# Patient Record
Sex: Male | Born: 1970 | Race: Black or African American | Hispanic: No | Marital: Married | Smoking: Former smoker
Health system: Southern US, Community
[De-identification: ages and names within clinical notes are randomized; demographics above are authoritative.]

## PROBLEM LIST (undated history)

## (undated) DIAGNOSIS — I10 Essential (primary) hypertension: Secondary | ICD-10-CM

## (undated) DIAGNOSIS — G35 Multiple sclerosis: Secondary | ICD-10-CM

## (undated) DIAGNOSIS — M542 Cervicalgia: Secondary | ICD-10-CM

## (undated) DIAGNOSIS — F32A Depression, unspecified: Secondary | ICD-10-CM

## (undated) DIAGNOSIS — S065XAA Traumatic subdural hemorrhage with loss of consciousness status unknown, initial encounter: Secondary | ICD-10-CM

## (undated) DIAGNOSIS — F329 Major depressive disorder, single episode, unspecified: Secondary | ICD-10-CM

## (undated) DIAGNOSIS — Q761 Klippel-Feil syndrome: Secondary | ICD-10-CM

## (undated) DIAGNOSIS — D509 Iron deficiency anemia, unspecified: Secondary | ICD-10-CM

## (undated) DIAGNOSIS — G709 Myoneural disorder, unspecified: Secondary | ICD-10-CM

## (undated) DIAGNOSIS — G5620 Lesion of ulnar nerve, unspecified upper limb: Secondary | ICD-10-CM

## (undated) DIAGNOSIS — T7840XA Allergy, unspecified, initial encounter: Secondary | ICD-10-CM

## (undated) DIAGNOSIS — M5412 Radiculopathy, cervical region: Secondary | ICD-10-CM

## (undated) DIAGNOSIS — F419 Anxiety disorder, unspecified: Secondary | ICD-10-CM

## (undated) DIAGNOSIS — F1410 Cocaine abuse, uncomplicated: Secondary | ICD-10-CM

## (undated) DIAGNOSIS — D649 Anemia, unspecified: Secondary | ICD-10-CM

## (undated) DIAGNOSIS — F1290 Cannabis use, unspecified, uncomplicated: Secondary | ICD-10-CM

## (undated) HISTORY — DX: Anxiety disorder, unspecified: F41.9

## (undated) HISTORY — DX: Myoneural disorder, unspecified: G70.9

## (undated) HISTORY — PX: SPINAL FUSION: SHX223

## (undated) HISTORY — DX: Depression, unspecified: F32.A

## (undated) HISTORY — DX: Allergy, unspecified, initial encounter: T78.40XA

---
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
## 2003-02-02 HISTORY — PX: CRANIOPLASTY: SUR330

## 2005-07-04 ENCOUNTER — Emergency Department: Payer: Self-pay | Admitting: Emergency Medicine

## 2008-03-27 ENCOUNTER — Emergency Department: Payer: Self-pay | Admitting: Internal Medicine

## 2008-04-06 ENCOUNTER — Emergency Department: Payer: Self-pay | Admitting: Emergency Medicine

## 2009-05-24 ENCOUNTER — Emergency Department: Payer: Self-pay | Admitting: Emergency Medicine

## 2009-11-11 ENCOUNTER — Emergency Department: Payer: Self-pay | Admitting: Emergency Medicine

## 2010-07-02 ENCOUNTER — Ambulatory Visit: Payer: Self-pay

## 2010-07-07 ENCOUNTER — Ambulatory Visit: Payer: Self-pay

## 2012-01-04 ENCOUNTER — Ambulatory Visit: Payer: Self-pay | Admitting: Neurology

## 2012-02-02 HISTORY — PX: NECK SURGERY: SHX720

## 2012-02-02 HISTORY — PX: CERVICAL FUSION: SHX112

## 2013-05-09 ENCOUNTER — Ambulatory Visit: Payer: Self-pay | Admitting: Internal Medicine

## 2013-05-17 ENCOUNTER — Ambulatory Visit: Payer: Self-pay | Admitting: Internal Medicine

## 2013-05-28 ENCOUNTER — Ambulatory Visit: Payer: Self-pay | Admitting: Neurology

## 2013-05-28 LAB — CSF CELL COUNT WITH DIFFERENTIAL
CSF Tube #: 3
Eosinophil: 0 %
Lymphocytes: 0 %
Monocytes/Macrophages: 0 %
Neutrophils: 0 %
Other Cells: 0 %
RBC (CSF): 0 /mm3
WBC (CSF): 0 /mm3

## 2013-05-28 LAB — CBC WITH DIFFERENTIAL/PLATELET
Basophil #: 0.1 10*3/uL (ref 0.0–0.1)
Basophil %: 0.8 %
Eosinophil #: 0.3 10*3/uL (ref 0.0–0.7)
Eosinophil %: 4.4 %
HCT: 44.6 % (ref 40.0–52.0)
HGB: 15.1 g/dL (ref 13.0–18.0)
Lymphocyte #: 2.2 10*3/uL (ref 1.0–3.6)
Lymphocyte %: 33.7 %
MCH: 30.5 pg (ref 26.0–34.0)
MCHC: 33.8 g/dL (ref 32.0–36.0)
MCV: 90 fL (ref 80–100)
Monocyte #: 0.4 x10 3/mm (ref 0.2–1.0)
Monocyte %: 6.5 %
Neutrophil #: 3.6 10*3/uL (ref 1.4–6.5)
Neutrophil %: 54.6 %
Platelet: 210 10*3/uL (ref 150–440)
RBC: 4.94 10*6/uL (ref 4.40–5.90)
RDW: 13.1 % (ref 11.5–14.5)
WBC: 6.6 10*3/uL (ref 3.8–10.6)

## 2013-05-28 LAB — COMPREHENSIVE METABOLIC PANEL
Albumin: 3.7 g/dL (ref 3.4–5.0)
Alkaline Phosphatase: 79 U/L
Anion Gap: 3 — ABNORMAL LOW (ref 7–16)
BUN: 10 mg/dL (ref 7–18)
Bilirubin,Total: 0.3 mg/dL (ref 0.2–1.0)
Calcium, Total: 9 mg/dL (ref 8.5–10.1)
Chloride: 103 mmol/L (ref 98–107)
Co2: 33 mmol/L — ABNORMAL HIGH (ref 21–32)
Creatinine: 0.96 mg/dL (ref 0.60–1.30)
EGFR (African American): >60
EGFR (Non-African Amer.): >60
Glucose: 55 mg/dL — ABNORMAL LOW (ref 65–99)
Osmolality: 274 (ref 275–301)
Potassium: 3.6 mmol/L (ref 3.5–5.1)
SGOT(AST): 36 U/L (ref 15–37)
SGPT (ALT): 55 U/L (ref 12–78)
Sodium: 139 mmol/L (ref 136–145)
Total Protein: 8 g/dL (ref 6.4–8.2)

## 2013-05-28 LAB — GLUCOSE, CSF: Glucose, CSF: 56 mg/dL (ref 40–75)

## 2013-05-28 LAB — PROTIME-INR
INR: 0.9
Prothrombin Time: 12.3 secs (ref 11.5–14.7)

## 2013-05-28 LAB — PROTEIN, CSF: Protein, CSF: 42 mg/dL (ref 15–45)

## 2013-05-28 LAB — APTT: Activated PTT: 29.9 secs (ref 23.6–35.9)

## 2013-06-07 DIAGNOSIS — G35 Multiple sclerosis: Secondary | ICD-10-CM | POA: Insufficient documentation

## 2013-06-22 ENCOUNTER — Other Ambulatory Visit: Payer: Self-pay | Admitting: Neurosurgery

## 2013-06-22 DIAGNOSIS — M542 Cervicalgia: Secondary | ICD-10-CM

## 2013-06-28 ENCOUNTER — Ambulatory Visit
Admission: RE | Admit: 2013-06-28 | Discharge: 2013-06-28 | Disposition: A | Discharge status: Home / Self Care | Payer: BC Managed Care – PPO | Source: Ambulatory Visit | Attending: Neurosurgery | Admitting: Neurosurgery

## 2013-06-28 VITALS — BP 102/60 | HR 51

## 2013-06-28 DIAGNOSIS — M542 Cervicalgia: Secondary | ICD-10-CM

## 2013-06-28 MED ORDER — DIAZEPAM 5 MG PO TABS
10.0000 mg | ORAL_TABLET | Freq: Once | ORAL | Status: AC
  Administered 2013-06-28: 10 mg via ORAL

## 2013-06-28 MED ORDER — HYDROMORPHONE HCL PF 1 MG/ML IJ SOLN
1.0000 mg | Freq: Once | INTRAMUSCULAR | Status: AC
  Administered 2013-06-28: 1 mg via INTRAMUSCULAR

## 2013-06-28 MED ORDER — ONDANSETRON HCL 4 MG/2ML IJ SOLN
4.0000 mg | Freq: Once | INTRAMUSCULAR | Status: AC
  Administered 2013-06-28: 4 mg via INTRAMUSCULAR

## 2013-06-28 MED ORDER — IOHEXOL 300 MG/ML  SOLN
10.0000 mL | Freq: Once | INTRAMUSCULAR | Status: AC | PRN
  Administered 2013-06-28: 10 mL via INTRATHECAL

## 2013-06-28 NOTE — Discharge Instructions (Signed)

## 2013-09-07 ENCOUNTER — Emergency Department: Payer: Self-pay | Admitting: Emergency Medicine

## 2013-09-11 ENCOUNTER — Ambulatory Visit: Payer: Self-pay | Admitting: Neurosurgery

## 2013-10-16 ENCOUNTER — Ambulatory Visit: Payer: Self-pay | Admitting: Pain Medicine

## 2014-01-05 ENCOUNTER — Emergency Department: Payer: Self-pay | Admitting: Student

## 2014-01-05 LAB — BASIC METABOLIC PANEL
Anion Gap: 6 — ABNORMAL LOW (ref 7–16)
BUN: 10 mg/dL (ref 7–18)
Calcium, Total: 8.1 mg/dL — ABNORMAL LOW (ref 8.5–10.1)
Chloride: 106 mmol/L (ref 98–107)
Co2: 27 mmol/L (ref 21–32)
Creatinine: 0.99 mg/dL (ref 0.60–1.30)
EGFR (African American): >60
EGFR (Non-African Amer.): >60
Glucose: 104 mg/dL — ABNORMAL HIGH (ref 65–99)
Osmolality: 277 (ref 275–301)
Potassium: 3.6 mmol/L (ref 3.5–5.1)
Sodium: 139 mmol/L (ref 136–145)

## 2014-01-05 LAB — URINALYSIS, COMPLETE
Bacteria: NONE SEEN
Bilirubin,UR: NEGATIVE
Blood: NEGATIVE
Glucose,UR: NEGATIVE mg/dL (ref 0–75)
Ketone: NEGATIVE
Leukocyte Esterase: NEGATIVE
Nitrite: NEGATIVE
Ph: 6 (ref 4.5–8.0)
Protein: NEGATIVE
RBC,UR: NONE SEEN /HPF (ref 0–5)
Specific Gravity: 1.015 (ref 1.003–1.030)
Squamous Epithelial: NONE SEEN
WBC UR: <1 /HPF (ref 0–5)

## 2014-01-05 LAB — DRUG SCREEN, URINE
Amphetamines, Ur Screen: NEGATIVE (ref ?–1000)
Barbiturates, Ur Screen: NEGATIVE (ref ?–200)
Benzodiazepine, Ur Scrn: NEGATIVE (ref ?–200)
Cannabinoid 50 Ng, Ur ~~LOC~~: POSITIVE (ref ?–50)
Cocaine Metabolite,Ur ~~LOC~~: NEGATIVE (ref ?–300)
MDMA (Ecstasy)Ur Screen: NEGATIVE (ref ?–500)
Methadone, Ur Screen: NEGATIVE (ref ?–300)
Opiate, Ur Screen: NEGATIVE (ref ?–300)
Phencyclidine (PCP) Ur S: NEGATIVE (ref ?–25)
Tricyclic, Ur Screen: NEGATIVE (ref ?–1000)

## 2014-01-05 LAB — TROPONIN I: Troponin-I: <0.02 ng/mL

## 2014-01-05 LAB — CBC
HCT: 39.7 % — ABNORMAL LOW (ref 40.0–52.0)
HGB: 13.3 g/dL (ref 13.0–18.0)
MCH: 30.9 pg (ref 26.0–34.0)
MCHC: 33.6 g/dL (ref 32.0–36.0)
MCV: 92 fL (ref 80–100)
Platelet: 232 10*3/uL (ref 150–440)
RBC: 4.32 10*6/uL — ABNORMAL LOW (ref 4.40–5.90)
RDW: 13.4 % (ref 11.5–14.5)
WBC: 8.7 10*3/uL (ref 3.8–10.6)

## 2014-01-05 LAB — PRO B NATRIURETIC PEPTIDE: B-Type Natriuretic Peptide: 40 pg/mL (ref 0–125)

## 2015-02-12 ENCOUNTER — Other Ambulatory Visit: Payer: Self-pay

## 2015-02-12 ENCOUNTER — Emergency Department: Payer: Self-pay

## 2015-02-12 ENCOUNTER — Encounter: Payer: Self-pay | Admitting: Urgent Care

## 2015-02-12 ENCOUNTER — Emergency Department
Admission: EM | Admit: 2015-02-12 | Discharge: 2015-02-12 | Disposition: A | Discharge status: Home / Self Care | Payer: Self-pay | Attending: Emergency Medicine | Admitting: Emergency Medicine

## 2015-02-12 DIAGNOSIS — Z880 Allergy status to penicillin: Secondary | ICD-10-CM | POA: Insufficient documentation

## 2015-02-12 DIAGNOSIS — F17200 Nicotine dependence, unspecified, uncomplicated: Secondary | ICD-10-CM | POA: Insufficient documentation

## 2015-02-12 DIAGNOSIS — R079 Chest pain, unspecified: Secondary | ICD-10-CM | POA: Insufficient documentation

## 2015-02-12 HISTORY — DX: Multiple sclerosis: G35

## 2015-02-12 LAB — BASIC METABOLIC PANEL
Anion gap: 6 (ref 5–15)
BUN: 16 mg/dL (ref 6–20)
CO2: 25 mmol/L (ref 22–32)
Calcium: 8.4 mg/dL — ABNORMAL LOW (ref 8.9–10.3)
Chloride: 105 mmol/L (ref 101–111)
Creatinine, Ser: 0.86 mg/dL (ref 0.61–1.24)
GFR calc Af Amer: >60 mL/min (ref >60)
GFR calc non Af Amer: >60 mL/min (ref >60)
Glucose, Bld: 90 mg/dL (ref 65–99)
Potassium: 3.7 mmol/L (ref 3.5–5.1)
Sodium: 136 mmol/L (ref 135–145)

## 2015-02-12 LAB — CBC
HCT: 37.8 % — ABNORMAL LOW (ref 40.0–52.0)
Hemoglobin: 13.1 g/dL (ref 13.0–18.0)
MCH: 30.8 pg (ref 26.0–34.0)
MCHC: 34.6 g/dL (ref 32.0–36.0)
MCV: 89.2 fL (ref 80.0–100.0)
Platelets: 186 10*3/uL (ref 150–440)
RBC: 4.23 MIL/uL — ABNORMAL LOW (ref 4.40–5.90)
RDW: 12.8 % (ref 11.5–14.5)
WBC: 6.2 10*3/uL (ref 3.8–10.6)

## 2015-02-12 LAB — TROPONIN I: Troponin I: <0.03 ng/mL (ref <0.031)

## 2015-02-12 MED ORDER — OXYCODONE-ACETAMINOPHEN 5-325 MG PO TABS: 2.0000 | ORAL_TABLET | Freq: Four times a day (QID) | ORAL | Status: DC | PRN

## 2015-02-12 NOTE — ED Provider Notes (Signed)
[facility] Emergency Department Provider Note     Time seen: ----------------------------------------- 9:55 PM on 02/12/2015 -----------------------------------------    I have reviewed the triage vital signs and the nursing notes.   HISTORY  Chief Complaint Chest Pain    HPI Joshua [name] is a 45 y.o. male who presents to ER with left-sided chest pain that began earlier today while at rest. Lambert Mody, nothing makes it better or worse. He denies shortness of breath, nausea vomiting or sweats. Pain comes in waves and is severe, this is happened many times before today. He has not had a specific diagnosis with it before   Past Medical History  Diagnosis Date  . Multiple sclerosis (HCC)     There are no active problems to display for this patient.   Past Surgical History  Procedure Laterality Date  . Spinal fusion      Allergies Penicillins  Social History Social History  Substance Use Topics  . Smoking status: Current Every Day Smoker  . Smokeless tobacco: None  . Alcohol Use: No    Review of Systems Constitutional: Negative for fever. Eyes: Negative for visual changes. ENT: Negative for sore throat. Cardiovascular: Positive for chest pain Respiratory: Negative for shortness of breath. Gastrointestinal: Negative for abdominal pain, vomiting and diarrhea. Genitourinary: Negative for dysuria. Musculoskeletal: Negative for back pain. Skin: Negative for rash. Neurological: Negative for headaches, focal weakness or numbness.  10-point ROS otherwise negative.  ____________________________________________   PHYSICAL EXAM:  VITAL SIGNS: ED Triage Vitals  Enc Vitals Group     BP 02/12/15 2053 98/60 mmHg     Pulse Rate 02/12/15 2053 57     Resp --      Temp 02/12/15 2053 98 F (36.7 C)     Temp Source 02/12/15 2053 Oral     SpO2 02/12/15 2053 97 %     Weight 02/12/15 2053 140 lb (63.504 kg)     Height 02/12/15 2053   (1.702 m)     Head Cir --      Peak Flow --      Pain Score 02/12/15 2045 10     Pain Loc --      Pain Edu? --      Excl. in GC? --     Constitutional: Alert and oriented. Well appearing and in no distress. Eyes: Conjunctivae are normal. PERRL. Normal extraocular movements. ENT   Head: Normocephalic and atraumatic.   Nose: No congestion/rhinnorhea.   Mouth/Throat: Mucous membranes are moist.   Neck: No stridor. Cardiovascular: Normal rate, regular rhythm. Normal and symmetric distal pulses are present in all extremities. No murmurs, rubs, or gallops. Respiratory: Normal respiratory effort without tachypnea nor retractions. Breath sounds are clear and equal bilaterally. No wheezes/rales/rhonchi. Gastrointestinal: Soft and nontender. No distention. No abdominal bruits.  Musculoskeletal: Nontender with normal range of motion in all extremities. No joint effusions.  No lower extremity tenderness nor edema. Neurologic:  Normal speech and language. No gross focal neurologic deficits are appreciated. Speech is normal. No gait instability. Skin:  Skin is warm, dry and intact. No rash noted. Psychiatric: Mood and affect are normal. Speech and behavior are normal. Patient exhibits appropriate insight and judgment. ____________________________________________  EKG: Interpreted by me. Normal sinus rhythm with a rate of 59 bpm, first-degree AV block, normal QRS, normal QT interval. No evidence of hypertrophy or acute infarction.  ____________________________________________  ED COURSE:  Pertinent labs & imaging results that were available during my care of the patient  were reviewed by me and considered in my medical decision making (see chart for details). Patient is in no acute distress, will check cardiac labs and reevaluate. ____________________________________________    LABS (pertinent positives/negatives)  Labs Reviewed  BASIC METABOLIC PANEL - Abnormal; Notable for the  following:    Calcium 8.4 (*)    All other components within normal limits  CBC - Abnormal; Notable for the following:    RBC 4.23 (*)    HCT 37.8 (*)    All other components within normal limits  TROPONIN I    RADIOLOGY  Chest x-ray IMPRESSION: No active cardiopulmonary disease. ____________________________________________  FINAL ASSESSMENT AND PLAN  Nonspecific Chest pain  Plan: Patient with labs and imaging as dictated above. Patient is low risk for ACS and Heart score is reassuring. This been going on for some time with an unclear etiology. Pain is likely musculoskeletal or urgent. I do not see an association between this and MS. He'll be given pain medicine and encouraged to have close follow-up with his doctor for evaluation   Emily Filbert, MD   Emily Filbert, MD 02/15/15 [phone]

## 2015-02-12 NOTE — Discharge Instructions (Signed)

## 2015-02-12 NOTE — ED Notes (Addendum)
Patient presents with c/o LEFT side CP that began earlier today while at rest. Patient denies N/V, SOB, and diaphoresis. Pain comes in waves and is reported to be a "12" when it happens.

## 2017-05-16 ENCOUNTER — Other Ambulatory Visit: Payer: Self-pay

## 2017-05-16 ENCOUNTER — Encounter: Payer: Self-pay | Admitting: Emergency Medicine

## 2017-05-16 ENCOUNTER — Emergency Department
Admission: EM | Admit: 2017-05-16 | Discharge: 2017-05-16 | Disposition: A | Discharge status: Home / Self Care | Payer: BLUE CROSS/BLUE SHIELD | Attending: Emergency Medicine | Admitting: Emergency Medicine

## 2017-05-16 DIAGNOSIS — Z79899 Other long term (current) drug therapy: Secondary | ICD-10-CM | POA: Diagnosis not present

## 2017-05-16 DIAGNOSIS — F17210 Nicotine dependence, cigarettes, uncomplicated: Secondary | ICD-10-CM | POA: Diagnosis not present

## 2017-05-16 DIAGNOSIS — G35 Multiple sclerosis: Secondary | ICD-10-CM | POA: Diagnosis not present

## 2017-05-16 DIAGNOSIS — M542 Cervicalgia: Secondary | ICD-10-CM | POA: Diagnosis present

## 2017-05-16 MED ORDER — SODIUM CHLORIDE 0.9 % IV SOLN
1000.0000 mg | Freq: Once | INTRAVENOUS | Status: AC
  Administered 2017-05-16: 1000 mg via INTRAVENOUS
  Filled 2017-05-16: qty 8

## 2017-05-16 MED ORDER — OXYCODONE-ACETAMINOPHEN 5-325 MG PO TABS
1.0000 | ORAL_TABLET | Freq: Once | ORAL | Status: AC
  Administered 2017-05-16: 1 via ORAL
  Filled 2017-05-16: qty 1

## 2017-05-16 NOTE — ED Triage Notes (Signed)
Pt ambulatory to triage with slow steady gait, no distress noted. Pt reports 3 mechanical falls today due to legs giving out. Since pt has had pain from left side of neck down back and into left leg. Pt has hx/o MS and this has happened in past but not with pain post injury. Pt denies LOC or hitting head.

## 2017-05-16 NOTE — ED Provider Notes (Signed)
[facility] Emergency Department Provider Note  ____________________________________________  Time seen: Approximately 10:18 PM  I have reviewed the triage vital signs and the nursing notes.   HISTORY  Chief Complaint Fall    HPI Joshua [name] is a 47 y.o. male presents to the emergency department with 10 out of 10 chronic pain due to MS.  Patient reports that he has been taking gabapentin and dimethyl fumarate and medications have not been helping.  Patient reports that he wakes up every day in pain.  Patient reports that he refuses to take a disability check and is happy to support his family.  Patient reports that he is not suicidal but reports that he is "tired of living".  Patient reports that he is "so so tired".  Patient adamantly denies suicidal ideation during 3 instances of this emergency department encounter interview.  Patient reports that he has had 3 mechanical falls today that he attributes to worsening pain secondary to MS.  Past Medical History:  Diagnosis Date  . Multiple sclerosis (HCC)     There are no active problems to display for this patient.   Past Surgical History:  Procedure Laterality Date  . SPINAL FUSION      Prior to Admission medications   Medication Sig Start Date End Date Taking? Authorizing Provider  oxyCODONE-acetaminophen (PERCOCET) 5-325 MG tablet Take 2 tablets by mouth every 6 (six) hours as needed for moderate pain or severe pain. 02/12/15   Emily Filbert, MD    Allergies Penicillins  History reviewed. No pertinent family history.  Social History Social History   Tobacco Use  . Smoking status: Current Every Day Smoker  . Smokeless tobacco: Never Used  Substance Use Topics  . Alcohol use: No  . Drug use: Not on file     Review of Systems  Constitutional: No fever/chills Eyes: No visual changes. No discharge ENT: No upper respiratory complaints. Cardiovascular: no chest pain. Respiratory:  no cough. No SOB. Gastrointestinal: No abdominal pain.  No nausea, no vomiting.  No diarrhea.  No constipation. Musculoskeletal: Patient has chronic musculoskeletal pain. Skin: Negative for rash, abrasions, lacerations, ecchymosis. Neurological: Negative for headaches, focal weakness or numbness.   ____________________________________________   PHYSICAL EXAM:  VITAL SIGNS: ED Triage Vitals [05/16/17 1903]  Enc Vitals Group     BP (!) 158/81     Pulse Rate 85     Resp 17     Temp 98.2 F (36.8 C)     Temp Source Oral     SpO2 99 %     Weight 140 lb (63.5 kg)     Height      Head Circumference      Peak Flow      Pain Score 6     Pain Loc      Pain Edu?      Excl. in GC?      Constitutional: Alert and oriented. Well appearing and in no acute distress. Eyes: Conjunctivae are normal. PERRL. EOMI. Head: Atraumatic. ENT:      Ears:       Nose: No congestion/rhinnorhea.      Mouth/Throat: Mucous membranes are moist.  Neck: No stridor.  No cervical spine tenderness to palpation. Cardiovascular: Normal rate, regular rhythm. Normal S1 and S2.  Good peripheral circulation. Respiratory: Normal respiratory effort without tachypnea or retractions. Lungs CTAB. Good air entry to the bases with no decreased or absent breath sounds. Gastrointestinal: Bowel sounds 4 quadrants. Soft and  nontender to palpation. No guarding or rigidity. No palpable masses. No distention. No CVA tenderness. Musculoskeletal: Full range of motion to all extremities. No gross deformities appreciated. Neurologic:  Normal speech and language.  Skin:  Skin is warm, dry and intact. No rash noted. Psychiatric: Mood and affect are normal. Speech and behavior are normal. Patient exhibits appropriate insight and judgement.   ____________________________________________   LABS (all labs ordered are listed, but only abnormal results are displayed)  Labs Reviewed - No data to  display ____________________________________________  EKG   ____________________________________________  RADIOLOGY   No results found.  ____________________________________________    PROCEDURES  Procedure(s) performed:    Procedures    Medications  methylPREDNISolone sodium succinate (SOLU-MEDROL) 1,000 mg in sodium chloride 0.9 % 50 mL IVPB (1,000 mg Intravenous New Bag/Given 05/16/17 2145)     ____________________________________________   INITIAL IMPRESSION / ASSESSMENT AND PLAN / ED COURSE  Pertinent labs & imaging results that were available during my care of the patient were reviewed by me and considered in my medical decision making (see chart for details).  Review of the [place] CSRS was performed in accordance of the [facility] prior to dispensing any controlled drugs.     Assessment and plan MS Patient presents to the emergency department with multiple sclerosis.  patient's case was discussed with Dr. Marisa Severin.  Patient underwent a verbal contract for safety and adamantly denied suicidal ideation or plan.  Patient received at thousand milligrams of Solu-Medrol in the emergency department as part of once monthly treatment given evidence-based management for multiple sclerosis.  After patient's history was reviewed and the [facility] drug database, he was also given a Percocet.  Vital signs are reassuring prior to discharge.  All patient questions were answered.    ____________________________________________  FINAL CLINICAL IMPRESSION(S) / ED DIAGNOSES  Final diagnoses:  None      NEW MEDICATIONS STARTED DURING THIS VISIT:  ED Discharge Orders    None          This chart was dictated using voice recognition software/Dragon. Despite best efforts to proofread, errors can occur which can change the meaning. Any change was purely unintentional.    Orvil Feil, PA-C 05/16/17 2257    Myrna Blazer, MD 05/17/17 1116

## 2017-05-16 NOTE — ED Notes (Signed)
Pt did not stay to sign discharge. Pt cautioned prior to discharge not to drive self home. Pt's mother called to provide ride.

## 2017-06-10 ENCOUNTER — Other Ambulatory Visit: Payer: Self-pay | Admitting: Neurology

## 2017-06-10 DIAGNOSIS — G35 Multiple sclerosis: Secondary | ICD-10-CM

## 2017-06-22 ENCOUNTER — Ambulatory Visit: Admission: RE | Admit: 2017-06-22 | Payer: BLUE CROSS/BLUE SHIELD | Source: Ambulatory Visit

## 2017-06-22 ENCOUNTER — Ambulatory Visit: Payer: BLUE CROSS/BLUE SHIELD

## 2017-07-20 ENCOUNTER — Ambulatory Visit: Payer: Self-pay | Admitting: Urology

## 2018-07-17 ENCOUNTER — Other Ambulatory Visit: Payer: Self-pay

## 2018-07-17 ENCOUNTER — Encounter: Payer: Self-pay | Admitting: Emergency Medicine

## 2018-07-17 ENCOUNTER — Emergency Department
Admission: EM | Admit: 2018-07-17 | Discharge: 2018-07-17 | Disposition: A | Discharge status: Home / Self Care | Payer: 59 | Attending: Student in an Organized Health Care Education/Training Program | Admitting: Student in an Organized Health Care Education/Training Program

## 2018-07-17 DIAGNOSIS — G35 Multiple sclerosis: Secondary | ICD-10-CM | POA: Diagnosis not present

## 2018-07-17 DIAGNOSIS — F17200 Nicotine dependence, unspecified, uncomplicated: Secondary | ICD-10-CM | POA: Insufficient documentation

## 2018-07-17 DIAGNOSIS — G1221 Amyotrophic lateral sclerosis: Secondary | ICD-10-CM | POA: Diagnosis not present

## 2018-07-17 DIAGNOSIS — M542 Cervicalgia: Secondary | ICD-10-CM | POA: Diagnosis present

## 2018-07-17 HISTORY — DX: Cervicalgia: M54.2

## 2018-07-17 MED ORDER — HYDROMORPHONE HCL 1 MG/ML IJ SOLN
1.0000 mg | Freq: Once | INTRAMUSCULAR | Status: AC
  Administered 2018-07-17: 11:00:00 1 mg via INTRAMUSCULAR
  Filled 2018-07-17: qty 1

## 2018-07-17 MED ORDER — SODIUM CHLORIDE 0.9 % IV BOLUS: 1000.0000 mL | Freq: Once | INTRAVENOUS | Status: DC

## 2018-07-17 MED ORDER — SODIUM CHLORIDE 0.9 % IV SOLN: 1000.0000 mg | Freq: Once | INTRAVENOUS | Status: DC

## 2018-07-17 MED ORDER — OXYCODONE-ACETAMINOPHEN 7.5-325 MG PO TABS: 1.0000 | ORAL_TABLET | Freq: Four times a day (QID) | ORAL | 0 refills | Status: DC | PRN

## 2018-07-17 MED ORDER — SODIUM CHLORIDE 0.9 % IV SOLN
1000.0000 mg | Freq: Once | INTRAVENOUS | Status: AC
  Administered 2018-07-17: 12:00:00 1000 mg via INTRAVENOUS
  Filled 2018-07-17: qty 8

## 2018-07-17 NOTE — ED Provider Notes (Signed)
[name] Provider Note   ____________________________________________   First MD Initiated Contact with Patient 07/17/18 1026     (approximate)  I have reviewed the triage vital signs and the nursing notes.   HISTORY  Chief Complaint Neck Pain    HPI Joshua [name] is a 48 y.o. male patient complains of pain secondary to MS and also chronic neck pain and had a spinal fusion.  Patient was seen at this facility last year for same complaint but but discontinue follow-up with neurosurgeon.  Patient has a referral to a new neurosurgeon for his neck pain and MS.  Patient rates his pain as a 9/10.  Patient described the pain as "sharp".  Patient state currently taking gabapentin for complaint.  Patient state medications not helping.  Patient denies recent injury.      Past Medical History:  Diagnosis Date  . Multiple sclerosis (HCC)   . Neck pain     There are no active problems to display for this patient.   Past Surgical History:  Procedure Laterality Date  . SPINAL FUSION      Prior to Admission medications   Medication Sig Start Date End Date Taking? Authorizing Provider  oxyCODONE-acetaminophen (PERCOCET) 7.5-325 MG tablet Take 1 tablet by mouth every 6 (six) hours as needed. 07/17/18   Joni Reining, PA-C    Allergies Penicillins  No family history on file.  Social History Social History   Tobacco Use  . Smoking status: Current Every Day Smoker  . Smokeless tobacco: Never Used  Substance Use Topics  . Alcohol use: No  . Drug use: Not on file    Review of Systems Constitutional: No fever/chills Eyes: No visual changes. ENT: No sore throat. Cardiovascular: Denies chest pain. Respiratory: Denies shortness of breath. Gastrointestinal: No abdominal pain.  No nausea, no vomiting.  No diarrhea.  No constipation. Genitourinary: Negative for dysuria. Musculoskeletal: Negative for back pain. Skin: Negative  for rash. Neurological: Negative for headaches, focal weakness or numbness.   ____________________________________________   PHYSICAL EXAM:  VITAL SIGNS: ED Triage Vitals  Enc Vitals Group     BP 07/17/18 0945 (!) 149/130     Pulse Rate 07/17/18 0945 69     Resp 07/17/18 0945 20     Temp 07/17/18 0945 97.8 F (36.6 C)     Temp Source 07/17/18 0945 Oral     SpO2 07/17/18 0945 99 %     Weight 07/17/18 0942 150 lb (68 kg)     Height 07/17/18 0942 5\' 7"  (1.702 m)     Head Circumference --      Peak Flow --      Pain Score 07/17/18 0942 9     Pain Loc --      Pain Edu? --      Excl. in GC? --    Constitutional: Alert and oriented. Well appearing and in no acute distress. Eyes: Conjunctivae are normal. PERRL. EOMI. Head: Atraumatic. Nose: No congestion/rhinnorhea. Mouth/Throat: Mucous membranes are moist.  Oropharynx non-erythematous. Neck: No cervical spine tenderness to palpation.  Decreased range of motion with flexion and lateral movements. Hematological/Lymphatic/Immunilogical: No cervical lymphadenopathy. Cardiovascular: Normal rate, regular rhythm. Grossly normal heart sounds.  Good peripheral circulation. Respiratory: Normal respiratory effort.  No retractions. Lungs CTAB. Gastrointestinal: Soft and nontender. No distention. No abdominal bruits. No CVA tenderness. Musculoskeletal: No lower extremity tenderness nor edema.  No joint effusions. Neurologic:  Normal speech and language. No gross focal neurologic  deficits are appreciated. No gait instability. Skin:  Skin is warm, dry and intact. No rash noted. Psychiatric: Mood and affect are normal. Speech and behavior are normal.  ____________________________________________   LABS (all labs ordered are listed, but only abnormal results are displayed)  Labs Reviewed - No data to display ____________________________________________  EKG   ____________________________________________  RADIOLOGY  ED MD  interpretation:    Official radiology report(s): No results found.  ____________________________________________   PROCEDURES  Procedure(s) performed (including Critical Care):  Procedures   ____________________________________________   INITIAL IMPRESSION / ASSESSMENT AND PLAN / ED COURSE  As part of my medical decision making, I reviewed the following data within the [place]         Patient presents to emergency department with mild sclerosis.  Patient also has chronic neck pain status post fusion.  Patient has not follow-up with neurosurgery as directed.  Patient received Solu-Medrol IV and given a prescription for Percocet on discharge.  Patient referred to neurosurgery for continued care.      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    Sable Feil, PA-C 07/17/18 1214    Merlyn Lot, MD 07/17/18 1224

## 2018-07-17 NOTE — ED Triage Notes (Signed)
Pt c/o neck pain. Pt denies injuries and states that he has chronic neck pain. Pt reports was seen here last year for the same, is not followed by a MD and needs something for the pain.

## 2018-07-17 NOTE — Discharge Instructions (Signed)
Advised to call neurosurgeon on discharge instruction sheet to schedule appointment for definitive care

## 2018-07-17 NOTE — ED Notes (Signed)
See triage note  Presents with neck pain  States he has a hx of MS and DDD   States in his neck has gotten worse  Was seen last year for same  Was given solumedrol and then placed on dose pack  States he got a lot of relief at that time

## 2018-11-28 ENCOUNTER — Encounter: Payer: Self-pay | Admitting: Emergency Medicine

## 2018-11-28 ENCOUNTER — Emergency Department
Admission: EM | Admit: 2018-11-28 | Discharge: 2018-11-28 | Disposition: A | Discharge status: Home / Self Care | Payer: 59 | Attending: Emergency Medicine | Admitting: Emergency Medicine

## 2018-11-28 ENCOUNTER — Emergency Department: Payer: 59

## 2018-11-28 ENCOUNTER — Other Ambulatory Visit: Payer: Self-pay

## 2018-11-28 DIAGNOSIS — F17200 Nicotine dependence, unspecified, uncomplicated: Secondary | ICD-10-CM | POA: Insufficient documentation

## 2018-11-28 DIAGNOSIS — G35 Multiple sclerosis: Secondary | ICD-10-CM | POA: Insufficient documentation

## 2018-11-28 DIAGNOSIS — M62830 Muscle spasm of back: Secondary | ICD-10-CM

## 2018-11-28 DIAGNOSIS — M546 Pain in thoracic spine: Secondary | ICD-10-CM | POA: Diagnosis present

## 2018-11-28 MED ORDER — ORPHENADRINE CITRATE 30 MG/ML IJ SOLN
60.0000 mg | Freq: Two times a day (BID) | INTRAMUSCULAR | Status: DC
  Administered 2018-11-28: 60 mg via INTRAMUSCULAR
  Filled 2018-11-28: qty 2

## 2018-11-28 MED ORDER — CYCLOBENZAPRINE HCL 5 MG PO TABS: ORAL_TABLET | ORAL | 0 refills | Status: DC

## 2018-11-28 MED ORDER — MELOXICAM 7.5 MG PO TABS: 7.5000 mg | ORAL_TABLET | Freq: Every day | ORAL | 0 refills | Status: DC

## 2018-11-28 MED ORDER — LIDOCAINE 5 % EX PTCH
1.0000 | MEDICATED_PATCH | CUTANEOUS | Status: DC
  Administered 2018-11-28: 12:00:00 1 via TRANSDERMAL
  Filled 2018-11-28: qty 1

## 2018-11-28 MED ORDER — PREDNISONE 10 MG PO TABS: ORAL_TABLET | ORAL | 0 refills | Status: DC

## 2018-11-28 MED ORDER — LIDOCAINE 5 % EX PTCH: 1.0000 | MEDICATED_PATCH | CUTANEOUS | 0 refills | Status: DC

## 2018-11-28 NOTE — ED Provider Notes (Signed)
[facility] Emergency Department Provider Note  ____________________________________________  Time seen: Approximately 11:20 AM  I have reviewed the triage vital signs and the nursing notes.   HISTORY  Chief Complaint Back Pain    HPI Joshua [name] L Goggins is a 48 y.o. male that presents to the emergency department for evaluation of chronic pain secondary to MS and increasing mid back pain.  Patient states that back pain worsened when he bent over to pick something up under the sink.  He states that pain feels like a muscle spasm.  He has seen Dr. Sherryll BurgerShah in the past but would like a referral to a new neurosurgeon.  He was evaluated for similar in the emergency department 4 months ago.  Patient has regular follow-up with primary care.  He has not followed up with neurology.  No recent illness.  No fevers, headaches, shortness of breath, chest pain, cough.   Past Medical History:  Diagnosis Date  . Multiple sclerosis (HCC)   . Neck pain     There are no active problems to display for this patient.   Past Surgical History:  Procedure Laterality Date  . SPINAL FUSION      Prior to Admission medications   Medication Sig Start Date End Date Taking? Authorizing Provider  cyclobenzaprine (FLEXERIL) 5 MG tablet Take 1-2 tablets 3 times daily as needed 11/28/18   Enid DerryWagner, Tashawna Thom, PA-C  lidocaine (LIDODERM) 5 % Place 1 patch onto the skin daily. Remove & Discard patch within 12 hours or as directed by MD 11/28/18   Enid DerryWagner, Elick Aguilera, PA-C  meloxicam (MOBIC) 7.5 MG tablet Take 1 tablet (7.5 mg total) by mouth daily. 11/28/18 11/28/19  Enid DerryWagner, Kaidence Callaway, PA-C  oxyCODONE-acetaminophen (PERCOCET) 7.5-325 MG tablet Take 1 tablet by mouth every 6 (six) hours as needed. 07/17/18   Joni ReiningSmith, Ronald K, PA-C  predniSONE (DELTASONE) 10 MG tablet Take 6 tablets day 1, take 5 tablets day 2, take 4 tablets day 3, take 3 tablets day 4, take 2 tablets day 5, take 1 tablet day 6 11/28/18   Enid DerryWagner,  Nejla Reasor, PA-C    Allergies Penicillins  No family history on file.  Social History Social History   Tobacco Use  . Smoking status: Current Every Day Smoker  . Smokeless tobacco: Never Used  Substance Use Topics  . Alcohol use: No  . Drug use: Not on file     Review of Systems  Constitutional: No fever/chills ENT: No upper respiratory complaints. Cardiovascular: No chest pain. Respiratory: No cough. No SOB. Gastrointestinal: No nausea, no vomiting.  Musculoskeletal: Positive for back pain. Skin: Negative for rash, abrasions, lacerations, ecchymosis. Neurological: Negative for headaches   ____________________________________________   PHYSICAL EXAM:  VITAL SIGNS: ED Triage Vitals [11/28/18 1054]  Enc Vitals Group     BP 133/72     Pulse Rate 91     Resp 20     Temp 99 F (37.2 C)     Temp Source Oral     SpO2 99 %     Weight 150 lb (68 kg)     Height 5\' 7"  (1.702 m)     Head Circumference      Peak Flow      Pain Score 8     Pain Loc      Pain Edu?      Excl. in GC?      Constitutional: Alert and oriented. Well appearing and in no acute distress. Eyes: Conjunctivae are normal. PERRL. EOMI. Head:  Atraumatic. ENT:      Ears:      Nose: No congestion/rhinnorhea.      Mouth/Throat: Mucous membranes are moist.  Neck: No stridor. No cervical spine tenderness to palpation. Cardiovascular: Normal rate, regular rhythm.  Good peripheral circulation. Respiratory: Normal respiratory effort without tachypnea or retractions. Lungs CTAB. Good air entry to the bases with no decreased or absent breath sounds. Gastrointestinal: Bowel sounds 4 quadrants. Soft and nontender to palpation. No guarding or rigidity. No palpable masses. No distention.  Musculoskeletal: Full range of motion to all extremities. No gross deformities appreciated.  No tenderness to palpation over thoracic or lumbar spine.  Mild tenderness to palpation to right upper back.  Strength equal in upper  extremities bilaterally.  Normal gait. Neurologic:  Normal speech and language. No gross focal neurologic deficits are appreciated.  Skin:  Skin is warm, dry and intact. No rash noted. Psychiatric: Mood and affect are normal. Speech and behavior are normal. Patient exhibits appropriate insight and judgement.   ____________________________________________   LABS (all labs ordered are listed, but only abnormal results are displayed)  Labs Reviewed - No data to display ____________________________________________  EKG   ____________________________________________  RADIOLOGY Lexine Baton, personally viewed and evaluated these images (plain radiographs) as part of my medical decision making, as well as reviewing the written report by the radiologist.  Dg Chest 2 View  Result Date: 11/28/2018 CLINICAL DATA:  Back pain EXAM: CHEST - 2 VIEW COMPARISON:  02/12/2015 FINDINGS: The heart size and mediastinal contours are within normal limits. Both lungs are clear. Spinal fixation hardware within the lower cervical spine. Osseous structures of the bony thorax appear intact. IMPRESSION: No active cardiopulmonary disease. Electronically Signed   By: Duanne Guess M.D.   On: 11/28/2018 11:50    ____________________________________________    PROCEDURES  Procedure(s) performed:    Procedures    Medications  orphenadrine (NORFLEX) injection 60 mg (60 mg Intramuscular Given 11/28/18 1204)  lidocaine (LIDODERM) 5 % 1 patch (1 patch Transdermal Patch Applied 11/28/18 1204)     ____________________________________________   INITIAL IMPRESSION / ASSESSMENT AND PLAN / ED COURSE  Pertinent labs & imaging results that were available during my care of the patient were reviewed by me and considered in my medical decision making (see chart for details).  Review of the [place] CSRS was performed in accordance of the [facility] prior to dispensing any controlled drugs.   Patient presented to  emergency department for evaluation of mid back spasms yesterday.  Vital signs and exam are reassuring.  Exam is consistent with muscle spasm.  X-ray negative for acute cardiopulmonary processes.  He was given Norflex and Lidoderm patch.  Patient would like to try a short course of steroids for his MS pain.  He was encouraged to follow-up with neurology.  Patient will be discharged home with prescriptions for prednisone, Flexeril, Mobic.  Patient is to follow up with neurology as directed. Patient is given ED precautions to return to the ED for any worsening or new symptoms.  RONAK DUQUETTE was evaluated in Emergency Department on 11/28/2018 for the symptoms described in the history of present illness. He was evaluated in the context of the global COVID-19 pandemic, which necessitated consideration that the patient might be at risk for infection with the SARS-CoV-2 virus that causes COVID-19. Institutional protocols and algorithms that pertain to the evaluation of patients at risk for COVID-19 are in a state of rapid change based on information released by regulatory bodies  including the CDC and federal and state organizations. These policies and algorithms were followed during the patient's care in the ED.   ____________________________________________  FINAL CLINICAL IMPRESSION(S) / ED DIAGNOSES  Final diagnoses:  Muscle spasm of back  Multiple sclerosis (HCC)      NEW MEDICATIONS STARTED DURING THIS VISIT:  ED Discharge Orders         Ordered    predniSONE (DELTASONE) 10 MG tablet  Status:  Discontinued     11/28/18 1301    cyclobenzaprine (FLEXERIL) 5 MG tablet     11/28/18 1301    meloxicam (MOBIC) 7.5 MG tablet  Daily     11/28/18 1301    predniSONE (DELTASONE) 10 MG tablet     11/28/18 1302    lidocaine (LIDODERM) 5 %  Every 24 hours     11/28/18 1303              This chart was dictated using voice recognition software/Dragon. Despite best efforts to proofread,  errors can occur which can change the meaning. Any change was purely unintentional.    Laban Emperor, PA-C 11/28/18 1825    Arta Silence, MD 11/29/18 [phone]

## 2018-11-28 NOTE — ED Triage Notes (Signed)
Pt reports pain in his upper back. Pt states it happens frequently and he isn't sure if it is his MS or he pulled a muscle. Pt does not recall doing anything that may have strained it. Pt denies all other sx's.

## 2018-11-28 NOTE — ED Notes (Signed)
See triage note  Presents with pain to mid/upper back  Denies any injury  States he has a hx of MS and not sure if it is a muscle spasm

## 2018-11-29 ENCOUNTER — Encounter: Payer: Self-pay | Admitting: Neurology

## 2019-01-02 NOTE — Progress Notes (Signed)
NEUROLOGY CONSULTATION NOTE  Joshua [name] MRN: [id number] DOB: November 11, 1970  Referring provider: Dionne Bucy, MD Primary care provider: Franco Nones, FNP  Reason for consult:  Multiple sclerosis  HISTORY OF PRESENT ILLNESS: Joshua [name] is a 48 year old left-handed African American male who presents for multiple sclerosis.  History supplemented by prior neurology and referring provider notes.    Initially presented with hand numbness and diffuse pain.  MRI of brain and cervical cord at the time revealed numerous periventricular and cervical spinal cord lesions.  He did not follow up with neurology and continued to experience diffuse pain, arm and hand numbness, unsteady gait with falls and "MS hug".  Vision:  No issues Motor:  No weakness Sensory:  Paresthesias involving all extremities.  NCV-EMG from 05/27/2014 showed right ulnar neuropathy Pain:  Chronic diffuse pain.  Chronic neck pain.  Chronic back pain.  Chronic chest pain Gait:  Unsteady gait.   Bowel/Bladder:  No issues Fatigue:  Daytime fatigue.  Poor sleep Cognition:  Reports short term memory deficits.  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 Mood:  Depressed Erectile dysfunction  Other pertinent history: 2005 Subdural hematoma: "spontaneous bleed" in setting of cocaine use and high blood pressure s/p surgery on the left skull region.  07/12/2013 ACDF C4-5 and C6-7.  Headaches resolved.  Still with neck pain. Right ulnar neuropathy on NCV-EMG 05/27/2014. History of alcoholism and drug addiction.  Family History:  Sister (diagnosed with MS in her 30s)  Current disease modifying therapy:  none Past disease modifying therapy:  Tecfidera (reports that it didn't work)  Current medications:  none Past medications:  Cymbalta 30mg  daily; gabapentin  Imaging: 11/13/2009 MRI BRAIN W WO:  Multiple periventricular, deep and subcortical white matter lesions, including  characteristic perpendicular lesion adjacent to the right ventricle. 11/13/2009 MRI CERVICAL & THORACIC SPINE W WO:  T2/STIR hyperintense lesions in the cervical medullary junction, C5, T1-2, T7-8, and T9  vertebral levels without evidence of enhancement.  Multilevel degenerative changes of the cervical spine. 07/07/2010 MRI CERVICAL SPINE W WO (personally reviewed):  Nonenhancing lesions at C1 and C5 levels. 01/04/2012 MRI BRAIN W WO (personally reviewed):  Two tiny nonspecific nonenhancing hyperintense foci in subcortical white matter. 05/17/2013 MRI CERVICAL SPINE WO:  Multiple nonenhancing lesions within spinal cord, reportedly stable compared to imaging from 07/07/2010.  Spondylosis with right paracentral disc herniation without neural compression at C3-4, broad based disc protrusion with bilateral neural foraminal stenosis compressing C7 nerve roots at C6-7, and left-sided facet arthropathy with edema at C7-T1 09/11/2013 MRI CERVICAL SPINE WO:  S/p ACDF at C4-5 and C6-7, increased disc herniation and spinal stenosis at C3-4, chronjic left facet arthropathy at C7-T1. 06/07/2014 MRI BRAIN W WO: Multiple T2 hyperintense lesions within periventricular and subcortical white matter without abnormal enhancement. 06/07/2014 MRI BRAIN W WO: Multiple T2 hyperintense peripheral cervical spinal cord lesions from the level of the C1 arch to the T2 level without abnormal enhancement.  Status post C4-C7 ACDF.  Small disc bulge at C3-C4 resulting in mild spinal canal stenosis.  Labs: 04/22/2014 positive JC Virus Ab with index 1.28 Reportedly underwent lumbar puncture for CSF analysis, revealing 10 oligoclonal bands.   PAST MEDICAL HISTORY: Past Medical History:  Diagnosis Date  . Multiple sclerosis (HCC)   . Neck pain     PAST SURGICAL HISTORY: Past Surgical History:  Procedure Laterality Date  . SPINAL FUSION      MEDICATIONS: Current Outpatient  Medications on File Prior to Visit  Medication Sig Dispense  Refill  . cyclobenzaprine (FLEXERIL) 5 MG tablet Take 1-2 tablets 3 times daily as needed 20 tablet 0  . lidocaine (LIDODERM) 5 % Place 1 patch onto the skin daily. Remove & Discard patch within 12 hours or as directed by MD 30 patch 0  . meloxicam (MOBIC) 7.5 MG tablet Take 1 tablet (7.5 mg total) by mouth daily. 7 tablet 0  . oxyCODONE-acetaminophen (PERCOCET) 7.5-325 MG tablet Take 1 tablet by mouth every 6 (six) hours as needed. 20 tablet 0  . predniSONE (DELTASONE) 10 MG tablet Take 6 tablets day 1, take 5 tablets day 2, take 4 tablets day 3, take 3 tablets day 4, take 2 tablets day 5, take 1 tablet day 6 21 tablet 0   No current facility-administered medications on file prior to visit.     ALLERGIES: Allergies  Allergen Reactions  . Penicillins Hives    As a child     FAMILY HISTORY: Sister:  Multiple sclerosis  SOCIAL HISTORY: Social History   Socioeconomic History  . Marital status: Married    Spouse name: Not on file  . Number of children: Not on file  . Years of education: Not on file  . Highest education level: Not on file  Occupational History  . Not on file  Social Needs  . Financial resource strain: Not on file  . Food insecurity    Worry: Not on file    Inability: Not on file  . Transportation needs    Medical: Not on file    Non-medical: Not on file  Tobacco Use  . Smoking status: Current Every Day Smoker  . Smokeless tobacco: Never Used  Substance and Sexual Activity  . Alcohol use: No  . Drug use: Not on file  . Sexual activity: Not on file  Lifestyle  . Physical activity    Days per week: Not on file    Minutes per session: Not on file  . Stress: Not on file  Relationships  . Social Musician on phone: Not on file    Gets together: Not on file    Attends religious service: Not on file    Active member of club or organization: Not on file    Attends meetings of clubs or organizations: Not on file    Relationship status: Not on  file  . Intimate partner violence    Fear of current or ex partner: Not on file    Emotionally abused: Not on file    Physically abused: Not on file    Forced sexual activity: Not on file  Other Topics Concern  . Not on file  Social History Narrative  . Not on file    REVIEW OF SYSTEMS: Constitutional: No fevers, chills, or sweats, no generalized fatigue, change in appetite Eyes: No visual changes, double vision, eye pain Ear, nose and throat: No hearing loss, ear pain, nasal congestion, sore throat Cardiovascular: No chest pain, palpitations Respiratory:  No shortness of breath at rest or with exertion, wheezes GastrointestinaI: No nausea, vomiting, diarrhea, abdominal pain, fecal incontinence Genitourinary:  Erectile dysfunction.  No dysuria, urinary retention or frequency Musculoskeletal:  Diffuse pain Integumentary: No rash, pruritus, skin lesions Neurological: as above Psychiatric: anxiety, depression Endocrine: No palpitations, fatigue, diaphoresis, mood swings, change in appetite, change in weight, increased thirst Hematologic/Lymphatic:  No purpura, petechiae. Allergic/Immunologic: no itchy/runny eyes, nasal congestion, recent allergic reactions, rashes  PHYSICAL EXAM:  Blood pressure (!) 145/67, pulse 79, height 5\' 7"  (1.702 m), weight 150 lb 6.4 oz (68.2 kg), SpO2 99 %. General: No acute distress.  Patient appears well-groomed.   Head:  Normocephalic/atraumatic Eyes:  fundi examined but not visualized Neck: supple, no paraspinal tenderness, full range of motion Back: No paraspinal tenderness Heart: regular rate and rhythm Lungs: Clear to auscultation bilaterally. Vascular: No carotid bruits. Neurological Exam: Mental status: alert and oriented to person, place, and time, recent and remote memory intact, fund of knowledge intact, attention and concentration intact, speech fluent and not dysarthric, language intact. Cranial nerves: CN I: not tested CN II: pupils  equal, round and reactive to light, visual fields intact CN III, IV, VI:  full range of motion, no nystagmus, no ptosis CN V: facial sensation intact CN VII: upper and lower face symmetric CN VIII: hearing intact CN IX, X: gag intact, uvula midline CN XI: sternocleidomastoid and trapezius muscles intact CN XII: tongue midline Bulk & Tone: normal, no fasciculations. Motor:  5/5 throughout Sensation: temperature and vibration sensation intact.. Deep Tendon Reflexes:  3+ throughout, toes downgoing.  Finger to nose testing:  Without dysmetria.  Heel to shin:  Without dysmetria.  Gait:  Normal station and stride.  Able to turn and tandem walk. Romberg negative.  IMPRESSION: Multiple sclerosis Chronic pain syndrome MS-related fatigue Chronic neck pain s/p ACDF  PLAN: 1.  Plan to initiate Ocrevus 2.  We will check MRI of brain and cervical spine with and without contrast.  Prescribed Valium.  Instructed that he needs a driver 3.  Modafinil 100mg  every morning for daytime somnolence related MS 4.  Due to his significant pain, requiring ED visits, and having failed gabapentin and Cymbalta, I will refer him to pain management 5.  We will check CBC with diff, CMP, vitamin D level, TB Quantiferon Gold, and Hepatitis B panel. 6.  Follow up in 6 months.  Thank you for allowing me to take part in the care of this patient.  Metta Clines, DO  CC: Arta Silence, MD  Threasa Alpha, FNP

## 2019-01-03 ENCOUNTER — Ambulatory Visit (INDEPENDENT_AMBULATORY_CARE_PROVIDER_SITE_OTHER): Payer: 59 | Admitting: Neurology

## 2019-01-03 ENCOUNTER — Encounter: Payer: Self-pay | Admitting: Neurology

## 2019-01-03 ENCOUNTER — Other Ambulatory Visit: Payer: Self-pay

## 2019-01-03 ENCOUNTER — Telehealth: Payer: Self-pay

## 2019-01-03 ENCOUNTER — Other Ambulatory Visit: Payer: 59

## 2019-01-03 VITALS — BP 145/67 | HR 79 | Ht 67.0 in | Wt 150.4 lb

## 2019-01-03 DIAGNOSIS — G35 Multiple sclerosis: Secondary | ICD-10-CM

## 2019-01-03 DIAGNOSIS — G894 Chronic pain syndrome: Secondary | ICD-10-CM | POA: Diagnosis not present

## 2019-01-03 DIAGNOSIS — R5383 Other fatigue: Secondary | ICD-10-CM

## 2019-01-03 LAB — CBC WITH DIFFERENTIAL/PLATELET
Basophils Absolute: 0 10*3/uL (ref 0.0–0.1)
Basophils Relative: 0.6 % (ref 0.0–3.0)
Eosinophils Absolute: 0.2 10*3/uL (ref 0.0–0.7)
Eosinophils Relative: 2.9 % (ref 0.0–5.0)
HCT: 43.9 % (ref 39.0–52.0)
Hemoglobin: 15 g/dL (ref 13.0–17.0)
Lymphocytes Relative: 30.4 % (ref 12.0–46.0)
Lymphs Abs: 2 10*3/uL (ref 0.7–4.0)
MCHC: 34.2 g/dL (ref 30.0–36.0)
MCV: 90 fl (ref 78.0–100.0)
Monocytes Absolute: 0.5 10*3/uL (ref 0.1–1.0)
Monocytes Relative: 7.2 % (ref 3.0–12.0)
Neutro Abs: 4 10*3/uL (ref 1.4–7.7)
Neutrophils Relative %: 58.9 % (ref 43.0–77.0)
Platelets: 256 10*3/uL (ref 150.0–400.0)
RBC: 4.87 Mil/uL (ref 4.22–5.81)
RDW: 13.4 % (ref 11.5–15.5)
WBC: 6.7 10*3/uL (ref 4.0–10.5)

## 2019-01-03 LAB — VITAMIN D 25 HYDROXY (VIT D DEFICIENCY, FRACTURES): VITD: 17.37 ng/mL — ABNORMAL LOW (ref 30.00–100.00)

## 2019-01-03 MED ORDER — PREGABALIN 50 MG PO CAPS: 50.0000 mg | ORAL_CAPSULE | Freq: Three times a day (TID) | ORAL | 5 refills | Status: DC

## 2019-01-03 MED ORDER — MODAFINIL 100 MG PO TABS: 100.0000 mg | ORAL_TABLET | Freq: Every day | ORAL | 5 refills | Status: DC

## 2019-01-03 MED ORDER — DIAZEPAM 5 MG PO TABS: ORAL_TABLET | ORAL | 0 refills | Status: DC

## 2019-01-03 NOTE — Telephone Encounter (Signed)
Pt was seen in office today.  Copy of his insurance card was not collected by the front desk.  Patient has new insurance [employer]. A copy of this card is needed for submission of Ocrevus infusion start form for PA.  Pt will be a new start for infusion.  Called patient to request  Copy of card no answer left message to call office back.

## 2019-01-03 NOTE — Patient Instructions (Addendum)
1.  We will check MRI of brain and cervical spine with and without contrast.  I will prescribe you a Valium.  Take 30 to 40 minutes prior to MRI.  You must have a driver to and from the facility. 2.  For pain, I will also refer you to pain management. 3.  For fatigue, I will prescribe you modafinil 100mg  every morning. 4.  We will check CBC with diff, CMP, vitamin D level, TB Quantiferon Gold, and Hepatitis B panel. 5.  We will start you on Ocrevus. 6.  Follow up in 6 months.  Your provider has requested that you have labwork completed today. Please go to [facility] ([id number]) on the second floor of this building before leaving the office today. You do not need to check in. If you are not called within 15 minutes please check with the front desk.    A referral to [facility] has been placed for your MRI someone will contact you directly to schedule your appt. They are located at [place]. Please contact them directly by calling [phone] with any questions regarding your referral.

## 2019-01-05 LAB — COMPLETE METABOLIC PANEL WITH GFR
AG Ratio: 1.5 (calc) (ref 1.0–2.5)
ALT: 13 U/L (ref 9–46)
AST: 18 U/L (ref 10–35)
Albumin: 4.2 g/dL (ref 3.6–5.1)
Alkaline phosphatase (APISO): 53 U/L (ref 35–144)
BUN: 8 mg/dL (ref 7–25)
CO2: 28 mmol/L (ref 20–32)
Calcium: 9.4 mg/dL (ref 8.6–10.3)
Chloride: 105 mmol/L (ref 98–110)
Creat: 0.84 mg/dL (ref 0.70–1.33)
GFR, Est African American: 118 mL/min/{1.73_m2} (ref >=60)
GFR, Est Non African American: 102 mL/min/{1.73_m2} (ref >=60)
Globulin: 2.8 g/dL (calc) (ref 1.9–3.7)
Glucose, Bld: 89 mg/dL (ref 65–99)
Potassium: 5.1 mmol/L (ref 3.5–5.3)
Sodium: 140 mmol/L (ref 135–146)
Total Bilirubin: 0.3 mg/dL (ref 0.2–1.2)
Total Protein: 7 g/dL (ref 6.1–8.1)

## 2019-01-05 LAB — QUANTIFERON-TB GOLD PLUS
Mitogen-NIL: >10 IU/mL
NIL: 0.02 IU/mL
QuantiFERON-TB Gold Plus: NEGATIVE
TB1-NIL: 0.02 IU/mL
TB2-NIL: 0.01 IU/mL

## 2019-01-05 LAB — HEPATITIS B SURFACE ANTIBODY,QUALITATIVE: Hep B S Ab: NONREACTIVE

## 2019-01-08 ENCOUNTER — Telehealth: Payer: Self-pay

## 2019-01-08 NOTE — Telephone Encounter (Signed)
Also spoke with patient he will send e-mail to starla of copy of insurance card that wasn't ask for during his appt.  starla is aware to give me a copy when he sends it to her.   This card is needs to complete his PA for Ocrevus infusion.

## 2019-01-08 NOTE — Telephone Encounter (Signed)
-----   Message from Pieter Partridge, DO sent at 01/07/2019  2:34 PM EST ----- Labs are back.  Vitamin D level is low.  I would like him to start over the counter D3 4000 IU daily.  We can proceed and schedule for Ocrevus infusion.

## 2019-01-08 NOTE — Telephone Encounter (Signed)
Called patient he was informed of results and understands will start Vitamin D OTC

## 2019-01-19 NOTE — Progress Notes (Signed)
ocrevus denied patient plan will not cover buy & bill with outpatient hospital facility. Pt will need to use specialty.  Resubmitted start form with specialty pharmacy. [facility] listed as specialty pharmacy.

## 2019-01-19 NOTE — Progress Notes (Signed)
Received fax from [facility] access solution regarding patient Ocrevus  Per pay insurance plan buy& bill is available  Called to intiate PA verbally at [phone]  Spoke with Lexine Baton D. At health plan. Clinical information is needed to complete PA. She request that office note be sent via fax to [phone]  Will fax notes today.  This PA is for the initial dose another PA will need to be completed for any dose there after.  Pending PA# [id number]

## 2019-02-13 ENCOUNTER — Other Ambulatory Visit: Payer: Self-pay

## 2019-02-13 ENCOUNTER — Ambulatory Visit
Admission: RE | Admit: 2019-02-13 | Discharge: 2019-02-13 | Disposition: A | Discharge status: Home / Self Care | Payer: 59 | Source: Ambulatory Visit | Attending: Neurology | Admitting: Neurology

## 2019-02-13 DIAGNOSIS — G35D Multiple sclerosis, unspecified: Secondary | ICD-10-CM

## 2019-02-13 DIAGNOSIS — G894 Chronic pain syndrome: Secondary | ICD-10-CM

## 2019-02-13 DIAGNOSIS — R5383 Other fatigue: Secondary | ICD-10-CM

## 2019-02-13 DIAGNOSIS — G35 Multiple sclerosis: Secondary | ICD-10-CM

## 2019-02-13 MED ORDER — GADOBENATE DIMEGLUMINE 529 MG/ML IV SOLN
15.0000 mL | Freq: Once | INTRAVENOUS | Status: AC | PRN
  Administered 2019-02-13: 15 mL via INTRAVENOUS

## 2019-02-14 ENCOUNTER — Telehealth: Payer: Self-pay

## 2019-02-14 ENCOUNTER — Telehealth: Payer: Self-pay | Admitting: Neurology

## 2019-02-14 NOTE — Telephone Encounter (Signed)
Patient continues to have pain in neck wants recommendations to call back on Thursday. Please advise, lots of pain

## 2019-02-14 NOTE — Telephone Encounter (Signed)
Patient states that someone called him from our office maybe about the MRI results

## 2019-02-15 ENCOUNTER — Other Ambulatory Visit: Payer: Self-pay | Admitting: Neurology

## 2019-02-15 MED ORDER — PREGABALIN 75 MG PO CAPS: 75.0000 mg | ORAL_CAPSULE | Freq: Two times a day (BID) | ORAL | 3 refills | Status: DC

## 2019-02-15 NOTE — Telephone Encounter (Signed)
Patient advised and ask patient to call back in a few weeks with an update

## 2019-02-15 NOTE — Telephone Encounter (Signed)
I sent a prescription for Lyrica 75mg  twice daily to [facility] in [place] (listed as his pharmacy)

## 2019-02-22 ENCOUNTER — Other Ambulatory Visit: Payer: Self-pay

## 2019-02-22 DIAGNOSIS — G35 Multiple sclerosis: Secondary | ICD-10-CM

## 2019-03-13 ENCOUNTER — Telehealth: Payer: Self-pay | Admitting: *Deleted

## 2019-03-13 ENCOUNTER — Encounter: Payer: Self-pay | Admitting: Student in an Organized Health Care Education/Training Program

## 2019-03-13 NOTE — Progress Notes (Signed)
Patient: Joshua [name]  Service Category: E/M  Provider: Gillis Santa, MD  DOB: Jul 20, 1970  DOS: 03/14/2019  Referring Provider: Pieter Partridge, DO  MRN: [id number]  Setting: Ambulatory outpatient  PCP: Joshua Haggard, FNP  Type: New Patient  Specialty: Interventional Pain Management    Location: Office  Delivery: Face-to-face     Primary Reason(s) for Visit: Encounter for initial evaluation of one or more chronic problems (new to examiner) potentially causing chronic pain, and posing a threat to normal musculoskeletal function. (Level of risk: High) CC: Neck Pain  HPI  Joshua [name] is a 49 y.o. year old, male patient, who comes today to see [place] for the first time for an initial evaluation of his chronic pain. He has Cervical radiculopathy; Cervical fusion syndrome (C4-C7); Cocaine abuse ([place]); and MS (multiple sclerosis) ([place]) on their problem list. Today he comes in for evaluation of his Neck Pain  Pain Assessment: Location:   Neck(both elbow and generalizied) Radiating: Pain in his neck radiaties down shoulder to his fingers Onset: More than a month ago Duration: Chronic pain Quality: Aching, Burning, Stabbing, Tingling, Crushing, Discomfort, Sharp, Shooting Severity: 8 /10 (subjective, self-reported pain score)  Note: Reported level is inconsistent with clinical observations.  Effect on ADL: hold the back of his head while getting up in the morning, while turning his head he hears a crumbing and cracking sound, pain cause him to cry at times Timing: Constant Modifying factors: nothing BP: (!) 131/99  HR: 77  Onset and Duration: Gradual and Present longer than 3 months Cause of pain: MS, surgery Severity: Getting worse, NAS-11 at its worse: 10/10, NAS-11 at its best: .5/10, NAS-11 now: 8/10 and NAS-11 on the average: 7/10 Timing: Night Aggravating Factors: Motion Alleviating Factors: Medications Associated Problems: Tingling, Pain that wakes patient up and Pain that does not  allow patient to sleep Quality of Pain: Tingling Previous Examinations or Tests: MRI scan and Neurological evaluation Previous Treatments: Narcotic medications, Physical Therapy and Steroid treatments by mouth  The patient comes into the clinics today for the first time for a chronic pain management evaluation.   Joshua [name] is a 49 year old gentleman presents with a chief complaint of neck pain. He is status post a 2014 cervical fusion at C4-T1. He rates his pain daily 6/10 with intermittent 10/10 exacerbations. The neck pain radiates bilaterally down into his fingers. The pain is sharp, radiates and tingles, with numbness. He has tried tylenol, ibuprofen, topical lidocaine and physical therapy in the past, without pain relief. He has had relief with accupuncture, and IV Prednisone. He states the prednisone provided pain relief for 2 months. His medical history includes MS and migraines, with onset due to increasing neck pain. He was prescribed Lyrica in the past, but is unable to afford the medication and is not currently taking any analgesic medications.  Patient was referred by his neurologist for pain management.  Of note, upon chart review, patient has a history of cocaine abuse as well as substance abuse history.  He did not endorse this during his interview.  We will focus primarily on interventional pain management.   Historic Controlled Substance Pharmacotherapy Review   The patient  has no history on file for drug. List of all UDS Test(s): Lab Results  Component Value Date   MDMA NEGATIVE 01/05/2014   COCAINSCRNUR NEGATIVE 01/05/2014   PCPSCRNUR NEGATIVE 01/05/2014   THCU POSITIVE 01/05/2014   List of other Serum/Urine Drug Screening Test(s):  Lab Results  Component Value  Date   COCAINSCRNUR NEGATIVE 01/05/2014   THCU POSITIVE 01/05/2014   Historical Background Evaluation: [place] PMP: PDMP not reviewed this encounter. Six (6) year initial data search conducted.               safety, offender search: Editor, commissioning Information) Non-contributory Risk Assessment Profile: Aberrant behavior: None observed or detected today Risk factors for fatal opioid overdose: [name] 31-59 years old, history of substance abuse and nicotine dependence Fatal overdose hazard ratio (HR): Calculation deferred Non-fatal overdose hazard ratio (HR): Calculation deferred Risk of opioid abuse or dependence: 0.7-3.0% with doses ? 36 MME/day and 6.1-26% with doses ? 120 MME/day. Substance use disorder (SUD) risk level: High Personal History of Substance Abuse (SUD-Substance use disorder):  Alcohol: Negative  Illegal Drugs: Negative  Rx Drugs: Negative  ORT Risk Level calculation: Low Risk Opioid Risk Tool - 03/13/19 1419      Family History of Substance Abuse   Alcohol  Negative    Illegal Drugs  Negative    Rx Drugs  Negative      Personal History of Substance Abuse   Alcohol  Negative    Illegal Drugs  Negative    Rx Drugs  Negative      [name]   [name] between 5-45 years   No      History of Preadolescent Sexual Abuse   History of Preadolescent Sexual Abuse  Negative or Male      Psychological Disease   Psychological Disease  Negative    Depression  Negative      Total Score   Opioid Risk Tool Scoring  0    Opioid Risk Interpretation  Low Risk      ORT Scoring interpretation table:  Score <3 = Low Risk for SUD  Score between 4-7 = Moderate Risk for SUD  Score >8 = High Risk for Opioid Abuse   PHQ-2 Depression Scale:  Total score:    PHQ-2 Scoring interpretation table: (Score and probability of major depressive disorder)  Score 0 = No depression  Score 1 = 15.4% Probability  Score 2 = 21.1% Probability  Score 3 = 38.4% Probability  Score 4 = 45.5% Probability  Score 5 = 56.4% Probability  Score 6 = 78.6% Probability   PHQ-9 Depression Scale:  Total score:    PHQ-9 Scoring interpretation table:  Score 0-4 = No depression  Score 5-9 = Mild depression  Score 10-14 =  Moderate depression  Score 15-19 = Moderately severe depression  Score 20-27 = Severe depression (2.4 times higher risk of SUD and 2.89 times higher risk of overuse)   Pharmacologic Plan: Non-opioid analgesic therapy offered.  Focusing primarily on interventional pain management             Meds   Current Outpatient Medications:  Marland Kitchen  Multiple Vitamins-Minerals (MULTIVITAMIN WITH MINERALS) tablet, Take 1 tablet by mouth daily., Disp: , Rfl:  .  predniSONE (DELTASONE) 20 MG tablet, Take 3 tablets (60 mg total) by mouth daily with breakfast for 3 days, THEN 2 tablets (40 mg total) daily with breakfast for 3 days, THEN 1 tablet (20 mg total) daily with breakfast for 3 days., Disp: 18 tablet, Rfl: 0  Imaging Review  Cervical Imaging:  Results for orders placed during the hospital encounter of 02/13/19  MR CERVICAL SPINE W WO CONTRAST   Narrative CLINICAL DATA:  Initial evaluation for multiple sclerosis. History of prior cervical fusion.  EXAM: MRI HEAD WITHOUT AND WITH CONTRAST  MRI CERVICAL SPINE WITHOUT  AND WITH CONTRAST  TECHNIQUE: Multiplanar, multiecho pulse sequences of the brain and surrounding structures, and cervical spine, to include the craniocervical junction and cervicothoracic junction, were obtained without and with intravenous contrast.  CONTRAST:  51m MULTIHANCE GADOBENATE DIMEGLUMINE 529 MG/ML IV SOLN  COMPARISON:  Comparison made with prior MRI from 01/04/2012 as well as 09/11/2013.  FINDINGS: MRI HEAD FINDINGS  Brain: Mildly advanced cerebral atrophy for [name]. Few scattered subcentimeter foci of T2/FLAIR hyperintensity seen involving the periventricular and juxta cortical white matter of both cerebral hemispheres. A few of these foci are oriented perpendicular to the lateral ventricles (series 10, images 20, 19). Few scattered associated T1 black holes. No infratentorial foci identified. Appearance consistent with provided history of multiple  sclerosis. Overall, appearance is minimally progressed relative to 2013, with a few scattered new punctate foci seen, most evident at the anterior right frontal lobe (series 10, image 17). No associated restricted diffusion or enhancement to suggest active demyelination.  No evidence for acute or subacute infarct. Gray-white matter differentiation maintained. No encephalomalacia to suggest chronic cortical infarction. No evidence for acute or chronic intracranial hemorrhage.  No mass lesion, midline shift or mass effect. No hydrocephalus. No extra-axial fluid collection. Pituitary gland and suprasellar region within normal limits. Midline structures intact. No other abnormal enhancement within the brain.  Vascular: Major intracranial vascular flow voids are maintained.  Skull and upper cervical spine: Craniocervical junction within normal limits. Bone marrow signal intensity normal. No scalp soft tissue abnormality.  Sinuses/Orbits: Globes and orbital soft tissues within normal limits. Chronic right maxillary sinusitis noted. Paranasal sinuses are otherwise clear. Small bilateral mastoid effusions noted, of doubtful significance. Inner ear structures grossly normal. Visualized nasopharynx within normal limits.  Other: None.  MRI CERVICAL SPINE FINDINGS  Alignment: Straightening of the normal cervical lordosis. No listhesis.  Vertebrae: Susceptibility artifact from prior from ACDF at C4 through C7. There is solid arthrodesis at the C4-5 level. Vertebral body height maintained without evidence for acute or chronic fracture. Bone marrow signal intensity within normal limits. No discrete or worrisome osseous lesions. Mild reactive marrow edema seen about the left C7-T1 facet due to facet arthritis (series 18, image 11). No other abnormal marrow edema or enhancement.  Cord: Focal STIR signal abnormality seen within the central/left aspect of the cord at the level of C1 (series  18, image 7). Additional focal signal abnormality within the central and right dorsal cord at the level of C2-3 (series 19, image 2). Focal cord signal abnormality within the left dorsal cord at the level of C5 (series 19, image 13). Probable vague patchy signal abnormality within the upper thoracic spinal cord at the level of T1-2 (series 18, image 8). Findings consistent with multiple sclerosis. No associated enhancement to suggest active demyelination. Overall, appearance appears mildly progressed from previous.  Posterior Fossa, vertebral arteries, paraspinal tissues: Craniocervical junction within normal limits. Paraspinous and prevertebral soft tissues within normal limits. Normal intravascular flow voids seen within the vertebral arteries bilaterally.  Disc levels:  C2-C3: Shallow central disc protrusion indents the ventral thecal sac. Mild bilateral facet hypertrophy. No significant spinal stenosis. Foramina remain patent.  C3-C4: Moderate sized central disc protrusion indents the ventral thecal sac, contacting the ventral spinal cord. Superimposed mild ligament flavum thickening. Resultant moderate spinal stenosis with minimal cord flattening. Superimposed uncovertebral hypertrophy with resultant moderate right and mild left C4 foraminal stenosis.  C4-C5: Prior fusion. No residual spinal stenosis. Foramina are patent.  C5-C6: Prior fusion. No residual spinal stenosis. Foramina are  patent.  C6-C7: Prior fusion. No residual spinal stenosis. Foramina are patent.  C7-T1: Mild annular disc bulge. Advanced left-sided facet degeneration. No significant spinal stenosis. Foramina are patent.  IMPRESSION: MRI HEAD IMPRESSION:  1. Mild patchy T2/FLAIR signal abnormality involving the periventricular and juxta cortical supratentorial cerebral white matter, consistent with multiple sclerosis. Overall, appearance is mildly progressed relative to 2013. No evidence for  active demyelination. 2. Underlying mildly progressed cerebral atrophy for [name]. 3. Chronic right maxillary sinusitis.  MRI CERVICAL SPINE IMPRESSION:  1. Patchy multifocal cord signal abnormality involving the cervical spinal cord as above, consistent with history of multiple sclerosis. Overall, appearance is minimally progressed relative to 2015. No evidence for active demyelination. 2. Prior ACDF at C4 through C7 without residual stenosis. 3. 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   Electronically Signed   By: Jeannine Boga M.D.   On: 02/13/2019 19:43     Results for orders placed during the hospital encounter of 06/28/13  CT Cervical Spine W Contrast   Narrative CLINICAL DATA:  Multiple sclerosis. Cervical spondylosis. Left upper extremity pain and numbness. Pains in the left neck. Numbness throughout the left hand and forearm.  FLUOROSCOPY TIME:  35 seconds  PROCEDURE: LUMBAR PUNCTURE FOR CERVICAL MYELOGRAM  After thorough discussion of risks and benefits of the procedure including bleeding, infection, injury to nerves, blood vessels, adjacent structures as well as headache and CSF leak, written and oral informed consent was obtained. Consent was obtained by Dr. Lawrence Santiago. We discussed the high likelihood of obtaining a diagnostic study.  Patient was positioned prone on the fluoroscopy table. Local anesthesia was provided with 1% lidocaine without epinephrine after prepped and draped in the usual sterile fashion. Puncture was performed at L2-3 using a 3 1/2 inch 22-gauge spinal needle via right paramedian approach. Using a single pass through the dura, the needle was placed within the thecal sac, with return of clear CSF. 10 mL of Omnipaque-300 was injected into the thecal sac, with normal opacification of the nerve roots and cauda equina consistent with free flow within the subarachnoid  space. The patient was then moved to the trendelenburg position and contrast flowed into the Cervical spine region.  I personally performed the lumbar puncture and administered the intrathecal contrast. I also personally supervised acquisition of the myelogram images.  TECHNIQUE: Contiguous axial images were obtained through the Cervical spine after the intrathecal infusion of infusion. Coronal and sagittal reconstructions were obtained of the axial image sets.  FINDINGS: CERVICAL MYELOGRAM FINDINGS:  A prominent disc osteophyte complex is present at C4-5 and to a lesser extent at C5-6 and C6-7. There is some blunting of the nerve roots bilaterally at C4-5, worse on the left. There is mild blunting of the nerve roots at C5-6 and C6-7 is well. The right-sided nerve roots fill normally.  CT CERVICAL MYELOGRAM FINDINGS:  The cervical spine is visualized from the skullbase through T1-2. The craniocervical junction is within normal limits. Vertebral body heights and alignment are normal.  C2-3: A shallow central disc protrusion is present without significant stenosis.  C3-4: A prominent central disc protrusion is present. There is effacement of the ventral CSF without definite contact of the cord.  C4-5: A broad-based disc osteophyte complex is present. There is effacement of the ventral CSF. Mild to moderate left and mild right foraminal stenosis is present.  C5-6: A mild disc osteophyte complex is present. Uncovertebral spurring is present bilaterally. Mild foraminal narrowing  is worse on the left.  C6-7: A leftward disc osteophyte complex is present. Uncovertebral spurring is present bilaterally. Moderate left and mild right foraminal stenosis is present.  C7-T1:  Negative.  Bullous changes are present at the lung apices bilaterally.  IMPRESSION: 1. Mild to moderate left and mild right foraminal stenosis at C4-5 secondary to a broad-based disc osteophyte complex and  uncovertebral spurring. 2. Mild central canal narrowing is present at C4-5 C5-6, and C6-7. The disease is worse on the left. 3. Mild foraminal narrowing bilaterally at C5-6 worse on the right. 4. Moderate left and mild right foraminal stenosis at C6-7. 5. Mild central canal narrowing at C3-4.   Electronically Signed   By: Lawrence Santiago M.D.   On: 06/28/2013 14:55    Cervical DG Myelogram views:  Results for orders placed during the hospital encounter of 06/28/13  DG MYELOGRAPHY LUMBAR INJ CERVICAL   Narrative CLINICAL DATA:  Multiple sclerosis. Cervical spondylosis. Left upper extremity pain and numbness. Pains in the left neck. Numbness throughout the left hand and forearm.  FLUOROSCOPY TIME:  35 seconds  PROCEDURE: LUMBAR PUNCTURE FOR CERVICAL MYELOGRAM  After thorough discussion of risks and benefits of the procedure including bleeding, infection, injury to nerves, blood vessels, adjacent structures as well as headache and CSF leak, written and oral informed consent was obtained. Consent was obtained by Dr. Lawrence Santiago. We discussed the high likelihood of obtaining a diagnostic study.  Patient was positioned prone on the fluoroscopy table. Local anesthesia was provided with 1% lidocaine without epinephrine after prepped and draped in the usual sterile fashion. Puncture was performed at L2-3 using a 3 1/2 inch 22-gauge spinal needle via right paramedian approach. Using a single pass through the dura, the needle was placed within the thecal sac, with return of clear CSF. 10 mL of Omnipaque-300 was injected into the thecal sac, with normal opacification of the nerve roots and cauda equina consistent with free flow within the subarachnoid space. The patient was then moved to the trendelenburg position and contrast flowed into the Cervical spine region.  I personally performed the lumbar puncture and administered the intrathecal contrast. I also personally supervised  acquisition of the myelogram images.  TECHNIQUE: Contiguous axial images were obtained through the Cervical spine after the intrathecal infusion of infusion. Coronal and sagittal reconstructions were obtained of the axial image sets.  FINDINGS: CERVICAL MYELOGRAM FINDINGS:  A prominent disc osteophyte complex is present at C4-5 and to a lesser extent at C5-6 and C6-7. There is some blunting of the nerve roots bilaterally at C4-5, worse on the left. There is mild blunting of the nerve roots at C5-6 and C6-7 is well. The right-sided nerve roots fill normally.  CT CERVICAL MYELOGRAM FINDINGS:  The cervical spine is visualized from the skullbase through T1-2. The craniocervical junction is within normal limits. Vertebral body heights and alignment are normal.  C2-3: A shallow central disc protrusion is present without significant stenosis.  C3-4: A prominent central disc protrusion is present. There is effacement of the ventral CSF without definite contact of the cord.  C4-5: A broad-based disc osteophyte complex is present. There is effacement of the ventral CSF. Mild to moderate left and mild right foraminal stenosis is present.  C5-6: A mild disc osteophyte complex is present. Uncovertebral spurring is present bilaterally. Mild foraminal narrowing is worse on the left.  C6-7: A leftward disc osteophyte complex is present. Uncovertebral spurring is present bilaterally. Moderate left and mild right foraminal stenosis is  present.  C7-T1:  Negative.  Bullous changes are present at the lung apices bilaterally.  IMPRESSION: 1. Mild to moderate left and mild right foraminal stenosis at C4-5 secondary to a broad-based disc osteophyte complex and uncovertebral spurring. 2. Mild central canal narrowing is present at C4-5 C5-6, and C6-7. The disease is worse on the left. 3. Mild foraminal narrowing bilaterally at C5-6 worse on the right. 4. Moderate left and mild right  foraminal stenosis at C6-7. 5. Mild central canal narrowing at C3-4.   Electronically Signed   By: Lawrence Santiago M.D.   On: 06/28/2013 14:55     Lumbar DG Myelogram:  Results for orders placed during the hospital encounter of 06/28/13  DG MYELOGRAPHY LUMBAR INJ CERVICAL   Narrative CLINICAL DATA:  Multiple sclerosis. Cervical spondylosis. Left upper extremity pain and numbness. Pains in the left neck. Numbness throughout the left hand and forearm.  FLUOROSCOPY TIME:  35 seconds  PROCEDURE: LUMBAR PUNCTURE FOR CERVICAL MYELOGRAM  After thorough discussion of risks and benefits of the procedure including bleeding, infection, injury to nerves, blood vessels, adjacent structures as well as headache and CSF leak, written and oral informed consent was obtained. Consent was obtained by Dr. Lawrence Santiago. We discussed the high likelihood of obtaining a diagnostic study.  Patient was positioned prone on the fluoroscopy table. Local anesthesia was provided with 1% lidocaine without epinephrine after prepped and draped in the usual sterile fashion. Puncture was performed at L2-3 using a 3 1/2 inch 22-gauge spinal needle via right paramedian approach. Using a single pass through the dura, the needle was placed within the thecal sac, with return of clear CSF. 10 mL of Omnipaque-300 was injected into the thecal sac, with normal opacification of the nerve roots and cauda equina consistent with free flow within the subarachnoid space. The patient was then moved to the trendelenburg position and contrast flowed into the Cervical spine region.  I personally performed the lumbar puncture and administered the intrathecal contrast. I also personally supervised acquisition of the myelogram images.  TECHNIQUE: Contiguous axial images were obtained through the Cervical spine after the intrathecal infusion of infusion. Coronal and sagittal reconstructions were obtained of the axial image  sets.  FINDINGS: CERVICAL MYELOGRAM FINDINGS:  A prominent disc osteophyte complex is present at C4-5 and to a lesser extent at C5-6 and C6-7. There is some blunting of the nerve roots bilaterally at C4-5, worse on the left. There is mild blunting of the nerve roots at C5-6 and C6-7 is well. The right-sided nerve roots fill normally.  CT CERVICAL MYELOGRAM FINDINGS:  The cervical spine is visualized from the skullbase through T1-2. The craniocervical junction is within normal limits. Vertebral body heights and alignment are normal.  C2-3: A shallow central disc protrusion is present without significant stenosis.  C3-4: A prominent central disc protrusion is present. There is effacement of the ventral CSF without definite contact of the cord.  C4-5: A broad-based disc osteophyte complex is present. There is effacement of the ventral CSF. Mild to moderate left and mild right foraminal stenosis is present.  C5-6: A mild disc osteophyte complex is present. Uncovertebral spurring is present bilaterally. Mild foraminal narrowing is worse on the left.  C6-7: A leftward disc osteophyte complex is present. Uncovertebral spurring is present bilaterally. Moderate left and mild right foraminal stenosis is present.  C7-T1:  Negative.  Bullous changes are present at the lung apices bilaterally.  IMPRESSION: 1. Mild to moderate left and mild right foraminal stenosis  at C4-5 secondary to a broad-based disc osteophyte complex and uncovertebral spurring. 2. Mild central canal narrowing is present at C4-5 C5-6, and C6-7. The disease is worse on the left. 3. Mild foraminal narrowing bilaterally at C5-6 worse on the right. 4. Moderate left and mild right foraminal stenosis at C6-7. 5. Mild central canal narrowing at C3-4.   Electronically Signed   By: Lawrence Santiago M.D.   On: 06/28/2013 14:55     Complexity Note: Imaging results reviewed. Results shared with Joshua [name], using  Layman's terms.                         ROS  Cardiovascular: No reported cardiovascular signs or symptoms such as High blood pressure, coronary artery disease, abnormal heart rate or rhythm, heart attack, blood thinner therapy or heart weakness and/or failure Pulmonary or Respiratory: Smoking Neurological: No reported neurological signs or symptoms such as seizures, abnormal skin sensations, urinary and/or fecal incontinence, being born with an abnormal open spine and/or a tethered spinal cord, reports, MS, numbness, tingling, tremors of extremities, Psychological-Psychiatric: No reported psychological or psychiatric signs or symptoms such as difficulty sleeping, anxiety, depression, delusions or hallucinations (schizophrenial), mood swings (bipolar disorders) or suicidal ideations or attempts Gastrointestinal: No reported gastrointestinal signs or symptoms such as vomiting or evacuating blood, reflux, heartburn, alternating episodes of diarrhea and constipation, inflamed or scarred liver, or pancreas or irrregular and/or infrequent bowel movements Genitourinary: No reported renal or genitourinary signs or symptoms such as difficulty voiding or producing urine, peeing blood, non-functioning kidney, kidney stones, difficulty emptying the bladder, difficulty controlling the flow of urine, or chronic kidney disease Hematological: No reported hematological signs or symptoms such as prolonged bleeding, low or poor functioning platelets, bruising or bleeding easily, hereditary bleeding problems, low energy levels due to low hemoglobin or being anemic Endocrine: No reported endocrine signs or symptoms such as high or low blood sugar, rapid heart rate due to high thyroid levels, obesity or weight gain due to slow thyroid or thyroid disease Rheumatologic: No reported rheumatological signs and symptoms such as fatigue, redness, heat, with or without associated rash  Reports cervical joint pain, tenderness,  stiffness, decreased range of motion. Musculoskeletal: Multiple Sclerosis Work History: Employed at [employer] in [place], [place]  Allergies  Joshua [name] is allergic to penicillins.  Laboratory Chemistry Profile   Renal Lab Results  Component Value Date   BUN 8 01/03/2019   CREATININE 0.84 16/11/9602   BCR NOT APPLICABLE 54/10/8117   GFRAA 118 01/03/2019   GFRNONAA 102 01/03/2019   PROTEINUR Negative 01/05/2014    Electrolytes Lab Results  Component Value Date   NA 140 01/03/2019   K 5.1 01/03/2019   CL 105 01/03/2019   CALCIUM 9.4 01/03/2019    Hepatic Lab Results  Component Value Date   AST 18 01/03/2019   ALT 13 01/03/2019   ALBUMIN 3.7 05/28/2013   ALKPHOS 79 05/28/2013    ID No results found for: LYMEIGGIGMAB, HIV, SARSCOV2NAA, STAPHAUREUS, MRSAPCR, HCVAB, PREGTESTUR  Bone Lab Results  Component Value Date   VD25OH 17.37 (L) 01/03/2019    Endocrine Lab Results  Component Value Date   GLUCOSE 89 01/03/2019   GLUCOSEU Negative 01/05/2014    Neuropathy No results found for: VITAMINB12, FOLATE, HGBA1C, HIV  CNS No results found for: COLORCSF, APPEARCSF, RBCCOUNTCSF, WBCCSF, POLYSCSF, LYMPHSCSF, EOSCSF, PROTEINCSF, GLUCCSF, JCVIRUS, CSFOLI, IGGCSF, LABACHR, ACETBL  Inflammation (CRP: Acute  ESR: Chronic) No results found for: CRP,  ESRSEDRATE, LATICACIDVEN  Rheumatology No results found for: RF, ANA, LABURIC, URICUR, LYMEIGGIGMAB, LYMEABIGMQN, HLAB27  Coagulation Lab Results  Component Value Date   INR 0.9 05/28/2013   LABPROT 12.3 05/28/2013   APTT 29.9 05/28/2013   PLT 256.0 01/03/2019    Cardiovascular Lab Results  Component Value Date   BNP 40 01/05/2014   TROPONINI <0.03 02/12/2015   HGB 15.0 01/03/2019   HCT 43.9 01/03/2019    Screening No results found for: SARSCOV2NAA, COVIDSOURCE, STAPHAUREUS, MRSAPCR, HCVAB, HIV, PREGTESTUR  Cancer No results found for: CEA, CA125, LABCA2  Note: Lab results reviewed.  [place]  Drug: Joshua [name]   has no history on file for drug. Alcohol:  reports no history of alcohol use. Tobacco:  reports that he has been smoking. He has never used smokeless tobacco. Medical:  has a past medical history of Allergy, Anxiety, Depression, Multiple sclerosis ([place]), Neck pain, and Neuromuscular disorder ([place]). Family: family history includes Aneurysm in his brother; Hypertension in his father and mother; Multiple sclerosis in his sister.  Past Surgical History:  Procedure Laterality Date  . NECK SURGERY  20014  . SPINAL FUSION     Active Ambulatory Problems    Diagnosis Date Noted  . Cervical radiculopathy 03/14/2019  . 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 Cocaine abuse ([place]) 03/14/2019  . MS (multiple sclerosis) ([place]) 06/07/2013   Resolved Ambulatory Problems    Diagnosis Date Noted  . No Resolved Ambulatory Problems   Past Medical History:  Diagnosis Date  . Allergy   . Anxiety   . Depression   . Multiple sclerosis ([place])   . Neck pain   . Neuromuscular disorder ([place])    Constitutional Exam  General appearance: Well nourished, well developed, and well hydrated. In no apparent acute distress Vitals:   03/13/19 1418  BP: (!) 131/99  Pulse: 77  Temp: (!) 97.1 F (36.2 C)  SpO2: 98%  Weight: 145 lb (65.8 kg)  Height: 5' 7"  (1.702 m)   BMI Assessment: Estimated body mass index is 22.71 kg/m as calculated from the following:   Height as of this encounter: 5' 7"  (1.702 m).   Weight as of this encounter: 145 lb (65.8 kg).  BMI interpretation table: BMI level Category Range association with higher incidence of chronic pain  <18 kg/m2 Underweight   18.5-24.9 kg/m2 Ideal body weight   25-29.9 kg/m2 Overweight Increased incidence by 20%  30-34.9 kg/m2 Obese (Class I) Increased incidence by 68%  35-39.9 kg/m2 Severe obesity (Class II) Increased incidence by 136%  >40 kg/m2 Extreme obesity (Class III) Increased incidence by 254%   Patient's current BMI Ideal Body weight   Body mass index is 22.71 kg/m. Ideal body weight: 66.1 kg (145 lb 11.6 oz)   BMI Readings from Last 4 Encounters:  03/13/19 22.71 kg/m  01/03/19 23.56 kg/m  11/28/18 23.49 kg/m  07/17/18 23.49 kg/m   Wt Readings from Last 4 Encounters:  03/13/19 145 lb (65.8 kg)  01/03/19 150 lb 6.4 oz (68.2 kg)  11/28/18 150 lb (68 kg)  07/17/18 150 lb (68 kg)    Psych/Mental status: Alert, oriented x 3 (person, place, & time)       Eyes: PERLA Respiratory: No evidence of acute respiratory distress  Cervical Spine Area Exam  Skin & Axial Inspection: Well healed scar from previous spine surgery detected Alignment: Symmetrical Functional ROM: Restricted Cervical ROM     Pain with cervical extension Stability: No instability detected Muscle Tone/Strength: Functionally intact. No obvious  neuro-muscular anomalies detected. Sensory (Neurological): Unimpaired Palpation: No palpable anomalies             Positive Spurling's on the left Upper Extremity (UE) Exam    Side: Right upper extremity  Side: Left upper extremity  Skin & Extremity Inspection: Skin color, temperature, and hair growth are WNL. No peripheral edema or cyanosis. No masses, redness, swelling, asymmetry, or associated skin lesions. No contractures.  Skin & Extremity Inspection: Skin color, temperature, and hair growth are WNL. No peripheral edema or cyanosis. No masses, redness, swelling, asymmetry, or associated skin lesions. No contractures.  Functional ROM: Restricted cervical ROM          Functional ROM: Unrestricted ROM          Muscle Tone/Strength: Functionally intact. No obvious neuro-muscular anomalies detected.  Muscle Tone/Strength: Functionally intact. No obvious neuro-muscular anomalies detected.  Sensory (Neurological): Unimpaired          Sensory (Neurological): Unimpaired          Palpation: No palpable anomalies              Palpation: No palpable anomalies              Provocative Test(s):  Phalen's test:  deferred Tinel's test: deferred Apley's scratch test (touch opposite shoulder):  Action 1 (Across chest): Adequate ROM Action 2 (Overhead): Adequate ROM Action 3 (LB reach): Adequate ROM   Provocative Test(s):  Phalen's test: deferred Tinel's test: deferred Apley's scratch test (touch opposite shoulder):  Action 1 (Across chest): Adequate ROM Action 2 (Overhead): Adequate ROM Action 3 (LB reach): Adequate ROM   5 out of 5 strength bilateral upper extremity: Shoulder abduction, elbow flexion, elbow extension, thumb extension.  Thoracic Spine Area Exam  Skin & Axial Inspection: No masses, redness, or swelling Alignment: Symmetrical Functional ROM: Unrestricted ROM Stability: No instability detected Muscle Tone/Strength: Functionally intact. No obvious neuro-muscular anomalies detected. Sensory (Neurological): Unimpaired Muscle strength & Tone: No palpable anomalies  Lumbar Spine Area Exam  Skin & Axial Inspection: No masses, redness, or swelling Alignment: Symmetrical Functional ROM: Unrestricted ROM       Stability: No instability detected Muscle Tone/Strength: Functionally intact. No obvious neuro-muscular anomalies detected. Sensory (Neurological): Unimpaired  Ambulation: Unassisted Gait: Relatively normal for [name] and body habitus Posture: WNL   Lower Extremity Exam    Side: Right lower extremity  Side: Left lower extremity  Stability: No instability observed          Stability: No instability observed          Skin & Extremity Inspection: Skin color, temperature, and hair growth are WNL. No peripheral edema or cyanosis. No masses, redness, swelling, asymmetry, or associated skin lesions. No contractures.  Skin & Extremity Inspection: Skin color, temperature, and hair growth are WNL. No peripheral edema or cyanosis. No masses, redness, swelling, asymmetry, or associated skin lesions. No contractures.  Functional ROM: Unrestricted ROM                  Functional ROM:  Unrestricted ROM                  Muscle Tone/Strength: Functionally intact. No obvious neuro-muscular anomalies detected.  Muscle Tone/Strength: Functionally intact. No obvious neuro-muscular anomalies detected.  Sensory (Neurological): Unimpaired        Sensory (Neurological): Unimpaired                 Assessment  Primary Diagnosis & Pertinent Problem List: The primary encounter diagnosis  was Cervical radicular pain. Diagnoses of Cervical radiculopathy, Cervical fusion syndrome (C4-C7), Cocaine abuse ([place]), and MS (multiple sclerosis) ([place]) were also pertinent to this visit.  Visit Diagnosis (New problems to examiner): 1. Cervical radicular pain   2. Cervical radiculopathy   3. Cervical fusion syndrome (C4-C7)   4. Cocaine abuse ([place])   5. MS (multiple sclerosis) ([place])    Patient is a pleasant 49 year old male who presents with a chief complaint of neck pain which radiates into bilateral upper extremities in a dermatomal fashion.  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  He has tried physical therapy as well as medication trials in the past which were not very effective.  He does state that he obtained benefit with prednisone taper in the past.  He denies having tried cervical epidural steroid injection.  I reviewed the patient's cervical MRI with him in great detail.  We discussed cervical epidural steroid injection if his symptoms are better after steroid taper.  Risks and benefits of cervical ESI reviewed.  Patient like to proceed.  Plan: -Prednisone taper as below -Cervical ESI thereafter at C7-T1 if symptoms not improved. -Continue multiple sclerosis management with neurology. -Of note, patient is not a candidate for chronic opioid therapy given history of previous substance abuse including THC and cocaine abuse.  We will focus on interventional pain therapies  Plan of Care  (Initial workup plan)   Procedure Orders     Cervical Epidural Injection Pharmacotherapy (current): Medications ordered:  Meds ordered this encounter  Medications  . predniSONE (DELTASONE) 20 MG tablet    Sig: Take 3 tablets (60 mg total) by mouth daily with breakfast for 3 days, THEN 2 tablets (40 mg total) daily with breakfast for 3 days, THEN 1 tablet (20 mg total) daily with breakfast for 3 days.    Dispense:  18 tablet    Refill:  0    Medications administered during this visit: Akira L. Macnair had no medications administered during this visit.    Interventional management options: Mr. [name] was informed that there is no guarantee that he would be a candidate for interventional therapies. The decision will be based on the results of diagnostic studies, as well as Joshua [name]'s risk profile.  Procedure(s) under consideration:  Cervical epidural steroid injection Cervical/trapezius trigger point injections   Provider-requested follow-up: Return if symptoms worsen or fail to improve.  Future Appointments  Date Time Provider [place]  07/05/2019  8:50 AM Joshua Partridge, DO [facility] None    Note by: Joshua Santa, MD Date: 03/14/2019; Time: 11:33 AM

## 2019-03-13 NOTE — Telephone Encounter (Signed)
Attempted to call patient to complete New Patient form for tomorrow's appt. Message left.

## 2019-03-14 ENCOUNTER — Encounter: Payer: Self-pay | Admitting: Student in an Organized Health Care Education/Training Program

## 2019-03-14 ENCOUNTER — Other Ambulatory Visit: Payer: Self-pay

## 2019-03-14 ENCOUNTER — Ambulatory Visit
Payer: 59 | Attending: Student in an Organized Health Care Education/Training Program | Admitting: Student in an Organized Health Care Education/Training Program

## 2019-03-14 VITALS — BP 131/99 | HR 77 | Temp 97.1°F | Ht 67.0 in | Wt 145.0 lb

## 2019-03-14 DIAGNOSIS — G35 Multiple sclerosis: Secondary | ICD-10-CM | POA: Diagnosis present

## 2019-03-14 DIAGNOSIS — F1410 Cocaine abuse, uncomplicated: Secondary | ICD-10-CM | POA: Diagnosis present

## 2019-03-14 DIAGNOSIS — M5412 Radiculopathy, cervical region: Secondary | ICD-10-CM | POA: Insufficient documentation

## 2019-03-14 DIAGNOSIS — Q761 Klippel-Feil syndrome: Secondary | ICD-10-CM | POA: Insufficient documentation

## 2019-03-14 MED ORDER — PREDNISONE 20 MG PO TABS: ORAL_TABLET | ORAL | 0 refills | Status: AC

## 2019-03-15 ENCOUNTER — Ambulatory Visit: Payer: 59 | Admitting: Student in an Organized Health Care Education/Training Program

## 2019-04-12 ENCOUNTER — Other Ambulatory Visit: Payer: Self-pay

## 2019-04-12 ENCOUNTER — Emergency Department
Admission: EM | Admit: 2019-04-12 | Discharge: 2019-04-12 | Disposition: A | Discharge status: Home / Self Care | Payer: 59 | Attending: Emergency Medicine | Admitting: Emergency Medicine

## 2019-04-12 DIAGNOSIS — G35 Multiple sclerosis: Secondary | ICD-10-CM | POA: Insufficient documentation

## 2019-04-12 DIAGNOSIS — Z79899 Other long term (current) drug therapy: Secondary | ICD-10-CM | POA: Insufficient documentation

## 2019-04-12 DIAGNOSIS — F17200 Nicotine dependence, unspecified, uncomplicated: Secondary | ICD-10-CM | POA: Insufficient documentation

## 2019-04-12 DIAGNOSIS — M542 Cervicalgia: Secondary | ICD-10-CM

## 2019-04-12 LAB — CBC
HCT: 38.8 % — ABNORMAL LOW (ref 39.0–52.0)
Hemoglobin: 13.3 g/dL (ref 13.0–17.0)
MCH: 30 pg (ref 26.0–34.0)
MCHC: 34.3 g/dL (ref 30.0–36.0)
MCV: 87.4 fL (ref 80.0–100.0)
Platelets: 210 10*3/uL (ref 150–400)
RBC: 4.44 MIL/uL (ref 4.22–5.81)
RDW: 12.3 % (ref 11.5–15.5)
WBC: 6.3 10*3/uL (ref 4.0–10.5)
nRBC: 0 % (ref 0.0–0.2)

## 2019-04-12 LAB — BASIC METABOLIC PANEL
Anion gap: 6 (ref 5–15)
BUN: <5 mg/dL — ABNORMAL LOW (ref 6–20)
CO2: 28 mmol/L (ref 22–32)
Calcium: 8.4 mg/dL — ABNORMAL LOW (ref 8.9–10.3)
Chloride: 102 mmol/L (ref 98–111)
Creatinine, Ser: 0.85 mg/dL (ref 0.61–1.24)
GFR calc Af Amer: >60 mL/min (ref >60)
GFR calc non Af Amer: >60 mL/min (ref >60)
Glucose, Bld: 88 mg/dL (ref 70–99)
Potassium: 3.4 mmol/L — ABNORMAL LOW (ref 3.5–5.1)
Sodium: 136 mmol/L (ref 135–145)

## 2019-04-12 MED ORDER — ORPHENADRINE CITRATE 30 MG/ML IJ SOLN
60.0000 mg | Freq: Once | INTRAMUSCULAR | Status: AC
  Administered 2019-04-12: 60 mg via INTRAVENOUS
  Filled 2019-04-12: qty 2

## 2019-04-12 MED ORDER — TRAMADOL HCL 50 MG PO TABS
50.0000 mg | ORAL_TABLET | Freq: Once | ORAL | Status: AC
  Administered 2019-04-12: 50 mg via ORAL
  Filled 2019-04-12: qty 1

## 2019-04-12 MED ORDER — BACLOFEN 5 MG PO TABS: 5.0000 mg | ORAL_TABLET | Freq: Three times a day (TID) | ORAL | 0 refills | Status: DC | PRN

## 2019-04-12 MED ORDER — LIDOCAINE 5 % EX PTCH
1.0000 | MEDICATED_PATCH | CUTANEOUS | Status: DC
  Administered 2019-04-12: 1 via TRANSDERMAL
  Filled 2019-04-12: qty 1

## 2019-04-12 MED ORDER — LIDOCAINE 5 % EX PTCH: 1.0000 | MEDICATED_PATCH | CUTANEOUS | 0 refills | Status: DC

## 2019-04-12 MED ORDER — MELOXICAM 7.5 MG PO TABS: 7.5000 mg | ORAL_TABLET | Freq: Every day | ORAL | 0 refills | Status: AC

## 2019-04-12 MED ORDER — POTASSIUM CHLORIDE CRYS ER 20 MEQ PO TBCR
20.0000 meq | EXTENDED_RELEASE_TABLET | Freq: Once | ORAL | Status: AC
  Administered 2019-04-12: 20 meq via ORAL
  Filled 2019-04-12: qty 1

## 2019-04-12 MED ORDER — METHYLPREDNISOLONE SODIUM SUCC 125 MG IJ SOLR
125.0000 mg | Freq: Once | INTRAMUSCULAR | Status: AC
  Administered 2019-04-12: 125 mg via INTRAVENOUS
  Filled 2019-04-12: qty 2

## 2019-04-12 NOTE — ED Notes (Signed)
See triage note  Presents with neck pain.  States he has MS  Developed neck pain couple of days ago but pain as increased   Denies any injury   Thinks it may been a flare of his MS

## 2019-04-12 NOTE — ED Triage Notes (Signed)
Pt comes via POV from home with c/o neck pain. Pt states he has MS and unsure if this is from that or something else.  Pt states he is just tired of being in pain. Pt denies any recent injury.

## 2019-04-12 NOTE — ED Provider Notes (Signed)
[facility] Emergency Department Provider Note  ____________________________________________  Time seen: Approximately 3:07 PM  I have reviewed the triage vital signs and the nursing notes.   HISTORY  Chief Complaint Neck Pain    HPI Joshua [name] is a 49 y.o. male that presents to the emergency department for evaluation of acute on chronic neck pain this week.  Patient states that he has had neck pain for years after being diagnosed with MS. He is status post a 2014 cervical fusion at C4-T1.  Neck pain chronically radiates into both upper extremities.  Pain is sharp and tingles.  This is chronic and has not changed recently.  He saw Dr. Cherylann Ratel for his neck pain 1 month ago.  He had an MRI of his brain and neck in January 2021.  Patient states that he is supposed to go to [facility] tomorrow for the weekend and he does not want to be in pain for his vacation.  Patient states that he is tired of taking pain medication for his neck pain and wants a "fix." He would like a referral for a another neurosurgeon.  He did not call his neurosurgeon today because they are "booked for months." Last time he was evaluated for this in the ER, he had an IV, which helped and would like another.  No fevers, headaches, dizziness, shortness of breath, chest pain.  Past Medical History:  Diagnosis Date  . Allergy   . Anxiety   . Depression   . Multiple sclerosis ([facility])   . Neck pain   . Neuromuscular disorder [facility])     Patient Active Problem List   Diagnosis Date Noted  . Cervical radiculopathy 03/14/2019  . 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 Cocaine abuse ([facility]) 03/14/2019  . MS (multiple sclerosis) ([facility]) 06/07/2013    Past Surgical History:  Procedure Laterality Date  . NECK SURGERY  20014  . SPINAL FUSION      Prior to Admission medications   Medication Sig Start Date End Date Taking? Authorizing Provider  Baclofen 5 MG TABS Take 5 mg by mouth 3  (three) times daily as needed. 04/12/19   Enid Derry, PA-C  lidocaine (LIDODERM) 5 % Place 1 patch onto the skin daily. Remove & Discard patch within 12 hours or as directed by MD 04/12/19   Enid Derry, PA-C  meloxicam (MOBIC) 7.5 MG tablet Take 1 tablet (7.5 mg total) by mouth daily for 7 days. 04/12/19 04/19/19  Enid Derry, PA-C  Multiple Vitamins-Minerals (MULTIVITAMIN WITH MINERALS) tablet Take 1 tablet by mouth daily.    [provider]    Allergies Penicillins  Family History  Problem Relation Age of Onset  . Hypertension Mother   . Hypertension Father   . Multiple sclerosis Sister   . Aneurysm Brother     Social History Social History   Tobacco Use  . Smoking status: Current Every Day Smoker  . Smokeless tobacco: Never Used  Substance Use Topics  . Alcohol use: No  . Drug use: Not on file     Review of Systems  Constitutional: No fever/chills Cardiovascular: No chest pain. Respiratory: No SOB. Gastrointestinal: No abdominal pain.  No nausea, no vomiting.  Musculoskeletal: Positive for neck pain. Skin: Negative for rash, abrasions, lacerations, ecchymosis. Neurological: Negative for headaches. No new numbness or tingling.   ____________________________________________   PHYSICAL EXAM:  VITAL SIGNS: ED Triage Vitals [04/12/19 1409]  Enc Vitals Group     BP 128/74  Pulse Rate 79     Resp 18     Temp 98 F (36.7 C)     Temp src      SpO2 100 %     Weight 150 lb (68 kg)     Height 5\' 7"  (1.702 m)     Head Circumference      Peak Flow      Pain Score 10     Pain Loc      Pain Edu?      Excl. in GC?      Constitutional: Alert and oriented. Well appearing and in no acute distress. Eyes: Conjunctivae are normal. PERRL. EOMI. Head: Atraumatic. ENT:      Ears:      Nose: No congestion/rhinnorhea.      Mouth/Throat: Mucous membranes are moist.  Neck: No stridor. No cervical spine tenderness to palpation.  Mild tenderness palpation  to right cervical paraspinal muscles.  Full range of motion of neck. Cardiovascular: Normal rate, regular rhythm.  Good peripheral circulation. Respiratory: Normal respiratory effort without tachypnea or retractions. Lungs CTAB. Good air entry to the bases with no decreased or absent breath sounds. Musculoskeletal: Full range of motion to all extremities. No gross deformities appreciated. Neurologic:  Normal speech and language. No gross focal neurologic deficits are appreciated.  Skin:  Skin is warm, dry and intact. No rash noted. Psychiatric: Mood and affect are normal. Speech and behavior are normal. Patient exhibits appropriate insight and judgement.   ____________________________________________   LABS (all labs ordered are listed, but only abnormal results are displayed)  Labs Reviewed  CBC - Abnormal; Notable for the following components:      Result Value   HCT 38.8 (*)    All other components within normal limits  BASIC METABOLIC PANEL - Abnormal; Notable for the following components:   Potassium 3.4 (*)    BUN <5 (*)    Calcium 8.4 (*)    All other components within normal limits   ____________________________________________  EKG   ____________________________________________  RADIOLOGY  No results found.  ____________________________________________    PROCEDURES  Procedure(s) performed:    Procedures    Medications  lidocaine (LIDODERM) 5 % 1 patch (1 patch Transdermal Patch Applied 04/12/19 1556)  orphenadrine (NORFLEX) injection 60 mg (60 mg Intravenous Given 04/12/19 1556)  methylPREDNISolone sodium succinate (SOLU-MEDROL) 125 mg/2 mL injection 125 mg (125 mg Intravenous Given 04/12/19 1638)  potassium chloride SA (KLOR-CON) CR tablet 20 mEq (20 mEq Oral Given 04/12/19 1637)  traMADol (ULTRAM) tablet 50 mg (50 mg Oral Given 04/12/19 1738)     ____________________________________________   INITIAL IMPRESSION / ASSESSMENT AND PLAN / ED  COURSE  Pertinent labs & imaging results that were available during my care of the patient were reviewed by me and considered in my medical decision making (see chart for details).  Review of the [place] CSRS was performed in accordance of the [facility] prior to dispensing any controlled drugs.   Patient presented to the emergency department for evaluation of chronic neck pain.  Vital signs and exam are reassuring.  Patient is requesting pain control prior to his vacation at [facility] this weekend.  Patient states that his neck pain has been going on for years.  He has been evaluated for his neck pain by neurosurgery, last visit 1 month ago.  Patient was given Norflex, tramadol, Solu-Medrol for pain with improvement of symptoms.  Patient will be discharged home with prescriptions for Mobic, baclofen, Lidoderm. Patient is  to follow up with primary care and neurosurgery as directed. Patient is given ED precautions to return to the ED for any worsening or new symptoms.   DIETRICH SAMUELSON was evaluated in Emergency Department on 04/12/2019 for the symptoms described in the history of present illness. He was evaluated in the context of the global COVID-19 pandemic, which necessitated consideration that the patient might be at risk for infection with the SARS-CoV-2 virus that causes COVID-19. Institutional protocols and algorithms that pertain to the evaluation of patients at risk for COVID-19 are in a state of rapid change based on information released by regulatory bodies including the CDC and federal and state organizations. These policies and algorithms were followed during the patient's care in the ED.  ____________________________________________  FINAL CLINICAL IMPRESSION(S) / ED DIAGNOSES  Final diagnoses:  Neck pain      NEW MEDICATIONS STARTED DURING THIS VISIT:  ED Discharge Orders         Ordered    meloxicam (MOBIC) 7.5 MG tablet  Daily     04/12/19 1727    Baclofen 5 MG TABS  3 times  daily PRN     04/12/19 1727    lidocaine (LIDODERM) 5 %  Every 24 hours     04/12/19 1727              This chart was dictated using voice recognition software/Dragon. Despite best efforts to proofread, errors can occur which can change the meaning. Any change was purely unintentional.    Laban Emperor, PA-C 04/12/19 4854    Delman Kitten, MD 04/13/19 [phone]

## 2019-04-12 NOTE — ED Triage Notes (Signed)
Patient presents to the ED with severe neck pain.  Patient has history of MS and states he is also having generalized body aches.  Patient states, "I don't know if this is the MS or something different but it is kicking my butt today."  Patient is in no obvious distress at this time.  Ambulatory to triage with steady gait.

## 2019-04-19 ENCOUNTER — Telehealth: Payer: Self-pay

## 2019-04-19 ENCOUNTER — Telehealth: Payer: Self-pay | Admitting: Neurology

## 2019-04-19 NOTE — Telephone Encounter (Signed)
Yes, I would like him to start Ocrevus, can we check up on that.  Also I had prescribed him Lyrica, which he should be taking

## 2019-04-19 NOTE — Telephone Encounter (Signed)
Wife Marylene Land notified as well. [phone].

## 2019-04-19 NOTE — Progress Notes (Unsigned)
Started initial referral for Ocrevus first dose. Sent to [facility]. Pending authorization.

## 2019-04-19 NOTE — Telephone Encounter (Signed)
Please see call note

## 2019-04-19 NOTE — Telephone Encounter (Signed)
Franco Nones, NP had called in on patient- he was at her office at the moment. She said he hadn't heard anything about the Ocrevus infusions that were discussed during last visit in December and didn't know anything about supposed to be taking the Lyrica medication. She was wanting to clarify this info. If you would please call and give her an update after 1:30 when they return from lunch. And call the patient if supposed to be on the lyrica medication. Thanks!

## 2019-04-19 NOTE — Telephone Encounter (Signed)
Started process for Ocrevus and contacted wife. Patient is to start Lyrica too.

## 2019-04-19 NOTE — Telephone Encounter (Signed)
Left message of the above to [facility]

## 2019-04-26 ENCOUNTER — Other Ambulatory Visit: Payer: Self-pay

## 2019-04-26 NOTE — Progress Notes (Signed)
Ocrevus infusion to start Monday March the 29,2021. [facility]. See scan form. Approved per Jeannette [facility]

## 2019-07-03 ENCOUNTER — Other Ambulatory Visit: Payer: Self-pay | Admitting: Neurology

## 2019-07-03 NOTE — Progress Notes (Deleted)
NEUROLOGY FOLLOW UP OFFICE NOTE  Joshua [name] [id number]  HISTORY OF PRESENT ILLNESS: Joshua [name] is a 49 year old left-handed African American male who follows up for multiple sclerosis.  UPDATE: Current DMT:  Ocrevus (***) Other medications:  Modafinil 100mg ; ***  01/03/2019 LABS:  CBC with WBC 6.7, HGB 15, HCT 43.9, PLT 256, ALC 2.0; CMP with Na 140, K 5.1, Cl 105, CO2 28, Ca 9.4, glucose 89, BUN 8, Cr 0.84, t bili 0.3, ALP 53, AST 18, ALT 13; Hep B S ab nonreactive; Quantiferon-TB Gold negative; vitamin D 17.37  02/13/2019 MRI BRAIN W WO:  1. Mild patchy T2/FLAIR signal abnormality involving the periventricular and juxta cortical supratentorial cerebral white matter, consistent with multiple sclerosis. Overall, appearance is mildly progressed relative to 2013. No evidence for active demyelination.  2. Underlying mildly progressed cerebral atrophy for age.  3. Chronic right maxillary sinusitis. 02/13/2019 MRI CERVICAL SPINE W WO:  1. Patchy multifocal cord signal abnormality involving the cervical spinal cord as above, consistent with history of multiple sclerosis.  Overall, appearance is minimally progressed relative to 2015. No evidence for active demyelination.  2. Prior ACDF at C4 through C7 without residual stenosis.  3. 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  Vision:  No issues Motor:  No weakness Sensory:  Paresthesias involving all extremities. Pain:  Chronic diffuse pain.  Chronic neck pain.  Chronic back pain.  Chronic chest pain Gait:  Unsteady gait.   Bowel/Bladder:  No issues Fatigue:  Daytime fatigue.  Poor sleep Cognition:  Reports short term memory deficits.  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 Mood:  Depressed Erectile dysfunction  HISTORY: Initially presented with hand numbness and diffuse pain.  MRI of brain and cervical cord at the time  revealed numerous periventricular and cervical spinal cord lesions.  He did not follow up with neurology and continued to experience diffuse pain, arm and hand numbness, unsteady gait with falls and "MS hug".  Other pertinent history: 2005 Subdural hematoma: "spontaneous bleed" in setting of cocaine use and high blood pressure s/p surgery on the left skull region.  07/12/2013 ACDF C4-5 and C6-7.  Headaches resolved.  Still with neck pain. Right ulnar neuropathy on NCV-EMG 05/27/2014. History of alcoholism and drug addiction.  Family History:  Sister (diagnosed with MS in her 30s)  Current disease modifying therapy:  none Past disease modifying therapy:  Tecfidera (reports that it didn't work)  Current medications:  none Past medications:  Cymbalta 30mg  daily; gabapentin  Imaging: 11/13/2009 MRI BRAIN W WO:  Multiple periventricular, deep and subcortical white matter lesions, including characteristic perpendicular lesion adjacent to the right ventricle. 11/13/2009 MRI CERVICAL & THORACIC SPINE W WO:  T2/STIR hyperintense lesions in the cervical medullary junction, C5, T1-2, T7-8, and T9  vertebral levels without evidence of enhancement.  Multilevel degenerative changes of the cervical spine. 07/07/2010 MRI CERVICAL SPINE W WO (personally reviewed):  Nonenhancing lesions at C1 and C5 levels. 01/04/2012 MRI BRAIN W WO (personally reviewed):  Two tiny nonspecific nonenhancing hyperintense foci in subcortical white matter. 05/17/2013 MRI CERVICAL SPINE WO:  Multiple nonenhancing lesions within spinal cord, reportedly stable compared to imaging from 07/07/2010.  Spondylosis with right paracentral disc herniation without neural compression at C3-4, broad based disc protrusion with bilateral neural foraminal stenosis compressing C7 nerve roots at C6-7, and left-sided facet arthropathy with edema at C7-T1 09/11/2013 MRI CERVICAL SPINE WO:  S/p ACDF at C4-5 and C6-7, increased disc herniation and spinal  stenosis at C3-4, chronjic left facet arthropathy at C7-T1. 06/07/2014 MRI BRAIN W WO: Multiple T2 hyperintense lesions within periventricular and subcortical white matter without abnormal enhancement. 06/07/2014 MRI BRAIN W WO: Multiple T2 hyperintense peripheral cervical spinal cord lesions from the level of the C1 arch to the T2 level without abnormal enhancement.  Status post C4-C7 ACDF.  Small disc bulge at C3-C4 resulting in mild spinal canal stenosis.  Labs: 04/22/2014 positive JC Virus Ab with index 1.28 Reportedly underwent lumbar puncture for CSF analysis, revealing 10 oligoclonal bands.  PAST MEDICAL HISTORY: Past Medical History:  Diagnosis Date  . Allergy   . Anxiety   . Depression   . Multiple sclerosis ([place])   . Neck pain   . Neuromuscular disorder [facility])     MEDICATIONS: Current Outpatient Medications on File Prior to Visit  Medication Sig Dispense Refill  . Baclofen 5 MG TABS Take 5 mg by mouth 3 (three) times daily as needed. 15 tablet 0  . lidocaine (LIDODERM) 5 % Place 1 patch onto the skin daily. Remove & Discard patch within 12 hours or as directed by MD 30 patch 0  . Multiple Vitamins-Minerals (MULTIVITAMIN WITH MINERALS) tablet Take 1 tablet by mouth daily.     No current facility-administered medications on file prior to visit.    ALLERGIES: Allergies  Allergen Reactions  . Penicillins Hives    As a child     FAMILY HISTORY: Family History  Problem Relation Age of Onset  . Hypertension Mother   . Hypertension Father   . Multiple sclerosis Sister   . Aneurysm Brother     SOCIAL HISTORY: Social History   Socioeconomic History  . Marital status: Married    Spouse name: Not on file  . Number of children: 2  . Years of education: Not on file  . Highest education level: High school graduate  Occupational History  . Not on file  Tobacco Use  . Smoking status: Current Every Day Smoker  . Smokeless tobacco: Never Used  Substance and Sexual  Activity  . Alcohol use: No  . Drug use: Not on file  . Sexual activity: Not on file  Other Topics Concern  . Not on file  Social History Narrative   Pt is married he lives with his wife   He has 2 children   Left handed also can write with right handed   Drinks coffee every morning, tea sometime, soda not often    Social Determinants of Radio broadcast assistant Strain:   . Difficulty of Paying Living Expenses:   Food Insecurity:   . Worried About Charity fundraiser in the Last Year:   . Arboriculturist in the Last Year:   Transportation Needs:   . Film/video editor (Medical):   Marland Kitchen Lack of Transportation (Non-Medical):   Physical Activity:   . Days of Exercise per Week:   . Minutes of Exercise per Session:   Stress:   . Feeling of Stress :   Social Connections:   . Frequency of Communication with Friends and Family:   . Frequency of Social Gatherings with Friends and Family:   . Attends Religious Services:   . Active Member of Clubs or Organizations:   . Attends Archivist Meetings:   Marland Kitchen Marital Status:   Intimate Partner Violence:   . Fear of Current or Ex-Partner:   . Emotionally  Abused:   Marland Kitchen Physically Abused:   . Sexually Abused:     REVIEW OF SYSTEMS: Constitutional: No fevers, chills, or sweats, no generalized fatigue, change in appetite Eyes: No visual changes, double vision, eye pain Ear, nose and throat: No hearing loss, ear pain, nasal congestion, sore throat Cardiovascular: No chest pain, palpitations Respiratory:  No shortness of breath at rest or with exertion, wheezes GastrointestinaI: No nausea, vomiting, diarrhea, abdominal pain, fecal incontinence Genitourinary:  No dysuria, urinary retention or frequency Musculoskeletal:  No neck pain, back pain Integumentary: No rash, pruritus, skin lesions Neurological: as above Psychiatric: No depression, insomnia, anxiety Endocrine: No palpitations, fatigue, diaphoresis, mood swings, change in  appetite, change in weight, increased thirst Hematologic/Lymphatic:  No purpura, petechiae. Allergic/Immunologic: no itchy/runny eyes, nasal congestion, recent allergic reactions, rashes  PHYSICAL EXAM: *** General: No acute distress.  Patient appears ***-groomed.   Head:  Normocephalic/atraumatic Eyes:  Fundi examined but not visualized Neck: supple, no paraspinal tenderness, full range of motion Heart:  Regular rate and rhythm Lungs:  Clear to auscultation bilaterally Back: No paraspinal tenderness Neurological Exam: alert and oriented to person, place, and time. Attention span and concentration intact, recent and remote memory intact, fund of knowledge intact.  Speech fluent and not dysarthric, language intact.  CN II-XII intact. Bulk and tone normal, muscle strength 5/5 throughout.  Sensation to light touch, temperature and vibration intact.  Deep tendon reflexes 2+ throughout, toes downgoing.  Finger to nose and heel to shin testing intact.  Gait normal, Romberg negative.  IMPRESSION: ***  PLAN: ***  Shon Millet, DO  CC: ***

## 2019-07-05 ENCOUNTER — Ambulatory Visit: Payer: 59 | Admitting: Neurology

## 2019-07-18 ENCOUNTER — Other Ambulatory Visit: Payer: Self-pay

## 2019-07-18 ENCOUNTER — Encounter: Payer: Self-pay | Admitting: Student in an Organized Health Care Education/Training Program

## 2019-07-18 ENCOUNTER — Ambulatory Visit
Admission: RE | Admit: 2019-07-18 | Discharge: 2019-07-18 | Disposition: A | Discharge status: Home / Self Care | Payer: 59 | Source: Ambulatory Visit | Attending: Student in an Organized Health Care Education/Training Program | Admitting: Student in an Organized Health Care Education/Training Program

## 2019-07-18 ENCOUNTER — Ambulatory Visit (HOSPITAL_BASED_OUTPATIENT_CLINIC_OR_DEPARTMENT_OTHER): Payer: 59 | Admitting: Student in an Organized Health Care Education/Training Program

## 2019-07-18 DIAGNOSIS — Z79899 Other long term (current) drug therapy: Secondary | ICD-10-CM | POA: Insufficient documentation

## 2019-07-18 DIAGNOSIS — M5412 Radiculopathy, cervical region: Secondary | ICD-10-CM | POA: Insufficient documentation

## 2019-07-18 DIAGNOSIS — Z981 Arthrodesis status: Secondary | ICD-10-CM | POA: Insufficient documentation

## 2019-07-18 DIAGNOSIS — Z880 Allergy status to penicillin: Secondary | ICD-10-CM | POA: Insufficient documentation

## 2019-07-18 DIAGNOSIS — M542 Cervicalgia: Secondary | ICD-10-CM | POA: Diagnosis present

## 2019-07-18 MED ORDER — SODIUM CHLORIDE 0.9% FLUSH
1.0000 mL | Freq: Once | INTRAVENOUS | Status: AC
  Administered 2019-07-18: 1 mL

## 2019-07-18 MED ORDER — IOHEXOL 180 MG/ML  SOLN
10.0000 mL | Freq: Once | INTRAMUSCULAR | Status: AC
  Administered 2019-07-18: 10 mL via EPIDURAL
  Filled 2019-07-18: qty 20

## 2019-07-18 MED ORDER — LIDOCAINE HCL 2 % IJ SOLN
20.0000 mL | Freq: Once | INTRAMUSCULAR | Status: AC
  Administered 2019-07-18: 400 mg

## 2019-07-18 MED ORDER — CYCLOBENZAPRINE HCL 10 MG PO TABS: 10.0000 mg | ORAL_TABLET | Freq: Three times a day (TID) | ORAL | 0 refills | Status: DC | PRN

## 2019-07-18 MED ORDER — DEXAMETHASONE SODIUM PHOSPHATE 10 MG/ML IJ SOLN
10.0000 mg | Freq: Once | INTRAMUSCULAR | Status: AC
  Administered 2019-07-18: 10 mg
  Filled 2019-07-18: qty 1

## 2019-07-18 MED ORDER — ROPIVACAINE HCL 2 MG/ML IJ SOLN
INTRAMUSCULAR | Status: AC
  Filled 2019-07-18: qty 10

## 2019-07-18 MED ORDER — ROPIVACAINE HCL 2 MG/ML IJ SOLN
1.0000 mL | Freq: Once | INTRAMUSCULAR | Status: AC
  Administered 2019-07-18: 1 mL via EPIDURAL

## 2019-07-18 MED ORDER — LIDOCAINE HCL 2 % IJ SOLN
INTRAMUSCULAR | Status: AC
  Filled 2019-07-18: qty 20

## 2019-07-18 MED ORDER — SODIUM CHLORIDE (PF) 0.9 % IJ SOLN
INTRAMUSCULAR | Status: AC
  Filled 2019-07-18: qty 10

## 2019-07-18 MED ORDER — FENTANYL CITRATE (PF) 100 MCG/2ML IJ SOLN
INTRAMUSCULAR | Status: AC
  Filled 2019-07-18: qty 2

## 2019-07-18 MED ORDER — FENTANYL CITRATE (PF) 100 MCG/2ML IJ SOLN
25.0000 ug | INTRAMUSCULAR | Status: DC | PRN
  Administered 2019-07-18: 50 ug via INTRAVENOUS

## 2019-07-18 MED ORDER — MIDAZOLAM HCL 5 MG/5ML IJ SOLN
INTRAMUSCULAR | Status: AC
  Filled 2019-07-18: qty 5

## 2019-07-18 NOTE — Progress Notes (Signed)
PROVIDER NOTE: Information contained herein reflects review and annotations entered in association with encounter. Interpretation of such information and data should be left to medically-trained personnel. Information provided to patient can be located elsewhere in the medical record under "Patient Instructions". Document created using STT-dictation technology, any transcriptional errors that may result from process are unintentional.    Patient: Joshua [name]  Service Category: Procedure  Provider: Edward Jolly, MD  DOB: 1970-08-13  DOS: 07/18/2019  Location: [facility]  MRN: [id number]  Setting: Ambulatory - outpatient  Referring Provider: Edward Jolly, MD  Type: Established Patient  Specialty: Interventional Pain Management  PCP: Armando Gang, FNP   Primary Reason for Visit: Interventional Pain Management Treatment. CC: Neck Pain (midline more on the right side. )  Procedure:          Anesthesia, Analgesia, Anxiolysis:  Type: Diagnostic, Inter-Laminar, Cervical Epidural Steroid Injection  #1  Region: Posterior Cervico-thoracic Region Level: C6-C7 Laterality: Midline Paramedial  Type: Moderate (Conscious) Sedation combined with Local Anesthesia Indication(s): Analgesia and Anxiety Route: Intravenous (IV) IV Access: Secured Sedation: Meaningful verbal contact was maintained at all times during the procedure  Local Anesthetic: Lidocaine 1-2%  Position: Prone with head of the table was raised to facilitate breathing.   Indications: 1. Cervical radicular pain   2. Cervical radiculopathy    Pain Score: Pre-procedure: 8 /10 Post-procedure: 0-No pain/10   Pre-op Assessment:  Mr. Zeek is a 49 y.o. (year old), male patient, seen today for interventional treatment. He  has a past surgical history that includes Spinal fusion and Neck surgery (20014). Mr. Beckstrand has a current medication list which includes the following prescription(s): lidocaine, modafinil,  multivitamin with minerals, cyclobenzaprine, and pregabalin, and the following Facility-Administered Medications: fentanyl. His primarily concern today is the Neck Pain (midline more on the right side. )  Initial Vital Signs:  Pulse/HCG Rate: 70ECG Heart Rate: 61 Temp: 97.9 F (36.6 C) Resp: 16 BP: 96/72 SpO2: 100 %  BMI: Estimated body mass index is 22.71 kg/m as calculated from the following:   Height as of this encounter: 5\' 7"  (1.702 m).   Weight as of this encounter: 145 lb (65.8 kg).  Risk Assessment: Allergies: Reviewed. He is allergic to penicillins.  Allergy Precautions: None required Coagulopathies: Reviewed. None identified.  Blood-thinner therapy: None at this time Active Infection(s): Reviewed. None identified. Mr. Goddard is afebrile  Site Confirmation: Mr. Kentner was asked to confirm the procedure and laterality before marking the site Procedure checklist: Completed Consent: Before the procedure and under the influence of no sedative(s), amnesic(s), or anxiolytics, the patient was informed of the treatment options, risks and possible complications. To fulfill our ethical and legal obligations, as recommended by the American Medical Association's Code of Ethics, I have informed the patient of my clinical impression; the nature and purpose of the treatment or procedure; the risks, benefits, and possible complications of the intervention; the alternatives, including doing nothing; the risk(s) and benefit(s) of the alternative treatment(s) or procedure(s); and the risk(s) and benefit(s) of doing nothing. The patient was provided information about the general risks and possible complications associated with the procedure. These may include, but are not limited to: failure to achieve desired goals, infection, bleeding, organ or nerve damage, allergic reactions, paralysis, and death. In addition, the patient was informed of those risks and complications associated to  Spine-related procedures, such as failure to decrease pain; infection (i.e.: Meningitis, epidural or intraspinal abscess); bleeding (i.e.: epidural hematoma, subarachnoid hemorrhage, or any  other type of intraspinal or peri-dural bleeding); organ or nerve damage (i.e.: Any type of peripheral nerve, nerve root, or spinal cord injury) with subsequent damage to sensory, motor, and/or autonomic systems, resulting in permanent pain, numbness, and/or weakness of one or several areas of the body; allergic reactions; (i.e.: anaphylactic reaction); and/or death. Furthermore, the patient was informed of those risks and complications associated with the medications. These include, but are not limited to: allergic reactions (i.e.: anaphylactic or anaphylactoid reaction(s)); adrenal axis suppression; blood sugar elevation that in diabetics may result in ketoacidosis or comma; water retention that in patients with history of congestive heart failure may result in shortness of breath, pulmonary edema, and decompensation with resultant heart failure; weight gain; swelling or edema; medication-induced neural toxicity; particulate matter embolism and blood vessel occlusion with resultant organ, and/or nervous system infarction; and/or aseptic necrosis of one or more joints. Finally, the patient was informed that Medicine is not an exact science; therefore, there is also the possibility of unforeseen or unpredictable risks and/or possible complications that may result in a catastrophic outcome. The patient indicated having understood very clearly. We have given the patient no guarantees and we have made no promises. Enough time was given to the patient to ask questions, all of which were answered to the patient's satisfaction. Mr. Schiavo has indicated that he wanted to continue with the procedure. Attestation: I, the ordering provider, attest that I have discussed with the patient the benefits, risks, side-effects, alternatives,  likelihood of achieving goals, and potential problems during recovery for the procedure that I have provided informed consent. Date  Time: 07/18/2019 10:17 AM  Pre-Procedure Preparation:  Monitoring: As per clinic protocol. Respiration, ETCO2, SpO2, BP, heart rate and rhythm monitor placed and checked for adequate function Safety Precautions: Patient was assessed for positional comfort and pressure points before starting the procedure. Time-out: I initiated and conducted the "Time-out" before starting the procedure, as per protocol. The patient was asked to participate by confirming the accuracy of the "Time Out" information. Verification of the correct person, site, and procedure were performed and confirmed by me, the nursing staff, and the patient. "Time-out" conducted as per Joint Commission's Universal Protocol (UP.01.01.01). Time: 1058  Description of Procedure:          Target Area: For Epidural Steroid injections the target is the interlaminar space, initially targeting the lower border of the superior vertebral body lamina. Approach: Paramedial approach. Area Prepped: Entire PosteriorCervical Region DuraPrep (Iodine Povacrylex [0.7% available iodine] and Isopropyl Alcohol, 74% w/w) Safety Precautions: Aspiration looking for blood return was conducted prior to all injections. At no point did we inject any substances, as a needle was being advanced. No attempts were made at seeking any paresthesias. Safe injection practices and needle disposal techniques used. Medications properly checked for expiration dates. SDV (single dose vial) medications used. Description of the Procedure: Protocol guidelines were followed. The procedure needle was introduced through the skin, ipsilateral to the reported pain, and advanced to the target area. Bone was contacted and the needle walked caudad, until the lamina was cleared. The epidural space was identified using "loss-of-resistance technique" with 2-3 ml of  PF-NaCl (0.9% NSS), in a 5cc LOR glass syringe. Vitals:   07/18/19 1107 07/18/19 1117 07/18/19 1127 07/18/19 1136  BP: 102/76 99/67 98/68  100/69  Pulse:      Resp: 15 11 13 14   Temp:  (!) 97.5 F (36.4 C)  (!) 97.2 F (36.2 C)  TempSrc:  SpO2: 98% 100% 100% 100%  Weight:      Height:        Start Time: 1058 hrs. End Time: 1107 hrs. Materials:  Needle(s) Type: Epidural needle Gauge: 17G Length: 3.5-in Medication(s): Please see orders for medications and dosing details.  Imaging Guidance (Spinal):          Type of Imaging Technique: Fluoroscopy Guidance (Spinal) Indication(s): Assistance in needle guidance and placement for procedures requiring needle placement in or near specific anatomical locations not easily accessible without such assistance. Exposure Time: Please see nurses notes. Contrast: Before injecting any contrast, we confirmed that the patient did not have an allergy to iodine, shellfish, or radiological contrast. Once satisfactory needle placement was completed at the desired level, radiological contrast was injected. Contrast injected under live fluoroscopy. No contrast complications. See chart for type and volume of contrast used. Fluoroscopic Guidance: I was personally present during the use of fluoroscopy. "Tunnel Vision Technique" used to obtain the best possible view of the target area. Parallax error corrected before commencing the procedure. "Direction-depth-direction" technique used to introduce the needle under continuous pulsed fluoroscopy. Once target was reached, antero-posterior, oblique, and lateral fluoroscopic projection used confirm needle placement in all planes. Images permanently stored in EMR. Interpretation: I personally interpreted the imaging intraoperatively. Adequate needle placement confirmed in multiple planes. Appropriate spread of contrast into desired area was observed. No evidence of afferent or efferent intravascular uptake. No intrathecal  or subarachnoid spread observed. Permanent images saved into the patient's record.  Antibiotic Prophylaxis:   Anti-infectives (From admission, onward)   None     Indication(s): None identified  Post-operative Assessment:  Post-procedure Vital Signs:  Pulse/HCG Rate: 70(!) 59 Temp: (!) 97.2 F (36.2 C) Resp: 14 BP: 100/69 SpO2: 100 %  EBL: None  Complications: No immediate post-treatment complications observed by team, or reported by patient.  Note: The patient tolerated the entire procedure well. A repeat set of vitals were taken after the procedure and the patient was kept under observation following institutional policy, for this type of procedure. Post-procedural neurological assessment was performed, showing return to baseline, prior to discharge. The patient was provided with post-procedure discharge instructions, including a section on how to identify potential problems. Should any problems arise concerning this procedure, the patient was given instructions to immediately contact us, at any time, without hesitation. In any case, we plan to contact the patient by telephone for a follow-up status report regarding this interventional procedure.  Comments:  No additional relevant information.  Plan of Care  Orders:  Orders Placed This Encounter  Procedures  . DG PAIN CLINIC C-ARM 1-60 MIN NO REPORT    Intraoperative interpretation by procedural physician at [facility].    Standing Status:   Standing    Number of Occurrences:   1    Order Specific Question:   Reason for exam:    Answer:   Assistance in needle guidance and placement for procedures requiring needle placement in or near specific anatomical locations not easily accessible without such assistance.   Stop baclofen.  Start Flexeril as below.  Medications ordered for procedure: Meds ordered this encounter  Medications  . iohexol (OMNIPAQUE) 180 MG/ML injection 10 mL    Must be Myelogram-compatible. If  not available, you may substitute with a water-soluble, non-ionic, hypoallergenic, myelogram-compatible radiological contrast medium.  Marland Kitchen lidocaine (XYLOCAINE) 2 % (with pres) injection 400 mg  . fentaNYL (SUBLIMAZE) injection 25-50 mcg    Make sure Narcan is available in  the pyxis when using this medication. In the event of respiratory depression (RR< 8/min): Titrate NARCAN (naloxone) in increments of 0.1 to 0.2 mg IV at 2-3 minute intervals, until desired degree of reversal.  . ropivacaine (PF) 2 mg/mL (0.2%) (NAROPIN) injection 1 mL  . sodium chloride flush (NS) 0.9 % injection 1 mL  . dexamethasone (DECADRON) injection 10 mg  . cyclobenzaprine (FLEXERIL) 10 MG tablet    Sig: Take 1 tablet (10 mg total) by mouth 3 (three) times daily as needed for muscle spasms.    Dispense:  90 tablet    Refill:  0    Do not place this medication, or any other prescription from our practice, on "Automatic Refill". Patient may have prescription filled one day early if pharmacy is closed on scheduled refill date.   Medications administered: We administered iohexol, lidocaine, fentaNYL, ropivacaine (PF) 2 mg/mL (0.2%), sodium chloride flush, and dexamethasone.  See the medical record for exact dosing, route, and time of administration.  Follow-up plan:   Return in about 5 weeks (around 08/22/2019) for Post Procedure Evaluation, virtual.         Recent Visits No visits were found meeting these conditions. Showing recent visits within past 90 days and meeting all other requirements Today's Visits Date Type Provider Dept  07/18/19 Procedure visit Gillis Santa, MD [facility]  Showing today's visits and meeting all other requirements Future Appointments Date Type Provider Dept  08/22/19 Appointment Gillis Santa, MD [facility]  Showing future appointments within next 90 days and meeting all other requirements  Disposition: Discharge home  Discharge (Date  Time): 07/18/2019; 1138  hrs.   Primary Care Physician: Remi Haggard, FNP Location: [facility] Note by: Gillis Santa, MD Date: 07/18/2019; Time: 1:26 PM  Disclaimer:  Medicine is not an exact science. The only guarantee in medicine is that nothing is guaranteed. It is important to note that the decision to proceed with this intervention was based on the information collected from the patient. The Data and conclusions were drawn from the patient's questionnaire, the interview, and the physical examination. Because the information was provided in large part by the patient, it cannot be guaranteed that it has not been purposely or unconsciously manipulated. Every effort has been made to obtain as much relevant data as possible for this evaluation. It is important to note that the conclusions that lead to this procedure are derived in large part from the available data. Always take into account that the treatment will also be dependent on availability of resources and existing treatment guidelines, considered by other Pain Management Practitioners as being common knowledge and practice, at the time of the intervention. For Medico-Legal purposes, it is also important to point out that variation in procedural techniques and pharmacological choices are the acceptable norm. The indications, contraindications, technique, and results of the above procedure should only be interpreted and judged by a Board-Certified Interventional Pain Specialist with extensive familiarity and expertise in the same exact procedure and technique.

## 2019-07-18 NOTE — Progress Notes (Signed)
Safety precautions to be maintained throughout the outpatient stay will include: orient to surroundings, keep bed in low position, maintain call bell within reach at all times, provide assistance with transfer out of bed and ambulation.  

## 2019-07-18 NOTE — Patient Instructions (Signed)

## 2019-08-01 ENCOUNTER — Telehealth: Payer: Self-pay | Admitting: Neurology

## 2019-08-01 ENCOUNTER — Telehealth: Payer: Self-pay

## 2019-08-01 MED ORDER — MODAFINIL 200 MG PO TABS: 100.0000 mg | ORAL_TABLET | Freq: Every day | ORAL | 4 refills | Status: DC

## 2019-08-01 NOTE — Telephone Encounter (Signed)
Sent in refill for Modafinil. Called pt who states he is having severe neck pain and asking what he can do about it. Suggested he needs to call his pain management provider to address this and help him with FMLA/disability if that is his primary problem making work difficult. He also states his MS is causing a lesser degree of difficulty but problematic with incr jerking of his body. I informed him no appointments available sooner that his 12/2019 scheduled one but told him I would let Dr Everlena Cooper know of his concerns and would let him know of any further recommendations. He verbalized understanding.

## 2019-08-01 NOTE — Telephone Encounter (Signed)
Patient called in needing a refill of his modafinil to [facility]. He also is having severe neck pain. He would like to get some advice on what he can do until his appointment?

## 2019-08-01 NOTE — Telephone Encounter (Signed)
I forgot that modafinil is a scheduled med so need you to address this as well as the phone note I sent to you. Thanks

## 2019-08-02 ENCOUNTER — Other Ambulatory Visit: Payer: Self-pay | Admitting: Neurology

## 2019-08-02 MED ORDER — MODAFINIL 100 MG PO TABS: 100.0000 mg | ORAL_TABLET | Freq: Every day | ORAL | 2 refills | Status: DC

## 2019-08-02 NOTE — Telephone Encounter (Signed)
Modafinil 100mg  daily refilled

## 2019-08-20 ENCOUNTER — Emergency Department
Admission: EM | Admit: 2019-08-20 | Discharge: 2019-08-20 | Disposition: A | Discharge status: Home / Self Care | Payer: 59 | Attending: Student in an Organized Health Care Education/Training Program | Admitting: Student in an Organized Health Care Education/Training Program

## 2019-08-20 ENCOUNTER — Other Ambulatory Visit: Payer: Self-pay

## 2019-08-20 DIAGNOSIS — M542 Cervicalgia: Secondary | ICD-10-CM | POA: Insufficient documentation

## 2019-08-20 DIAGNOSIS — M7918 Myalgia, other site: Secondary | ICD-10-CM | POA: Insufficient documentation

## 2019-08-20 DIAGNOSIS — G35 Multiple sclerosis: Secondary | ICD-10-CM

## 2019-08-20 DIAGNOSIS — F17200 Nicotine dependence, unspecified, uncomplicated: Secondary | ICD-10-CM | POA: Insufficient documentation

## 2019-08-20 DIAGNOSIS — G8929 Other chronic pain: Secondary | ICD-10-CM | POA: Insufficient documentation

## 2019-08-20 MED ORDER — PREDNISONE 20 MG PO TABS
60.0000 mg | ORAL_TABLET | Freq: Once | ORAL | Status: DC
  Filled 2019-08-20: qty 3

## 2019-08-20 MED ORDER — SODIUM CHLORIDE 0.9 % IV SOLN
1000.0000 mg | Freq: Once | INTRAVENOUS | Status: AC
  Administered 2019-08-20: 1000 mg via INTRAVENOUS
  Filled 2019-08-20: qty 8

## 2019-08-20 NOTE — ED Triage Notes (Signed)
Pt comes with c/o severe neck pain and MS episode Pt states that in the past Presidone has been prescribed for him and it helps with the pain and his MD episodes.

## 2019-08-20 NOTE — ED Provider Notes (Signed)
[facility] Emergency Department Provider Note ____________________________________________  Time seen: 1329  I have reviewed the triage vital signs and the nursing notes.  HISTORY  Chief Complaint  Neck Pain  HPI Joshua [name] is a 49 y.o. male with a history of MS, and chronic neck pain, presents to the ED for evaluation of pain.  Patient is 7 years after a spinal fusion surgery to the cervical spine.  He also had a recent diagnosis of MS, and being followed by neurology and his primary provider.  He denies any current MS infusion therapies, and upon further discussion, is poorly compliant with his prescriptions as given.  Patient presents today with request for steroids, which he states helps his MS flares.  He denies any visual disturbance, headache, fever, chills, or sweats.   Past Medical History:  Diagnosis Date  . Allergy   . Anxiety   . Depression   . Multiple sclerosis (HCC)   . Neck pain   . Neuromuscular disorder [facility])     Patient Active Problem List   Diagnosis Date Noted  . Cervical radiculopathy 03/14/2019  . 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 Cocaine abuse (HCC) 03/14/2019  . MS (multiple sclerosis) (HCC) 06/07/2013    Past Surgical History:  Procedure Laterality Date  . NECK SURGERY  2014  . SPINAL FUSION      Prior to Admission medications   Medication Sig Start Date End Date Taking? Authorizing Provider  modafinil (PROVIGIL) 100 MG tablet Take 1 tablet (100 mg total) by mouth daily. 08/02/19   Drema Dallas, DO  Multiple Vitamins-Minerals (MULTIVITAMIN WITH MINERALS) tablet Take 1 tablet by mouth daily.    [provider]  pregabalin (LYRICA) 75 MG capsule Take 75 mg by mouth 2 (two) times daily. 07/03/19   [provider]    Allergies Penicillins  Family History  Problem Relation Age of Onset  . Hypertension Mother   . Hypertension Father   . Multiple sclerosis Sister   . Aneurysm Brother      Social History Social History   Tobacco Use  . Smoking status: Current Every Day Smoker  . Smokeless tobacco: Never Used  Vaping Use  . Vaping Use: Never used  Substance Use Topics  . Alcohol use: No  . Drug use: Not on file    Review of Systems  Constitutional: Negative for fever. Cardiovascular: Negative for chest pain. Respiratory: Negative for shortness of breath. Gastrointestinal: Negative for abdominal pain, vomiting and diarrhea. Genitourinary: Negative for dysuria. Musculoskeletal: Negative for back pain. Reports chronic neck pain. Skin: Negative for rash. Neurological: Negative for headaches, focal weakness or numbness. ____________________________________________  PHYSICAL EXAM:  VITAL SIGNS: ED Triage Vitals [08/20/19 1140]  Enc Vitals Group     BP (!) 123/95     Pulse Rate 68     Resp 18     Temp 98 F (36.7 C)     Temp src      SpO2 100 %     Weight 140 lb (63.5 kg)     Height 5\' 7"  (1.702 m)     Head Circumference      Peak Flow      Pain Score 9     Pain Loc      Pain Edu?      Excl. in GC?     Constitutional: Alert and oriented. Well appearing and in no distress. Head: Normocephalic and atraumatic. Eyes: Conjunctivae are normal. Normal extraocular movements Neck:  Supple.  Cardiovascular: Normal rate, regular rhythm. Normal distal pulses. Respiratory: Normal respiratory effort. No wheezes/rales/rhonchi. Musculoskeletal: Nontender with normal range of motion in all extremities.  Neurologic:  Normal gait without ataxia. Normal speech and language. No gross focal neurologic deficits are appreciated. Skin:  Skin is warm, dry and intact. No rash noted. ____________________________________________  PROCEDURES  Methylprednisolone sodium succinate 1,000 mg IVPB  Procedures ____________________________________________  INITIAL IMPRESSION / ASSESSMENT AND PLAN / ED COURSE  Patient with a history of MS and chronic neck pain secondary to DDD  status post ACDF, presents with complaints of pain and request for steroid infusion.  The patient's labs overall benign reassuring at this time.  An IV infusion of methylprednisolone is provided at this request.  Patient is advised that he should follow-up with his neurologist and or pain management specialist for further management.  He is also advised to take his previously prescribed gabapentin and Flexeril for spasticity and pain relief.  Patient discharged to follow-up as discussed.  [name] was evaluated in Emergency Department on 08/20/2019 for the symptoms described in the history of present illness. He was evaluated in the context of the global COVID-19 pandemic, which necessitated consideration that the patient might be at risk for infection with the SARS-CoV-2 virus that causes COVID-19. Institutional protocols and algorithms that pertain to the evaluation of patients at risk for COVID-19 are in a state of rapid change based on information released by regulatory bodies including the CDC and federal and state organizations. These policies and algorithms were followed during the patient's care in the ED. ____________________________________________  FINAL CLINICAL IMPRESSION(S) / ED DIAGNOSES  Final diagnoses:  Neck pain, chronic  Musculoskeletal pain  MS (multiple sclerosis) (HCC)      Deklin Bieler, Charlesetta Ivory, PA-C 08/20/19 1818    Willy Eddy, MD 08/21/19 1201

## 2019-08-20 NOTE — ED Notes (Signed)
See triage note  Presents with neck pain  States he has a hx of MS and DDD  States prednisone helps with his pain

## 2019-08-20 NOTE — Discharge Instructions (Addendum)
Your exam is stable at this time. Restart your daily muscle relaxant and Lyrica as prescribed. Follow-up with your PCP or specialist for ongoing symptoms. Return as needed.

## 2019-08-21 ENCOUNTER — Other Ambulatory Visit: Payer: Self-pay | Admitting: Neurology

## 2019-08-21 ENCOUNTER — Encounter: Payer: Self-pay | Admitting: Student in an Organized Health Care Education/Training Program

## 2019-08-21 MED ORDER — MODAFINIL 100 MG PO TABS: 100.0000 mg | ORAL_TABLET | Freq: Every day | ORAL | 2 refills | Status: DC

## 2019-08-21 NOTE — Telephone Encounter (Signed)
Dawn at [facility] called in and left a message. They cannot fill the patient's prescription of modafinil and cannot transfer it due to it being a controlled substance. She would like to see if we can send [facility] [place], [place]

## 2019-08-21 NOTE — Telephone Encounter (Signed)
I [facility] [place]

## 2019-08-22 ENCOUNTER — Ambulatory Visit
Payer: 59 | Attending: Student in an Organized Health Care Education/Training Program | Admitting: Student in an Organized Health Care Education/Training Program

## 2019-08-22 ENCOUNTER — Encounter: Payer: Self-pay | Admitting: Student in an Organized Health Care Education/Training Program

## 2019-08-22 ENCOUNTER — Other Ambulatory Visit: Payer: Self-pay

## 2019-08-22 DIAGNOSIS — Q761 Klippel-Feil syndrome: Secondary | ICD-10-CM | POA: Diagnosis not present

## 2019-08-22 DIAGNOSIS — G35 Multiple sclerosis: Secondary | ICD-10-CM

## 2019-08-22 DIAGNOSIS — M5412 Radiculopathy, cervical region: Secondary | ICD-10-CM | POA: Diagnosis not present

## 2019-08-22 NOTE — Progress Notes (Signed)
Patient: Joshua [name]  Service Category: E/M  Provider: Gillis Santa, MD  DOB: 06/27/1970  DOS: 08/22/2019  Location: Office  MRN: [id number]  Setting: Ambulatory outpatient  Referring Provider: Remi Haggard, FNP  Type: Established Patient  Specialty: Interventional Pain Management  PCP: Remi Haggard, FNP  Location: Remote location  Delivery: TeleHealth     Virtual Encounter - Pain Management PROVIDER NOTE: Information contained herein reflects review and annotations entered in association with encounter. Interpretation of such information and data should be left to medically-trained personnel. Information provided to patient can be located elsewhere in the medical record under "Patient Instructions". Document created using STT-dictation technology, any transcriptional errors that may result from process are unintentional.    Contact & Pharmacy Preferred: [phone] Home: [phone] (home) Mobile: [phone] (mobile) E-mail: [name]  [facility] [address] [place], [place] [address] Phone: [phone] Fax: [phone]  [id number] [place] [id number]: [phone]: [phone]  Pre-screening  Joshua [name] offered "in-person" vs "virtual" encounter. [name]   Reason COVID-19*  Social distancing based on CDC and AMA recommendations.   I contacted Joshua [name] 08/22/2019 via video conference.      I clearly identified myself as BGillis Santa MD. I verified that I was speaking with the correct person using two identifiers (Name: Joshua [name]: [birth year].  Consent I sought verbal advanced consent from Joshua [name] virtual visit interactions. I informed Joshua [name] of possible security and privacy concerns, risks, and limitations associated with  providing "not-in-person" medical evaluation and management services. I also informed Joshua [name] of the availability of "in-person" appointments. Finally, I informed him that there would be a charge for the virtual visit and that he could be  personally, fully or partially, financially responsible for it. Joshua [name].   Historic Elements   Mr. TKHUP SAPIAis a 49y.o. year old, male patient evaluated today after his last contact with our practice on 07/18/2019. [name] has a past medical history of Allergy, Anxiety, Depression, Multiple sclerosis ([place], Neck pain, and Neuromuscular disorder ([place]. He also  has a past surgical history that includes Spinal fusion and Neck surgery (2014). Mr. AChampeauhas a current medication list which includes the following prescription(s): cyclobenzaprine, modafinil, multivitamin with minerals, and pregabalin. He  reports that he has been smoking. He has never used smokeless tobacco. He reports that he does not drink alcohol. No history on file for drug use. Joshua [name].   HPI  Today, he is being contacted for a post-procedure assessment.   Post-Procedure Evaluation  Procedure:   07/18/19  Type: Diagnostic, Inter-Laminar, Cervical Epidural Steroid Injection  #1  Region: Posterior Cervico-thoracic Region Level: C6-C7 Laterality: Midline Paramedial  Sedation: Please see nurses note.  Effectiveness during initial hour after procedure(Ultra-Short Term Relief): 100 %   Local anesthetic used: Long-acting (4-6 hours) Effectiveness: Defined as any analgesic benefit obtained secondary to the administration of local anesthetics. This carries significant diagnostic value as to the etiological location, or anatomical origin, of the pain. Duration of benefit is expected to coincide with the duration of the local anesthetic used.  Effectiveness during initial 4-6 hours after  procedure(Short-Term Relief): 100 %   Long-term benefit: Defined as any relief past  the pharmacologic duration of the local anesthetics.  Effectiveness past the initial 6 hours after procedure(Long-Term Relief): 100 % (had great pain relief x 2 days and then the pain came back.) Pain gradually returned to baseline by day 5.  Current benefits: Defined as benefit that persist at this time.   Analgesia:  No benefit Function: Back to baseline ROM: Back to baseline     Laboratory Chemistry Profile   Renal Lab Results  Component Value Date   BUN <5 (L) 04/12/2019   CREATININE 0.85 64/40/3474   BCR NOT APPLICABLE 25/95/6387   GFRAA >60 04/12/2019   GFRNONAA >60 04/12/2019     Hepatic Lab Results  Component Value Date   AST 18 01/03/2019   ALT 13 01/03/2019   ALBUMIN 3.7 05/28/2013   ALKPHOS 79 05/28/2013     Electrolytes Lab Results  Component Value Date   NA 136 04/12/2019   K 3.4 (L) 04/12/2019   CL 102 04/12/2019   CALCIUM 8.4 (L) 04/12/2019     Bone Lab Results  Component Value Date   VD25OH 17.37 (L) 01/03/2019     Inflammation (CRP: Acute Phase) (ESR: Chronic Phase) No results found for: CRP, ESRSEDRATE, LATICACIDVEN     Note: Above Lab results reviewed.   Assessment  The primary encounter diagnosis was Cervical radicular pain. Diagnoses of Cervical radiculopathy, Cervical fusion syndrome (C4-C7), and MS (multiple sclerosis) (HCC) were also pertinent to this visit.  Plan of Care  Joshua [name] has a current medication list which includes the following long-term medication(s): modafinil.   Virtual visit for post procedure assessment after midline C6-C7 epidural steroid injection.  The patient experienced approximately 80 to 100% pain relief for the first 2 days with return of his pain back to baseline by day 5.  Patient states that he is having trouble walking and keeping his neck upright due to severe pain and weakness that he feels.  He is applying  for disability at this time due to severe pain that limits his ability to work.    I discussed treatment plan with the patient.  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  Recommend referral to neurosurgery to consider surgical decompression and fusion at C3-C4.  MR C-Spine 02/22/19 C2-C3: Shallow central disc protrusion indents the ventral thecal sac. Mild bilateral facet hypertrophy. No significant spinal stenosis. Foramina remain patent.  C3-C4: Moderate sized central disc protrusion indents the ventral thecal sac, contacting the ventral spinal cord. Superimposed mild ligament flavum thickening. Resultant moderate spinal stenosis with minimal cord flattening. Superimposed uncovertebral hypertrophy with resultant moderate right and mild left C4 foraminal stenosis.  C4-C5: Prior fusion. No residual spinal stenosis. Foramina are patent.  C5-C6: Prior fusion. No residual spinal stenosis. Foramina are patent.  C6-C7: Prior fusion. No residual spinal stenosis. Foramina are patent.  C7-T1: Mild annular disc bulge. Advanced left-sided facet degeneration. No significant spinal stenosis. Foramina are patent.   These findings were also relayed to the patient's wife as the patient does have trouble with memory recall.  Orders:  Orders Placed This Encounter  Procedures  . Ambulatory referral to Neurosurgery    Referral Priority:   Routine    Referral Type:   Surgical  Referral Reason:   Specialty Services Required    Referred to Provider:   Meade Maw, MD    Requested Specialty:   Neurosurgery    Number of Visits Requested:   1   Follow-up plan:   No follow-ups on file.   Recent  Visits Date Type Provider Dept  07/18/19 Procedure visit Gillis Santa, MD [facility]  Showing recent visits within past 90 days and meeting all other requirements Today's Visits Date Type Provider Dept  08/22/19 Telemedicine Gillis Santa, MD [facility]  Showing today's visits and meeting all other requirements Future Appointments No visits were found meeting these conditions. Showing future appointments within next 90 days and meeting all other requirements  I discussed the assessment and treatment plan with the patient. The patient was provided an opportunity to ask questions and all were answered. The patient agreed with the plan and demonstrated an understanding of the instructions.  Patient advised to call back or seek an in-person evaluation if the symptoms or condition worsens.  Duration of encounter:25 minutes.  Note by: Gillis Santa, MD Date: 08/22/2019; Time: 3:04 PM

## 2019-08-23 NOTE — Telephone Encounter (Signed)
Left message with the after hour service on 08-23-19 @ 12:45 pm   Caller states he needs to get a RX refill for the infusion please call back

## 2019-08-23 NOTE — Telephone Encounter (Signed)
Spoke with pt and let him know that orders would be in place in time for his Sept 2021 dose of Ocrevus.

## 2019-08-30 ENCOUNTER — Telehealth: Payer: Self-pay | Admitting: Neurology

## 2019-08-30 ENCOUNTER — Other Ambulatory Visit: Payer: Self-pay

## 2019-08-30 DIAGNOSIS — G35 Multiple sclerosis: Secondary | ICD-10-CM

## 2019-08-30 DIAGNOSIS — M4802 Spinal stenosis, cervical region: Secondary | ICD-10-CM

## 2019-08-30 NOTE — Telephone Encounter (Signed)
Referral added pt is aware

## 2019-08-30 NOTE — Telephone Encounter (Signed)
He should speak with the pain doctor about that

## 2019-08-30 NOTE — Telephone Encounter (Signed)
Patient called in. He is having issues with his neck. He is scheduled for an epidural on Wednesday, but they have not been lasting. He is wondering about how to get surgery scheduled.

## 2019-08-30 NOTE — Telephone Encounter (Signed)
Telephone call to pt, Pt states he needs to know what to do. He already asked his pain management provider and they referred him back to [facility].   Per pt please help.The pain is unbearable.

## 2019-08-30 NOTE — Telephone Encounter (Signed)
Refer him to neurosurgery for cervical spinal stenosis

## 2019-09-05 ENCOUNTER — Other Ambulatory Visit: Payer: Self-pay

## 2019-09-05 ENCOUNTER — Ambulatory Visit (HOSPITAL_BASED_OUTPATIENT_CLINIC_OR_DEPARTMENT_OTHER): Payer: 59 | Admitting: Student in an Organized Health Care Education/Training Program

## 2019-09-05 ENCOUNTER — Encounter: Payer: Self-pay | Admitting: Student in an Organized Health Care Education/Training Program

## 2019-09-05 ENCOUNTER — Ambulatory Visit
Admission: RE | Admit: 2019-09-05 | Discharge: 2019-09-05 | Disposition: A | Discharge status: Home / Self Care | Payer: 59 | Source: Ambulatory Visit | Attending: Student in an Organized Health Care Education/Training Program | Admitting: Student in an Organized Health Care Education/Training Program

## 2019-09-05 DIAGNOSIS — M542 Cervicalgia: Secondary | ICD-10-CM | POA: Diagnosis not present

## 2019-09-05 DIAGNOSIS — M5412 Radiculopathy, cervical region: Secondary | ICD-10-CM

## 2019-09-05 DIAGNOSIS — M4802 Spinal stenosis, cervical region: Secondary | ICD-10-CM | POA: Insufficient documentation

## 2019-09-05 DIAGNOSIS — G35 Multiple sclerosis: Secondary | ICD-10-CM | POA: Diagnosis not present

## 2019-09-05 MED ORDER — IOHEXOL 180 MG/ML  SOLN
10.0000 mL | Freq: Once | INTRAMUSCULAR | Status: AC
  Administered 2019-09-05: 10 mL via EPIDURAL
  Filled 2019-09-05: qty 20

## 2019-09-05 MED ORDER — DEXAMETHASONE SODIUM PHOSPHATE 10 MG/ML IJ SOLN
10.0000 mg | Freq: Once | INTRAMUSCULAR | Status: AC
  Administered 2019-09-05: 10 mg
  Filled 2019-09-05: qty 1

## 2019-09-05 MED ORDER — FENTANYL CITRATE (PF) 100 MCG/2ML IJ SOLN
25.0000 ug | INTRAMUSCULAR | Status: DC | PRN
  Administered 2019-09-05: 50 ug via INTRAVENOUS
  Filled 2019-09-05: qty 2

## 2019-09-05 MED ORDER — ROPIVACAINE HCL 2 MG/ML IJ SOLN
1.0000 mL | Freq: Once | INTRAMUSCULAR | Status: AC
  Administered 2019-09-05: 1 mL via EPIDURAL
  Filled 2019-09-05: qty 10

## 2019-09-05 MED ORDER — LIDOCAINE HCL 2 % IJ SOLN
20.0000 mL | Freq: Once | INTRAMUSCULAR | Status: AC
  Administered 2019-09-05: 200 mg
  Filled 2019-09-05: qty 10

## 2019-09-05 MED ORDER — SODIUM CHLORIDE 0.9% FLUSH
1.0000 mL | Freq: Once | INTRAVENOUS | Status: AC
  Administered 2019-09-05: 1 mL

## 2019-09-05 MED ORDER — SODIUM CHLORIDE (PF) 0.9 % IJ SOLN
INTRAMUSCULAR | Status: AC
  Filled 2019-09-05: qty 10

## 2019-09-05 NOTE — Patient Instructions (Addendum)
Joshua [name] does NOT have a pain contract with the [facility].  We are focusing primarily on interventional pain management by way of cervical epidural steroid injections.  Medication management per neurology for management of MS and/or primary care provider.  Patient is not a candidate for chronic opioid therapy at this clinic for reasons documented in previous note.    ____________________________________________________________________________________________  Post-Procedure Discharge Instructions  Instructions:  Apply ice:   Purpose: This will minimize any swelling and discomfort after procedure.   When: Day of procedure, as soon as you get home.  How: Fill a plastic sandwich bag with crushed ice. Cover it with a small towel and apply to injection site.  How long: (15 min on, 15 min off) Apply for 15 minutes then remove x 15 minutes.  Repeat sequence on day of procedure, until you go to bed.  Apply heat:   Purpose: To treat any soreness and discomfort from the procedure.  When: Starting the next day after the procedure.  How: Apply heat to procedure site starting the day following the procedure.  How long: May continue to repeat daily, until discomfort goes away.  Food intake: Start with clear liquids (like water) and advance to regular food, as tolerated.   Physical activities: Keep activities to a minimum for the first 8 hours after the procedure. After that, then as tolerated.  Driving: If you have received any sedation, be responsible and do not drive. You are not allowed to drive for 24 hours after having sedation.  Blood thinner: (Applies only to those taking blood thinners) You may restart your blood thinner 6 hours after your procedure.  Insulin: (Applies only to Diabetic patients taking insulin) As soon as you can eat, you may resume your normal dosing schedule.  Infection prevention: Keep procedure site clean and dry. Shower daily and clean area with  soap and water.  Post-procedure Pain Diary: Extremely important that this be done correctly and accurately. Recorded information will be used to determine the next step in treatment. For the purpose of accuracy, follow these rules:  Evaluate only the area treated. Do not report or include pain from an untreated area. For the purpose of this evaluation, ignore all other areas of pain, except for the treated area.  After your procedure, avoid taking a long nap and attempting to complete the pain diary after you wake up. Instead, set your alarm clock to go off every hour, on the hour, for the initial 8 hours after the procedure. Document the duration of the numbing medicine, and the relief you are getting from it.  Do not go to sleep and attempt to complete it later. It will not be accurate. If you received sedation, it is likely that you were given a medication that may cause amnesia. Because of this, completing the diary at a later time may cause the information to be inaccurate. This information is needed to plan your care.  Follow-up appointment: Keep your post-procedure follow-up evaluation appointment after the procedure (usually 2 weeks for most procedures, 6 weeks for radiofrequencies). DO NOT FORGET to bring you pain diary with you.   Expect: (What should I expect to see with my procedure?)  From numbing medicine (AKA: Local Anesthetics): Numbness or decrease in pain. You may also experience some weakness, which if present, could last for the duration of the local anesthetic.  Onset: Full effect within 15 minutes of injected.  Duration: It will depend on the type of local anesthetic used.  On the average, 1 to 8 hours.   From steroids (Applies only if steroids were used): Decrease in swelling or inflammation. Once inflammation is improved, relief of the pain will follow.  Onset of benefits: Depends on the amount of swelling present. The more swelling, the longer it will take for the  benefits to be seen. In some cases, up to 10 days.  Duration: Steroids will stay in the system x 2 weeks. Duration of benefits will depend on multiple posibilities including persistent irritating factors.  Side-effects: If present, they may typically last 2 weeks (the duration of the steroids).  Frequent: Cramps (if they occur, drink Gatorade and take over-the-counter Magnesium 450-500 mg once to twice a day); water retention with temporary weight gain; increases in blood sugar; decreased immune system response; increased appetite.  Occasional: Facial flushing (red, warm cheeks); mood swings; menstrual changes.  Uncommon: Long-term decrease or suppression of natural hormones; bone thinning. (These are more common with higher doses or more frequent use. This is why we prefer that our patients avoid having any injection therapies in other practices.)   Very Rare: Severe mood changes; psychosis; aseptic necrosis.  From procedure: Some discomfort is to be expected once the numbing medicine wears off. This should be minimal if ice and heat are applied as instructed.  Call if: (When should I call?)  You experience numbness and weakness that gets worse with time, as opposed to wearing off.  New onset bowel or bladder incontinence. (Applies only to procedures done in the spine)  Emergency Numbers:  Durning business hours (Monday - Thursday, 8:00 AM - 4:00 PM) (Friday, 9:00 AM - 12:00 Noon): [phone]  After hours: [phone]  NOTE: If you are having a problem and are unable connect with, or to talk to a provider, then go to your nearest urgent care or emergency department. If the problem is serious and urgent, please call 911. ____________________________________________________________________________________________   Pain Scale Information, Adult A pain scale is a tool to help you describe your pain. A pain scale often uses pictures, numbers, or words. It can help you explain to  your health care provider:  What your pain feels like, such as dull, achy, throbbing, or sharp.  Where pain is located in your body.  How often you have pain. Pain scales range from simple to complex. Which pain scale your health care provider uses depends on your condition. Some pain scales only measure pain intensity. These can be useful if the cause of your pain is known. Other pain scales measure more factors, including if you are able to do your usual activities and how the pain is affecting your mood. These scales are useful if you have long-term (chronic) pain. How is a pain scale used? A pain scale may be used in your health care provider's office or in the hospital. Pain scales for adults are usually in the form of a survey. Your health care provider will ask you the questions on the pain scale or have you fill out a form. Your health care provider may also give you a pain scale to use at home. If you have chronic pain, you may use a pain scale for several weeks or months. Keeping a record of your pain symptoms helps your health care provider see how your pain changes over time. Your health care provider can use a pain scale rating to guide your treatment plan. Why is it important to communicate about pain? Being in pain can make you  feel unwell and have negative feelings. It can interfere with your daily activities, such as work, school, hobbies, or relationships. Pain can be a sign you have a condition that needs to be treated. A pain scale can help you describe your pain so your health care provider has a better idea of what you are feeling and how to treat your condition. What are some questions to ask my health care provider?  How accurate are the results of this pain scale?  How often should I use a pain scale?  What is causing my pain?  How long will I need treatment?  What are the risks of treatment with medicines?  What other treatments can help?  What if my pain does not  go away with treatment?  Should I keep a record of pain symptoms or pain scale results at home? This information is not intended to replace advice given to you by your health care provider. Make sure you discuss any questions you have with your health care provider. Document Revised: 02/04/2016 Document Reviewed: 02/02/2015 Elsevier Patient Education  2020 [employer].

## 2019-09-05 NOTE — Progress Notes (Signed)
Safety precautions to be maintained throughout the outpatient stay will include: orient to surroundings, keep bed in low position, maintain call bell within reach at all times, provide assistance with transfer out of bed and ambulation.  

## 2019-09-05 NOTE — Progress Notes (Signed)
PROVIDER NOTE: Information contained herein reflects review and annotations entered in association with encounter. Interpretation of such information and data should be left to medically-trained personnel. Information provided to patient can be located elsewhere in the medical record under "Patient Instructions". Document created using STT-dictation technology, any transcriptional errors that may result from process are unintentional.    Patient: Joshua [name]  Service Category: Procedure  Provider: Edward Jolly, MD  DOB: 07-May-1970  DOS: 09/05/2019  Location: [facility]  MRN: [id number]  Setting: Ambulatory - outpatient  Referring Provider: Edward Jolly, MD  Type: Established Patient  Specialty: Interventional Pain Management  PCP: Joshua Gang, FNP   Primary Reason for Visit: Interventional Pain Management Treatment. CC: Neck Pain  Procedure:          Anesthesia, Analgesia, Anxiolysis:  Type: Therapeutic, Inter-Laminar, Cervical Epidural Steroid Injection  #2 (#1 done 08/18/19) Region: Posterior Cervico-thoracic Region Level: C6-C7 Laterality: Midline Paramedial  Type: Moderate (Conscious) Sedation combined with Local Anesthesia Indication(s): Analgesia and Anxiety Route: Intravenous (IV) IV Access: Secured Sedation: Meaningful verbal contact was maintained at all times during the procedure  Local Anesthetic: Lidocaine 1-2%  Position: Prone with head of the table was raised to facilitate breathing.   Indications: 1. Cervical radicular pain   2. Cervical radiculopathy    Pain Score: Pre-procedure: 6/10 Post-procedure: 0/10   Patient presents today for his second cervical epidural steroid injection.  His first one provided him with short-term pain relief which was significant however.  I have reviewed the patient's cervical MRI and given his C3-C4 pathology which shows moderate sized central disc protrusion indenting the ventral thecal sac causing moderate to  severe spinal stenosis with resultant moderate right and mild left C4 foraminal stenosis.  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  His pain experience is further compounded by his multiple sclerosis.  Patient is requesting "pain medications".  Please refer to previous notes: patient does not have a pain contract with our pain clinic and I informed him initially that we will be focusing on interventional pain therapy given his prior history of substance abuse (cocaine) which is currently not active.  He does utilize marijuana to help with his MS.  From a legal standpoint I cannot prescribe a controlled substance especially a C-2  For patient that is utilizing marijuana on a regular basis.  Patient endorsed understanding.  Pre-op Assessment:  Joshua [name] is a 49 y.o. (year old), male patient, seen today for interventional treatment. He  has a past surgical history that includes Spinal fusion and Neck surgery (2014). Joshua [name] has a current medication list which includes the following prescription(s): cyclobenzaprine, modafinil, multivitamin with minerals, and pregabalin, and the following Facility-Administered Medications: fentanyl. His primarily concern today is the Neck Pain  Initial Vital Signs:  Pulse/HCG Rate: 92ECG Heart Rate: 74 Temp:   Resp: 16 BP: 109/76 SpO2: 100 %  BMI: Estimated body mass index is 21.93 kg/m as calculated from the following:   Height as of 08/20/19: 5\' 7"  (1.702 m).   Weight as of 08/20/19: 140 lb (63.5 kg).  Risk Assessment: Allergies: Reviewed. He is allergic to penicillins.  Allergy Precautions: None required Coagulopathies: Reviewed. None identified.  Blood-thinner therapy: None at this time Active Infection(s): Reviewed. None identified. Joshua [name] is afebrile  Site Confirmation: Joshua [name] was asked to confirm the procedure and laterality before marking the site Procedure checklist: Completed Consent: Before the  procedure and under the influence of  no sedative(s), amnesic(s), or anxiolytics, the patient was informed of the treatment options, risks and possible complications. To fulfill our ethical and legal obligations, as recommended by the American Medical Association's Code of Ethics, I have informed the patient of my clinical impression; the nature and purpose of the treatment or procedure; the risks, benefits, and possible complications of the intervention; the alternatives, including doing nothing; the risk(s) and benefit(s) of the alternative treatment(s) or procedure(s); and the risk(s) and benefit(s) of doing nothing. The patient was provided information about the general risks and possible complications associated with the procedure. These may include, but are not limited to: failure to achieve desired goals, infection, bleeding, organ or nerve damage, allergic reactions, paralysis, and death. In addition, the patient was informed of those risks and complications associated to Spine-related procedures, such as failure to decrease pain; infection (i.e.: Meningitis, epidural or intraspinal abscess); bleeding (i.e.: epidural hematoma, subarachnoid hemorrhage, or any other type of intraspinal or peri-dural bleeding); organ or nerve damage (i.e.: Any type of peripheral nerve, nerve root, or spinal cord injury) with subsequent damage to sensory, motor, and/or autonomic systems, resulting in permanent pain, numbness, and/or weakness of one or several areas of the body; allergic reactions; (i.e.: anaphylactic reaction); and/or death. Furthermore, the patient was informed of those risks and complications associated with the medications. These include, but are not limited to: allergic reactions (i.e.: anaphylactic or anaphylactoid reaction(s)); adrenal axis suppression; blood sugar elevation that in diabetics may result in ketoacidosis or comma; water retention that in patients with history of congestive heart failure  may result in shortness of breath, pulmonary edema, and decompensation with resultant heart failure; weight gain; swelling or edema; medication-induced neural toxicity; particulate matter embolism and blood vessel occlusion with resultant organ, and/or nervous system infarction; and/or aseptic necrosis of one or more joints. Finally, the patient was informed that Medicine is not an exact science; therefore, there is also the possibility of unforeseen or unpredictable risks and/or possible complications that may result in a catastrophic outcome. The patient indicated having understood very clearly. We have given the patient no guarantees and we have made no promises. Enough time was given to the patient to ask questions, all of which were answered to the patient's satisfaction. Mr. Cadman has indicated that he wanted to continue with the procedure. Attestation: I, the ordering provider, attest that I have discussed with the patient the benefits, risks, side-effects, alternatives, likelihood of achieving goals, and potential problems during recovery for the procedure that I have provided informed consent. Date  Time: 09/05/2019 11:04 AM  Pre-Procedure Preparation:  Monitoring: As per clinic protocol. Respiration, ETCO2, SpO2, BP, heart rate and rhythm monitor placed and checked for adequate function Safety Precautions: Patient was assessed for positional comfort and pressure points before starting the procedure. Time-out: I initiated and conducted the "Time-out" before starting the procedure, as per protocol. The patient was asked to participate by confirming the accuracy of the "Time Out" information. Verification of the correct person, site, and procedure were performed and confirmed by me, the nursing staff, and the patient. "Time-out" conducted as per Joint Commission's Universal Protocol (UP.01.01.01). Time: 1202  Description of Procedure:          Target Area: For Epidural Steroid injections the  target is the interlaminar space, initially targeting the lower border of the superior vertebral body lamina. Approach: Paramedial approach. Area Prepped: Entire PosteriorCervical Region DuraPrep (Iodine Povacrylex [0.7% available iodine] and Isopropyl Alcohol, 74% w/w) Safety Precautions: Aspiration looking for blood  return was conducted prior to all injections. At no point did we inject any substances, as a needle was being advanced. No attempts were made at seeking any paresthesias. Safe injection practices and needle disposal techniques used. Medications properly checked for expiration dates. SDV (single dose vial) medications used. Description of the Procedure: Protocol guidelines were followed. The procedure needle was introduced through the skin, ipsilateral to the reported pain, and advanced to the target area. Bone was contacted and the needle walked caudad, until the lamina was cleared. The epidural space was identified using "loss-of-resistance technique" with 2-3 ml of PF-NaCl (0.9% NSS), in a 5cc LOR glass syringe. Vitals:   09/05/19 1212 09/05/19 1218 09/05/19 1228 09/05/19 1238  BP: (!) 120/96 112/80 120/77 121/72  Pulse:      Resp: 13 16 14 15   SpO2: 95% 100% 100% 100%    Start Time: 1202 hrs. End Time: 1211 hrs. Materials:  Needle(s) Type: Epidural needle Gauge: 22G Length: 3.5-in Medication(s): Please see orders for medications and dosing details. 4 cc solution made of 2 cc of preservative-free saline, 1 cc of 0.2% ropivacaine, 1 cc of Decadron 10 mg/cc.  Imaging Guidance (Spinal):          Type of Imaging Technique: Fluoroscopy Guidance (Spinal) Indication(s): Assistance in needle guidance and placement for procedures requiring needle placement in or near specific anatomical locations not easily accessible without such assistance. Exposure Time: Please see nurses notes. Contrast: Before injecting any contrast, we confirmed that the patient did not have an allergy to  iodine, shellfish, or radiological contrast. Once satisfactory needle placement was completed at the desired level, radiological contrast was injected. Contrast injected under live fluoroscopy. No contrast complications. See chart for type and volume of contrast used. Fluoroscopic Guidance: I was personally present during the use of fluoroscopy. "Tunnel Vision Technique" used to obtain the best possible view of the target area. Parallax error corrected before commencing the procedure. "Direction-depth-direction" technique used to introduce the needle under continuous pulsed fluoroscopy. Once target was reached, antero-posterior, oblique, and lateral fluoroscopic projection used confirm needle placement in all planes. Images permanently stored in EMR. Interpretation: I personally interpreted the imaging intraoperatively. Adequate needle placement confirmed in multiple planes. Appropriate spread of contrast into desired area was observed. No evidence of afferent or efferent intravascular uptake. No intrathecal or subarachnoid spread observed. Permanent images saved into the patient's record.   Post-operative Assessment:  Post-procedure Vital Signs:  Pulse/HCG Rate: 9261 Temp:   Resp: 15 BP: 121/72 SpO2: 100 %  EBL: None  Complications: No immediate post-treatment complications observed by team, or reported by patient.  Note: The patient tolerated the entire procedure well. A repeat set of vitals were taken after the procedure and the patient was kept under observation following institutional policy, for this type of procedure. Post-procedural neurological assessment was performed, showing return to baseline, prior to discharge. The patient was provided with post-procedure discharge instructions, including a section on how to identify potential problems. Should any problems arise concerning this procedure, the patient was given instructions to immediately contact , at any time, without hesitation. In  any case, we plan to contact the patient by telephone for a follow-up status report regarding this interventional procedure.  Comments:  No additional relevant information.  Plan of Care  Orders:  Orders Placed This Encounter  Procedures  . DG PAIN CLINIC C-ARM 1-60 MIN NO REPORT    Intraoperative interpretation by procedural physician at [facility].    Standing Status:  Standing    Number of Occurrences:   1    Order Specific Question:   Reason for exam:    Answer:   Assistance in needle guidance and placement for procedures requiring needle placement in or near specific anatomical locations not easily accessible without such assistance.   Medications ordered for procedure: Meds ordered this encounter  Medications  . lidocaine (XYLOCAINE) 2 % (with pres) injection 400 mg  . fentaNYL (SUBLIMAZE) injection 25-50 mcg    Make sure Narcan is available in the pyxis when using this medication. In the event of respiratory depression (RR< 8/min): Titrate NARCAN (naloxone) in increments of 0.1 to 0.2 mg IV at 2-3 minute intervals, until desired degree of reversal.  . ropivacaine (PF) 2 mg/mL (0.2%) (NAROPIN) injection 1 mL  . sodium chloride flush (NS) 0.9 % injection 1 mL  . dexamethasone (DECADRON) injection 10 mg  . iohexol (OMNIPAQUE) 180 MG/ML injection 10 mL    Must be Myelogram-compatible. If not available, you may substitute with a water-soluble, non-ionic, hypoallergenic, myelogram-compatible radiological contrast medium.   Medications administered: We administered lidocaine, fentaNYL, ropivacaine (PF) 2 mg/mL (0.2%), sodium chloride flush, dexamethasone, and iohexol.  See the medical record for exact dosing, route, and time of administration.  Follow-up plan:   Return if symptoms worsen or fail to improve.         Recent Visits Date Type Provider Dept  08/22/19 Telemedicine Joshua Jolly, MD [facility]  07/18/19 Procedure visit Joshua Jolly, MD  [facility]  Showing recent visits within past 90 days and meeting all other requirements Today's Visits Date Type Provider Dept  09/05/19 Procedure visit Joshua Jolly, MD [facility]  Showing today's visits and meeting all other requirements Future Appointments No visits were found meeting these conditions. Showing future appointments within next 90 days and meeting all other requirements  Disposition: Discharge home  Discharge (Date  Time): 09/05/2019; 1241 hrs.   Primary Care Physician: Joshua Gang, FNP Location: [facility] Note by: Joshua Jolly, MD Date: 09/05/2019; Time: 2:31 PM  Disclaimer:  Medicine is not an exact science. The only guarantee in medicine is that nothing is guaranteed. It is important to note that the decision to proceed with this intervention was based on the information collected from the patient. The Data and conclusions were drawn from the patient's questionnaire, the interview, and the physical examination. Because the information was provided in large part by the patient, it cannot be guaranteed that it has not been purposely or unconsciously manipulated. Every effort has been made to obtain as much relevant data as possible for this evaluation. It is important to note that the conclusions that lead to this procedure are derived in large part from the available data. Always take into account that the treatment will also be dependent on availability of resources and existing treatment guidelines, considered by other Pain Management Practitioners as being common knowledge and practice, at the time of the intervention. For Medico-Legal purposes, it is also important to point out that variation in procedural techniques and pharmacological choices are the acceptable norm. The indications, contraindications, technique, and results of the above procedure should only be interpreted and judged by a Board-Certified  Interventional Pain Specialist with extensive familiarity and expertise in the same exact procedure and technique.

## 2019-09-12 ENCOUNTER — Telehealth: Payer: Self-pay | Admitting: Student in an Organized Health Care Education/Training Program

## 2019-09-12 NOTE — Telephone Encounter (Signed)
Spoke with patient, pain started back on Sunday evening and Monday morning the pain was more severe.  He states that the pain is a little better than pre-procedure but it is still bad.  States is having vibration pain from under arm pits to his fingers.  States he does have an appt scheduled with Dr Mickie Hillier tomorrow at 1300.  I told Joshua [name] that I would check with DR Cherylann Ratel to see if there is anything different he would do.  Patient is agreeable to this.

## 2019-09-12 NOTE — Telephone Encounter (Signed)
Pt left a voicemail stating that he is having severe pain in his neck and he just had a CESI last week on 8/4 and wants to know what he should do.

## 2019-09-12 NOTE — Telephone Encounter (Signed)
Called patient back to let him know that Dr Cherylann Ratel will not do any thing different at this time and would like him to keep his referral appt with DR Myer Haff on tomorrow, 09/13/19.  Patient is agreeable to this.

## 2019-09-20 ENCOUNTER — Other Ambulatory Visit: Payer: Self-pay | Admitting: Neurosurgery

## 2019-09-20 DIAGNOSIS — Z981 Arthrodesis status: Secondary | ICD-10-CM

## 2019-09-20 DIAGNOSIS — M542 Cervicalgia: Secondary | ICD-10-CM

## 2019-09-20 NOTE — Progress Notes (Signed)
NEUROLOGY FOLLOW UP OFFICE NOTE  [name] [phone]  HISTORY OF PRESENT ILLNESS: Joshua [name] is a 49 year old left-handed African American male who follows up for multiple sclerosis.  UPDATE: Current DMT:  Ocre[facility]  Current medications:  Modafinil 100mg , Flexeril 10mg , Lyrica 75mg  BID (he says it makes him stutter)  02/13/2019 MRI BRAIN W WO:  1. Mild patchy T2/FLAIR signal abnormality involving the periventricular and juxta cortical supratentorial cerebral white matter, consistent with multiple sclerosis. Overall, appearance is mildly progressed relative to 2013. No evidence for active demyelination.  2. Underlying mildly progressed cerebral atrophy for age.  3. Chronic right maxillary sinusitis. 02/13/2019 MRI CERVICAL SPINE W WO:  1. Patchy multifocal cord signal abnormality involving the cervical spinal cord as above, consistent with history of multiple sclerosis.  Overall, appearance is minimally progressed relative to 2015. No evidence for active demyelination.  2. Prior ACDF at C4 through C7 without residual stenosis.  3. Adjacent segment disease with central disc protrusion at C3-4 indenting the ventral thecal sac with resultant moderate spinal stenosis and moderate right and mild left C4 foraminal stenosis.  Vision:  No issues Motor:  No weakness.  He reports that sometimes his legs start shaking. Sensory:  Paresthesias involving all extremities.  NCV-EMG from 05/27/2014 showed right ulnar neuropathy Pain:  Chronic diffuse pain.  Chronic neck pain.  Chronic back pain.  Chronic chest pain.  Referred to pain management, Dr. 2014.  Underwent C6-C7 inter-laminar epidural steroid injection which was effective for 2 days and then returned.    Given his ongoing pain and evidence of C3-4 disc protrusion causing spinal stenosis, he was referred to neurosurgery.  He saw neurosurgery yesterday.  It was not clear if his ACDF is fully fused and uncertain if his symptoms are related to  cervical stenosis or MS lesions.  Physical therapy was first recommended and plan to get a CT to evaluate his fusion.   Gait:  Unsteady gait.   Bowel/Bladder:  No issues Fatigue:  Daytime fatigue.  Poor sleep Cognition:  Reports short term memory deficits.  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 Mood:  Depressed Erectile dysfunction  HISTORY: Initially presented with hand numbness and diffuse pain.  MRI of brain and cervical cord at the time revealed numerous periventricular and cervical spinal cord lesions.  He did not follow up with neurology and continued to experience diffuse pain, arm and hand numbness, unsteady gait with falls and "MS hug".  Other pertinent history: 2005 Subdural hematoma: "spontaneous bleed" in setting of cocaine use and high blood pressure s/p surgery on the left skull region.  07/12/2013 ACDF C4-5 and C6-7.  Headaches resolved.  Still with neck pain. Right ulnar neuropathy on NCV-EMG 05/27/2014. History of alcoholism and drug addiction.  Family History:  Joshua [name] (diagnosed with MS in her 52s)  Past disease modifying therapy:  Tecfidera (reports that it didn't work) Past medications:  Cymbalta 30mg  daily; gabapentin  Imaging: 11/13/2009 MRI BRAIN W WO:  Multiple periventricular, deep and subcortical white matter lesions, including characteristic perpendicular lesion adjacent to the right ventricle. 11/13/2009 MRI CERVICAL & THORACIC SPINE W WO:  T2/STIR hyperintense lesions in the cervical medullary junction, C5, T1-2, T7-8, and T9  vertebral levels without evidence of enhancement.  Multilevel degenerative changes of the cervical spine. 07/07/2010 MRI CERVICAL SPINE W WO (personally reviewed):  Nonenhancing lesions at C1 and C5 levels. 01/04/2012 MRI BRAIN W WO (personally reviewed):  Two tiny  nonspecific nonenhancing hyperintense foci in subcortical white matter. 05/17/2013 MRI CERVICAL SPINE WO:  Multiple nonenhancing  lesions within spinal cord, reportedly stable compared to imaging from 07/07/2010.  Spondylosis with right paracentral disc herniation without neural compression at C3-4, broad based disc protrusion with bilateral neural foraminal stenosis compressing C7 nerve roots at C6-7, and left-sided facet arthropathy with edema at C7-T1 09/11/2013 MRI CERVICAL SPINE WO:  S/p ACDF at C4-5 and C6-7, increased disc herniation and spinal stenosis at C3-4, chronjic left facet arthropathy at C7-T1. 06/07/2014 MRI BRAIN W WO: Multiple T2 hyperintense lesions within periventricular and subcortical white matter without abnormal enhancement. 06/07/2014 MRI BRAIN W WO: Multiple T2 hyperintense peripheral cervical spinal cord lesions from the level of the C1 arch to the T2 level without abnormal enhancement.  Status post C4-C7 ACDF.  Small disc bulge at C3-C4 resulting in mild spinal canal stenosis.  Labs: 04/22/2014 positive JC Virus Ab with index 1.28 Reportedly underwent lumbar puncture for CSF analysis, revealing 10 oligoclonal bands.  PAST MEDICAL HISTORY: Past Medical History:  Diagnosis Date  . Allergy   . Anxiety   . Depression   . Multiple sclerosis (HCC)   . Neck pain   . Neuromuscular disorder [facility])     MEDICATIONS: Current Outpatient Medications on File Prior to Visit  Medication Sig Dispense Refill  . cyclobenzaprine (FLEXERIL) 10 MG tablet Take 10 mg by mouth 3 (three) times daily as needed for muscle spasms.    . modafinil (PROVIGIL) 100 MG tablet Take 1 tablet (100 mg total) by mouth daily. 30 tablet 2  . Multiple Vitamins-Minerals (MULTIVITAMIN WITH MINERALS) tablet Take 1 tablet by mouth daily.    . pregabalin (LYRICA) 75 MG capsule Take 75 mg by mouth 2 (two) times daily.      No current facility-administered medications on file prior to visit.    ALLERGIES: Allergies  Allergen Reactions  . Penicillins Hives    As a child     FAMILY HISTORY: Family History  Problem Relation Age of  Onset  . Hypertension Mother   . Hypertension Father   . Multiple sclerosis Joshua [name]   . Aneurysm Brother     SOCIAL HISTORY: Social History   Socioeconomic History  . Marital status: Married    Spouse name: Not on file  . Number of children: 2  . Years of education: Not on file  . Highest education level: High school graduate  Occupational History  . Not on file  Tobacco Use  . Smoking status: Current Every Day Smoker  . Smokeless tobacco: Never Used  Vaping Use  . Vaping Use: Never used  Substance and Sexual Activity  . Alcohol use: No  . Drug use: Not on file  . Sexual activity: Not on file  Other Topics Concern  . Not on file  Social History Narrative   Pt is married he lives with his wife   He has 2 children   Left handed also can write with right handed   Drinks coffee every morning, tea sometime, soda not often    Social Determinants of Health   Financial Resource Strain:   . Difficulty of Paying Living Expenses: Not on file  Food Insecurity:   . Worried About Programme researcher, broadcasting/film/video in the Last Year: Not on file  . Ran Out of Food in the Last Year: Not on file  Transportation Needs:   . Lack of Transportation (Medical): Not on file  . Lack of Transportation (Non-Medical): Not on  file  Physical Activity:   . Days of Exercise per Week: Not on file  . Minutes of Exercise per Session: Not on file  Stress:   . Feeling of Stress : Not on file  Social Connections:   . Frequency of Communication with Friends and Family: Not on file  . Frequency of Social Gatherings with Friends and Family: Not on file  . Attends Religious Services: Not on file  . Active Member of Clubs or Organizations: Not on file  . Attends Banker Meetings: Not on file  . Marital Status: Not on file  Intimate Partner Violence:   . Fear of Current or Ex-Partner: Not on file  . Emotionally Abused: Not on file  . Physically Abused: Not on file  . Sexually Abused: Not on file     PHYSICAL EXAM: Blood pressure 121/76, pulse 89, height 5\' 7"  (1.702 m), weight 141 lb (64 kg), SpO2 98 %. General: No acute distress.  Patient appears well-groomed.   Head:  Normocephalic/atraumatic Eyes:  Fundi examined but not visualized Neck: supple, no paraspinal tenderness, full range of motion Heart:  Regular rate and rhythm Lungs:  Clear to auscultation bilaterally Back: No paraspinal tenderness Neurological Exam: alert and oriented to person, place, and time. Attention span and concentration intact, recent and remote memory intact, fund of knowledge intact.  Speech fluent and not dysarthric, language intact.  CN II-XII intact. Bulk normal.  Tone slightly increased, muscle strength 5/5 throughout.  Sensation to pinprick and vibration intact.  Deep tendon reflexes 2+ throughout except 3+ knees, toes downgoing.  Finger to nose and heel to shin testing intact.  Gait slightly wide-based.  Romberg negative  IMPRESSION: 1.  Multiple sclerosis 2.  Cervical spinal stenosis s/p ACDF 3.  Chronic neck pain secondary to cervical spinal stenosis and MS 4.  MS-related fatigue.  PLAN: 1.  Ocrevus 2.  Check vitamin D level 3.  For pain, stop Lyrica (causes stuttering) and will start nortriptyline 10mg  at bedtime for a week, then 20mg  at bedtime 4.  Modafinil for fatigue 5.  Repeat MRI of brain and cervical spine with and without contrast in 6 months 6.  Follow up with me after repeat MRI  , DO  CC:  , FNP

## 2019-09-21 ENCOUNTER — Other Ambulatory Visit: Payer: Self-pay

## 2019-09-21 ENCOUNTER — Encounter: Payer: Self-pay | Admitting: Neurology

## 2019-09-21 ENCOUNTER — Other Ambulatory Visit: Payer: 59

## 2019-09-21 ENCOUNTER — Telehealth: Payer: Self-pay | Admitting: Neurology

## 2019-09-21 ENCOUNTER — Ambulatory Visit: Payer: 59 | Admitting: Neurology

## 2019-09-21 VITALS — BP 121/76 | HR 89 | Ht 67.0 in | Wt 141.0 lb

## 2019-09-21 DIAGNOSIS — M4802 Spinal stenosis, cervical region: Secondary | ICD-10-CM | POA: Diagnosis not present

## 2019-09-21 DIAGNOSIS — G35 Multiple sclerosis: Secondary | ICD-10-CM

## 2019-09-21 LAB — VITAMIN D 25 HYDROXY (VIT D DEFICIENCY, FRACTURES): VITD: 22.08 ng/mL — ABNORMAL LOW (ref 30.00–100.00)

## 2019-09-21 MED ORDER — NORTRIPTYLINE HCL 10 MG PO CAPS: ORAL_CAPSULE | ORAL | 0 refills | Status: DC

## 2019-09-21 NOTE — Telephone Encounter (Signed)
Patient is requesting something be sent into the pharmacy to help control "shaking while having an MRI test."  Medical [address] in [place]

## 2019-09-21 NOTE — Patient Instructions (Addendum)
1.  Continue Ocrevus 2.  Stop Lyrica.  Start nortriptyline 10mg  at bedtime.  Increase to 20mg  at bedtime in one week.  If pain not improved in 5 weeks, contact me and we can increase dose. 3.  Check vitamin D level 4.  Repeat MRI of brain with and without contrast in 6 months. We have sent a referral to [facility] for your MRI and they will call you directly to schedule your appointment. They are located at [address] [facility]. If you need to contact them directly please call [phone].  5.  Follow up in 6 months (after MRI)

## 2019-09-24 ENCOUNTER — Telehealth: Payer: Self-pay

## 2019-09-24 NOTE — Telephone Encounter (Signed)
Pt advised of his Lab results and to start D3 4000 IU daily

## 2019-09-24 NOTE — Telephone Encounter (Signed)
-----   Message from Joshua Dallas, DO sent at 09/24/2019  7:38 AM EDT ----- Vitamin D level is low.  Please ask patient to start OTC D3 4000 IU daily.

## 2019-09-24 NOTE — Telephone Encounter (Signed)
PT advised to call back right before his visit to have medication called in per DR. Everlena Cooper

## 2019-09-26 ENCOUNTER — Other Ambulatory Visit: Payer: Self-pay

## 2019-09-26 ENCOUNTER — Telehealth: Payer: Self-pay | Admitting: Neurology

## 2019-09-26 ENCOUNTER — Ambulatory Visit
Admission: RE | Admit: 2019-09-26 | Discharge: 2019-09-26 | Disposition: A | Discharge status: Home / Self Care | Payer: 59 | Source: Ambulatory Visit | Attending: Neurosurgery | Admitting: Neurosurgery

## 2019-09-26 DIAGNOSIS — Z981 Arthrodesis status: Secondary | ICD-10-CM | POA: Diagnosis present

## 2019-09-26 DIAGNOSIS — M542 Cervicalgia: Secondary | ICD-10-CM | POA: Insufficient documentation

## 2019-09-26 NOTE — Telephone Encounter (Signed)
Received vm msg from [facility] that patient is scheduled with Dr. Venetia Maxon for 10/17/19.

## 2019-10-11 ENCOUNTER — Encounter: Payer: Self-pay | Admitting: Neurology

## 2019-10-11 NOTE — Progress Notes (Signed)
Received fax from [facility] stating patient needs PA. Gave [employer] contact info as [phone]. Called [employer] to complete PA and rep told me that there is already a valid PA on file starting 04/20/19 to 04/19/20. Faxed info back to [facility] @ [phone].

## 2019-10-19 ENCOUNTER — Other Ambulatory Visit: Payer: Self-pay | Admitting: Neurology

## 2019-10-19 ENCOUNTER — Telehealth: Payer: Self-pay | Admitting: Neurology

## 2019-10-19 MED ORDER — DIAZEPAM 5 MG PO TABS: ORAL_TABLET | ORAL | 0 refills | Status: DC

## 2019-10-19 NOTE — Telephone Encounter (Signed)
Called patient and advised him that RX has been sent and that he needs a driver to and from his MRI as medication will make him drowsy. Patient verbalized understanding.

## 2019-10-19 NOTE — Telephone Encounter (Signed)
Script sent.  He must have a driver to and from the test as medication causes drowsiness.

## 2019-10-19 NOTE — Telephone Encounter (Signed)
Patient has an MRI scheduled 10/20/19. He was told he would have a medication to help him relax during the MRI sent to [facility] in [place] but was told that they haven't received the prescription yet. Please call.

## 2019-10-20 ENCOUNTER — Ambulatory Visit
Admission: RE | Admit: 2019-10-20 | Discharge: 2019-10-20 | Disposition: A | Discharge status: Home / Self Care | Payer: 59 | Source: Ambulatory Visit | Attending: Neurology | Admitting: Neurology

## 2019-10-20 ENCOUNTER — Other Ambulatory Visit: Payer: Self-pay

## 2019-10-20 DIAGNOSIS — G35 Multiple sclerosis: Secondary | ICD-10-CM

## 2019-10-20 DIAGNOSIS — M4802 Spinal stenosis, cervical region: Secondary | ICD-10-CM

## 2019-10-20 MED ORDER — GADOBENATE DIMEGLUMINE 529 MG/ML IV SOLN
13.0000 mL | Freq: Once | INTRAVENOUS | Status: AC | PRN
  Administered 2019-10-20: 13 mL via INTRAVENOUS

## 2019-11-12 ENCOUNTER — Telehealth: Payer: Self-pay

## 2019-11-12 NOTE — Telephone Encounter (Signed)
Message left by [facility]. Please give [place] call back in regards to the Pt Ocrevus Order.  Please Clarify if the pt is supposed to get 600 mg  dose every 6 months.   Tried calling rep back, unable to reach rep at this time will try again later.

## 2019-11-22 ENCOUNTER — Other Ambulatory Visit: Payer: Self-pay | Admitting: Neurology

## 2019-12-26 ENCOUNTER — Ambulatory Visit: Payer: 59 | Admitting: Neurology

## 2020-01-09 ENCOUNTER — Other Ambulatory Visit: Payer: Self-pay | Admitting: Neurosurgery

## 2020-01-29 ENCOUNTER — Other Ambulatory Visit: Payer: Self-pay

## 2020-01-29 ENCOUNTER — Encounter
Admission: RE | Admit: 2020-01-29 | Discharge: 2020-01-29 | Disposition: A | Discharge status: Home / Self Care | Payer: 59 | Source: Ambulatory Visit | Attending: Neurosurgery | Admitting: Neurosurgery

## 2020-01-29 DIAGNOSIS — Z01818 Encounter for other preprocedural examination: Secondary | ICD-10-CM | POA: Diagnosis present

## 2020-01-29 LAB — BASIC METABOLIC PANEL
Anion gap: 9 (ref 5–15)
BUN: 11 mg/dL (ref 6–20)
CO2: 25 mmol/L (ref 22–32)
Calcium: 9.6 mg/dL (ref 8.9–10.3)
Chloride: 102 mmol/L (ref 98–111)
Creatinine, Ser: 0.84 mg/dL (ref 0.61–1.24)
GFR, Estimated: >60 mL/min (ref >60)
Glucose, Bld: 109 mg/dL — ABNORMAL HIGH (ref 70–99)
Potassium: 3.7 mmol/L (ref 3.5–5.1)
Sodium: 136 mmol/L (ref 135–145)

## 2020-01-29 LAB — TYPE AND SCREEN
ABO/RH(D): B POS
Antibody Screen: NEGATIVE

## 2020-01-29 LAB — URINALYSIS, ROUTINE W REFLEX MICROSCOPIC
Bilirubin Urine: NEGATIVE
Glucose, UA: NEGATIVE mg/dL
Hgb urine dipstick: NEGATIVE
Ketones, ur: NEGATIVE mg/dL
Leukocytes,Ua: NEGATIVE
Nitrite: NEGATIVE
Protein, ur: NEGATIVE mg/dL
Specific Gravity, Urine: 1.002 — ABNORMAL LOW (ref 1.005–1.030)
pH: 7 (ref 5.0–8.0)

## 2020-01-29 LAB — CBC
HCT: 37.9 % — ABNORMAL LOW (ref 39.0–52.0)
Hemoglobin: 13.1 g/dL (ref 13.0–17.0)
MCH: 30.6 pg (ref 26.0–34.0)
MCHC: 34.6 g/dL (ref 30.0–36.0)
MCV: 88.6 fL (ref 80.0–100.0)
Platelets: 247 10*3/uL (ref 150–400)
RBC: 4.28 MIL/uL (ref 4.22–5.81)
RDW: 12.6 % (ref 11.5–15.5)
WBC: 10.5 10*3/uL (ref 4.0–10.5)
nRBC: 0 % (ref 0.0–0.2)

## 2020-01-29 LAB — PROTIME-INR
INR: 1 (ref 0.8–1.2)
Prothrombin Time: 12.5 seconds (ref 11.4–15.2)

## 2020-01-29 LAB — SURGICAL PCR SCREEN
MRSA, PCR: NEGATIVE
Staphylococcus aureus: POSITIVE — AB

## 2020-01-29 LAB — APTT: aPTT: 28 seconds (ref 24–36)

## 2020-01-29 NOTE — Pre-Procedure Instructions (Signed)
Patient unable to sit in a chair for any length of time. During his P.A.T. visit, he had to stand and move. Touching a chair or bed increases the pain and electrical current through his body.

## 2020-01-29 NOTE — Patient Instructions (Signed)
INSTRUCTIONS FOR SURGERY     Your surgery is scheduled for:   Wednesday, January 5TH     To find out your arrival time for the day of surgery,          please call [phone] between 1 pm and 3 pm on : Tuesday, January 4TH     When you arrive for surgery, report to the MEDICAL MALL. They will sign you in and send you         To the second floor surgery registration desk.          REMEMBER: Instructions that are not followed completely may result in serious medical risk,  up to and including death, or upon the discretion of your surgeon and anesthesiologist,            your surgery may need to be rescheduled.  __X__ 1. Do not eat food after midnight the night before your procedure.                    No gum, candy, lozenger, tic tacs, tums or hard candies.                  ABSOLUTELY NOTHING SOLID IN YOUR MOUTH AFTER MIDNIGHT                    You may drink unlimited clear liquids up to 2 hours before you are scheduled to arrive for surgery.                   Do not drink anything within those 2 hours unless you need to take medicine, then take the                   smallest amount you need.  Clear liquids include:  water, apple juice without pulp,                   any flavor Gatorade, Black coffee, black tea.  Sugar may be added but no dairy/ honey /lemon.                        Broth and jello is not considered a clear liquid.  __x__  2. On the morning of surgery, please brush your teeth with toothpaste and water. You may rinse with                  mouthwash if you wish but DO NOT SWALLOW TOOTHPASTE OR MOUTHWASH  __X___3. NO alcohol for 24 hours before or after surgery.  __x___ 4.  Do NOT smoke or use e-cigarettes for 24 HOURS PRIOR TO SURGERY.                      DO NOT Use any chewable tobacco products for at least 6 hours prior to surgery.  __x___ 5. If you start any new medication after this appointment and prior to  surgery, please                   Bring it with you on the day of surgery.  ___x__ 6.  Notify your doctor if there is any change in your medical condition, such as fever, infection, vomitting,                   Diarrhea or any open sores.  __x___ 7.  USE the CHG SOAP as instructed, the night before surgery and the day of surgery.                   Once you have washed with this soap, do NOT use any of the following: Powders, perfumes                    or lotions. Please do not wear make up, hairpins, clips or nail polish. You MAY wear deodorant.                   Men may shave their face and neck.  Women need to shave 48 hours prior to surgery.                   DO NOT wear ANY jewelry on the day of surgery. If there are rings that are too tight to                    remove easily, please address this prior to the surgery day. Piercings need to be removed.                                                                     NO METAL ON YOUR BODY.                    Do NOT bring any valuables.  If you came to Pre-Admit testing then you will not need license,                     insurance card or credit card.  If you will be staying overnight, please either leave your things in                     the car or have your family be responsible for these items.                     [facility] IS NOT RESPONSIBLE FOR BELONGINGS OR VALUABLES.  ___X__ 8. DO NOT wear contact lenses on surgery day.  You may not have dentures,                     Hearing aides, contacts or glasses in the operating room. These items can be                    Placed in the Recovery Room to receive immediately after surgery.  __x___ 9. IF YOU ARE SCHEDULED TO GO HOME ON THE SAME DAY, YOU MUST                   Have someone to drive you home and to stay with you  for the first 24 hours.                    Have an arrangement prior to arriving on surgery  day.  ___x__ 10. Take the following medications on the morning of surgery  with a sip of water:                              1. PREDNISONE                     2.  _____ 11.  Follow any instructions provided to you by your surgeon.                        Such as enema, clear liquid bowel prep  __X__  12. STOP  ALL ASPIRIN PRODUCTS AS OF TODAY, December 28TH.                       THIS INCLUDES BC POWDERS / GOODIES POWDER  __x___ 13. STOP Anti-inflammatories as OF TODAY, December 28TH                      This includes IBUPROFEN / MOTRIN / ADVIL / ALEVE/ NAPROXYN                    YOU MAY TAKE TYLENOL ANY TIME PRIOR TO SURGERY.  __X___ 14.  Stop supplements until after surgery.                     This includes: FOLIC ACID // MULTIVITAMINS                 You may continue taking Vitamin B12 / Vitamin D3 but do not take on the morning of surgery.  __X____17.  Continue to take the following medications but do not take on the morning of surgery:                          PROVIGIL // FLEXERIL // VITAMIN D  ______18. If staying overnight, please have appropriate shoes to wear to be able to walk around the unit.                   Wear clean and comfortable clothing to the hospital.  CONTINUE TAKING YOUR EVENING MEDICATIONS AS USUAL.

## 2020-02-04 ENCOUNTER — Other Ambulatory Visit
Admission: RE | Admit: 2020-02-04 | Discharge: 2020-02-04 | Disposition: A | Discharge status: Home / Self Care | Payer: 59 | Source: Ambulatory Visit | Attending: Neurosurgery | Admitting: Neurosurgery

## 2020-02-04 DIAGNOSIS — U071 COVID-19: Secondary | ICD-10-CM | POA: Diagnosis not present

## 2020-02-04 DIAGNOSIS — Z01818 Encounter for other preprocedural examination: Secondary | ICD-10-CM | POA: Insufficient documentation

## 2020-02-05 LAB — SARS CORONAVIRUS 2 (TAT 6-24 HRS): SARS Coronavirus 2: POSITIVE — AB

## 2020-02-20 ENCOUNTER — Ambulatory Visit
Admission: RE | Admit: 2020-02-20 | Discharge: 2020-02-20 | Disposition: A | Discharge status: Home / Self Care | Payer: 59 | Attending: Neurosurgery | Admitting: Neurosurgery

## 2020-02-20 ENCOUNTER — Encounter: Payer: Self-pay | Admitting: Neurosurgery

## 2020-02-20 ENCOUNTER — Other Ambulatory Visit: Payer: Self-pay

## 2020-02-20 ENCOUNTER — Encounter
Admission: RE | Disposition: A | Discharge status: Home / Self Care | Payer: Self-pay | Source: Home / Self Care | Attending: Neurosurgery

## 2020-02-20 ENCOUNTER — Ambulatory Visit: Payer: 59

## 2020-02-20 ENCOUNTER — Ambulatory Visit: Payer: 59 | Admitting: Anesthesiology

## 2020-02-20 DIAGNOSIS — M4802 Spinal stenosis, cervical region: Secondary | ICD-10-CM | POA: Diagnosis not present

## 2020-02-20 DIAGNOSIS — Z8249 Family history of ischemic heart disease and other diseases of the circulatory system: Secondary | ICD-10-CM | POA: Insufficient documentation

## 2020-02-20 DIAGNOSIS — Z419 Encounter for procedure for purposes other than remedying health state, unspecified: Secondary | ICD-10-CM

## 2020-02-20 DIAGNOSIS — Z87891 Personal history of nicotine dependence: Secondary | ICD-10-CM | POA: Diagnosis not present

## 2020-02-20 DIAGNOSIS — Z801 Family history of malignant neoplasm of trachea, bronchus and lung: Secondary | ICD-10-CM | POA: Diagnosis not present

## 2020-02-20 DIAGNOSIS — J439 Emphysema, unspecified: Secondary | ICD-10-CM | POA: Diagnosis not present

## 2020-02-20 DIAGNOSIS — G959 Disease of spinal cord, unspecified: Secondary | ICD-10-CM | POA: Diagnosis present

## 2020-02-20 DIAGNOSIS — G35 Multiple sclerosis: Secondary | ICD-10-CM | POA: Diagnosis not present

## 2020-02-20 DIAGNOSIS — Z880 Allergy status to penicillin: Secondary | ICD-10-CM | POA: Diagnosis not present

## 2020-02-20 DIAGNOSIS — Z981 Arthrodesis status: Secondary | ICD-10-CM | POA: Diagnosis not present

## 2020-02-20 DIAGNOSIS — Z820 Family history of epilepsy and other diseases of the nervous system: Secondary | ICD-10-CM | POA: Insufficient documentation

## 2020-02-20 DIAGNOSIS — Z7952 Long term (current) use of systemic steroids: Secondary | ICD-10-CM | POA: Diagnosis not present

## 2020-02-20 HISTORY — PX: ANTERIOR CERVICAL DECOMP/DISCECTOMY FUSION: SHX1161

## 2020-02-20 LAB — URINE DRUG SCREEN, QUALITATIVE (ARMC ONLY)
Amphetamines, Ur Screen: NOT DETECTED
Barbiturates, Ur Screen: NOT DETECTED
Benzodiazepine, Ur Scrn: NOT DETECTED
Cannabinoid 50 Ng, Ur ~~LOC~~: POSITIVE — AB
Cocaine Metabolite,Ur ~~LOC~~: NOT DETECTED
MDMA (Ecstasy)Ur Screen: NOT DETECTED
Methadone Scn, Ur: NOT DETECTED
Opiate, Ur Screen: NOT DETECTED
Phencyclidine (PCP) Ur S: NOT DETECTED
Tricyclic, Ur Screen: NOT DETECTED

## 2020-02-20 LAB — TYPE AND SCREEN
ABO/RH(D): B POS
Antibody Screen: NEGATIVE

## 2020-02-20 SURGERY — ANTERIOR CERVICAL DECOMPRESSION/DISCECTOMY FUSION 1 LEVEL
Anesthesia: General

## 2020-02-20 MED ORDER — ACETAMINOPHEN 500 MG PO TABS
1000.0000 mg | ORAL_TABLET | Freq: Four times a day (QID) | ORAL | Status: DC | PRN
  Administered 2020-02-20: 1000 mg via ORAL

## 2020-02-20 MED ORDER — GLYCOPYRROLATE 0.2 MG/ML IJ SOLN
INTRAMUSCULAR | Status: DC | PRN
  Administered 2020-02-20: .2 mg via INTRAVENOUS

## 2020-02-20 MED ORDER — PROPOFOL 500 MG/50ML IV EMUL
INTRAVENOUS | Status: DC | PRN
  Administered 2020-02-20: 100 ug/kg/min via INTRAVENOUS

## 2020-02-20 MED ORDER — OXYCODONE HCL 5 MG PO TABS
ORAL_TABLET | ORAL | Status: AC
  Administered 2020-02-20: 5 mg via ORAL
  Filled 2020-02-20: qty 1

## 2020-02-20 MED ORDER — SODIUM CHLORIDE 0.9 % IV SOLN
INTRAVENOUS | Status: DC | PRN
  Administered 2020-02-20: 50 ug/min via INTRAVENOUS

## 2020-02-20 MED ORDER — FENTANYL CITRATE (PF) 100 MCG/2ML IJ SOLN
INTRAMUSCULAR | Status: AC
  Filled 2020-02-20: qty 2

## 2020-02-20 MED ORDER — REMIFENTANIL HCL 1 MG IV SOLR
INTRAVENOUS | Status: DC | PRN
  Administered 2020-02-20: .1 ug/kg/min via INTRAVENOUS

## 2020-02-20 MED ORDER — BUPIVACAINE-EPINEPHRINE (PF) 0.5% -1:200000 IJ SOLN
INTRAMUSCULAR | Status: DC | PRN
  Administered 2020-02-20: 5 mL

## 2020-02-20 MED ORDER — LIDOCAINE HCL (CARDIAC) PF 100 MG/5ML IV SOSY
PREFILLED_SYRINGE | INTRAVENOUS | Status: DC | PRN
  Administered 2020-02-20: 100 mg via INTRAVENOUS

## 2020-02-20 MED ORDER — THROMBIN 5000 UNITS EX SOLR
CUTANEOUS | Status: DC | PRN
  Administered 2020-02-20: 5000 [IU] via TOPICAL

## 2020-02-20 MED ORDER — FENTANYL CITRATE (PF) 100 MCG/2ML IJ SOLN
INTRAMUSCULAR | Status: DC | PRN
  Administered 2020-02-20 (×2): 50 ug via INTRAVENOUS

## 2020-02-20 MED ORDER — CHLORHEXIDINE GLUCONATE 0.12 % MT SOLN: 15.0000 mL | Freq: Once | OROMUCOSAL | Status: AC

## 2020-02-20 MED ORDER — OXYCODONE HCL 5 MG PO TABS
5.0000 mg | ORAL_TABLET | Freq: Once | ORAL | Status: AC
  Administered 2020-02-20: 5 mg via ORAL

## 2020-02-20 MED ORDER — METHOCARBAMOL 500 MG PO TABS: 500.0000 mg | ORAL_TABLET | Freq: Once | ORAL | Status: DC

## 2020-02-20 MED ORDER — CEFAZOLIN SODIUM-DEXTROSE 2-4 GM/100ML-% IV SOLN
2.0000 g | Freq: Once | INTRAVENOUS | Status: AC
  Administered 2020-02-20: 2 g via INTRAVENOUS

## 2020-02-20 MED ORDER — ROCURONIUM BROMIDE 100 MG/10ML IV SOLN
INTRAVENOUS | Status: DC | PRN
  Administered 2020-02-20: 10 mg via INTRAVENOUS

## 2020-02-20 MED ORDER — CEFAZOLIN SODIUM-DEXTROSE 2-4 GM/100ML-% IV SOLN
INTRAVENOUS | Status: AC
  Filled 2020-02-20: qty 100

## 2020-02-20 MED ORDER — CHLORHEXIDINE GLUCONATE 0.12 % MT SOLN
OROMUCOSAL | Status: AC
  Administered 2020-02-20: 15 mL via OROMUCOSAL
  Filled 2020-02-20: qty 15

## 2020-02-20 MED ORDER — METHOCARBAMOL 500 MG PO TABS
ORAL_TABLET | ORAL | Status: AC
  Administered 2020-02-20: 500 mg via ORAL
  Filled 2020-02-20: qty 1

## 2020-02-20 MED ORDER — METHOCARBAMOL 1000 MG/10ML IJ SOLN: 500.0000 mg | Freq: Once | INTRAVENOUS | Status: DC

## 2020-02-20 MED ORDER — ACETAMINOPHEN 500 MG PO TABS
ORAL_TABLET | ORAL | Status: AC
  Filled 2020-02-20: qty 2

## 2020-02-20 MED ORDER — ACETAMINOPHEN 500 MG PO TABS: 1000.0000 mg | ORAL_TABLET | Freq: Four times a day (QID) | ORAL | 0 refills | Status: DC | PRN

## 2020-02-20 MED ORDER — SUCCINYLCHOLINE CHLORIDE 20 MG/ML IJ SOLN
INTRAMUSCULAR | Status: DC | PRN
  Administered 2020-02-20: 100 mg via INTRAVENOUS

## 2020-02-20 MED ORDER — ONDANSETRON HCL 4 MG/2ML IJ SOLN
INTRAMUSCULAR | Status: DC | PRN
  Administered 2020-02-20: 4 mg via INTRAVENOUS

## 2020-02-20 MED ORDER — LACTATED RINGERS IV SOLN: INTRAVENOUS | Status: DC

## 2020-02-20 MED ORDER — METHOCARBAMOL 1000 MG/10ML IJ SOLN: 750.0000 mg | Freq: Once | INTRAVENOUS | Status: DC

## 2020-02-20 MED ORDER — PROPOFOL 10 MG/ML IV BOLUS
INTRAVENOUS | Status: DC | PRN
  Administered 2020-02-20: 130 mg via INTRAVENOUS

## 2020-02-20 MED ORDER — DEXAMETHASONE SODIUM PHOSPHATE 10 MG/ML IJ SOLN
INTRAMUSCULAR | Status: DC | PRN
  Administered 2020-02-20: 10 mg via INTRAVENOUS

## 2020-02-20 MED ORDER — PROMETHAZINE HCL 25 MG/ML IJ SOLN: 6.2500 mg | INTRAMUSCULAR | Status: DC | PRN

## 2020-02-20 MED ORDER — FENTANYL CITRATE (PF) 100 MCG/2ML IJ SOLN
25.0000 ug | INTRAMUSCULAR | Status: DC | PRN
  Administered 2020-02-20 (×3): 50 ug via INTRAVENOUS

## 2020-02-20 MED ORDER — OXYCODONE HCL 5 MG PO TABS: 5.0000 mg | ORAL_TABLET | ORAL | Status: DC | PRN

## 2020-02-20 MED ORDER — MIDAZOLAM HCL 2 MG/2ML IJ SOLN
INTRAMUSCULAR | Status: DC | PRN
  Administered 2020-02-20 (×2): 2 mg via INTRAVENOUS

## 2020-02-20 MED ORDER — FAMOTIDINE 20 MG PO TABS
ORAL_TABLET | ORAL | Status: AC
  Administered 2020-02-20: 20 mg via ORAL
  Filled 2020-02-20: qty 1

## 2020-02-20 MED ORDER — FAMOTIDINE 20 MG PO TABS: 20.0000 mg | ORAL_TABLET | Freq: Once | ORAL | Status: AC

## 2020-02-20 MED ORDER — OXYCODONE HCL 5 MG PO TABS: 5.0000 mg | ORAL_TABLET | ORAL | 0 refills | Status: AC | PRN

## 2020-02-20 MED ORDER — ORAL CARE MOUTH RINSE: 15.0000 mL | Freq: Once | OROMUCOSAL | Status: AC

## 2020-02-20 SURGICAL SUPPLY — 62 items
ADH SKN CLS APL DERMABOND .7 (GAUZE/BANDAGES/DRESSINGS) ×1
AGENT HMST MTR 8 SURGIFLO (HEMOSTASIS) ×1
APL PRP STRL LF DISP 70% ISPRP (MISCELLANEOUS) ×2
BASKET BONE COLLECTION (BASKET) IMPLANT
BULB RESERV EVAC DRAIN JP 100C (MISCELLANEOUS) IMPLANT
BUR NEURO DRILL SOFT 3.0X3.8M (BURR) ×2 IMPLANT
CANISTER SUCT 1200ML W/VALVE (MISCELLANEOUS) IMPLANT
CHLORAPREP W/TINT 26 (MISCELLANEOUS) ×4 IMPLANT
COUNTER NEEDLE 20/40 LG (NEEDLE) ×2 IMPLANT
COVER WAND RF STERILE (DRAPES) ×2 IMPLANT
CUP MEDICINE 2OZ PLAST GRAD ST (MISCELLANEOUS) ×2 IMPLANT
DERMABOND ADVANCED (GAUZE/BANDAGES/DRESSINGS) ×1
DERMABOND ADVANCED .7 DNX12 (GAUZE/BANDAGES/DRESSINGS) ×1 IMPLANT
DRAIN CHANNEL JP 10F RND 20C F (MISCELLANEOUS) IMPLANT
DRAPE C ARM PK CFD 31 SPINE (DRAPES) ×2 IMPLANT
DRAPE LAPAROTOMY 77X122 PED (DRAPES) ×2 IMPLANT
DRAPE MICROSCOPE SPINE 48X150 (DRAPES) ×2 IMPLANT
DRAPE POUCH INSTRU U-SHP 10X18 (DRAPES) IMPLANT
DRAPE SURG 17X11 SM STRL (DRAPES) ×8 IMPLANT
ELECT CAUTERY BLADE TIP 2.5 (TIP) ×2
ELECT REM PT RETURN 9FT ADLT (ELECTROSURGICAL) ×2
ELECTRODE CAUTERY BLDE TIP 2.5 (TIP) ×1 IMPLANT
ELECTRODE REM PT RTRN 9FT ADLT (ELECTROSURGICAL) ×1 IMPLANT
FEE INTRAOP MONITOR IMPULS NCS (MISCELLANEOUS) ×1 IMPLANT
GLOVE SURG SYN 7.0 (GLOVE) ×4 IMPLANT
GLOVE SURG SYN 8.5  E (GLOVE) ×3
GLOVE SURG SYN 8.5 E (GLOVE) ×3 IMPLANT
GLOVE SURG UNDER POLY LF SZ7 (GLOVE) ×2 IMPLANT
GOWN SRG XL LVL 3 NONREINFORCE (GOWNS) ×1 IMPLANT
GOWN STRL NON-REIN TWL XL LVL3 (GOWNS) ×2
GOWN STRL REUS W/ TWL XL LVL3 (GOWN DISPOSABLE) ×1 IMPLANT
GOWN STRL REUS W/TWL XL LVL3 (GOWN DISPOSABLE) ×2
GRADUATE 1200CC STRL 31836 (MISCELLANEOUS) ×2 IMPLANT
INTRAOP MONITOR FEE IMPULS NCS (MISCELLANEOUS) ×1
INTRAOP MONITOR FEE IMPULSE (MISCELLANEOUS) ×1
KIT TURNOVER KIT A (KITS) ×2 IMPLANT
MANIFOLD NEPTUNE II (INSTRUMENTS) ×2 IMPLANT
MARKER SKIN DUAL TIP RULER LAB (MISCELLANEOUS) ×2 IMPLANT
NDL SAFETY ECLIPSE 18X1.5 (NEEDLE) ×1 IMPLANT
NEEDLE HYPO 18GX1.5 SHARP (NEEDLE) ×2
NEEDLE HYPO 22GX1.5 SAFETY (NEEDLE) ×2 IMPLANT
NS IRRIG 1000ML POUR BTL (IV SOLUTION) ×2 IMPLANT
PACK LAMINECTOMY NEURO (CUSTOM PROCEDURE TRAY) ×2 IMPLANT
PAD ARMBOARD 7.5X6 YLW CONV (MISCELLANEOUS) ×2 IMPLANT
PIN CASPAR 14 (PIN) ×1 IMPLANT
PIN CASPAR 14MM (PIN) ×2
PLATE ANT CERV XTEND 1 LV 12 (Plate) ×2 IMPLANT
PUTTY DBX 1CC (Putty) ×2 IMPLANT
PUTTY DBX 1CC DEPUY (Putty) ×1 IMPLANT
SCREW VAR 4.2 XD SELF DRILL 16 (Screw) ×8 IMPLANT
SPACER C HEDRON 12X14 7M 7D (Spacer) ×2 IMPLANT
SPOGE SURGIFLO 8M (HEMOSTASIS) ×1
SPONGE KITTNER 5P (MISCELLANEOUS) ×2 IMPLANT
SPONGE SURGIFLO 8M (HEMOSTASIS) ×1 IMPLANT
STAPLER SKIN PROX 35W (STAPLE) IMPLANT
SUT V-LOC 90 ABS DVC 3-0 CL (SUTURE) ×2 IMPLANT
SUT VIC AB 3-0 SH 8-18 (SUTURE) ×2 IMPLANT
SYR 30ML LL (SYRINGE) ×2 IMPLANT
TAPE CLOTH 3X10 WHT NS LF (GAUZE/BANDAGES/DRESSINGS) ×2 IMPLANT
TOWEL OR 17X26 4PK STRL BLUE (TOWEL DISPOSABLE) ×6 IMPLANT
TRAY FOLEY MTR SLVR 16FR STAT (SET/KITS/TRAYS/PACK) IMPLANT
TUBING CONNECTING 10 (TUBING) ×2 IMPLANT

## 2020-02-20 NOTE — Anesthesia Postprocedure Evaluation (Signed)
Anesthesia Post Note  Patient: HIEN PERREIRA  Procedure(s) Performed: ANTERIOR CERVICAL DECOMPRESSION/DISCECTOMY FUSION 1 LEVEL C3-4 (N/A )  Patient location during evaluation: PACU Anesthesia Type: General Level of consciousness: awake and alert Pain management: pain level controlled Vital Signs Assessment: post-procedure vital signs reviewed and stable Respiratory status: spontaneous breathing, nonlabored ventilation, respiratory function stable and patient connected to nasal cannula oxygen Cardiovascular status: blood pressure returned to baseline and stable Postop Assessment: no apparent nausea or vomiting Anesthetic complications: no   No complications documented.   Last Vitals:  Vitals:   02/20/20 1353 02/20/20 1440  BP: 105/67 104/72  Pulse: (!) 52 (!) 52  Resp: 16 16  Temp:  36.7 C  SpO2: 100% 100%    Last Pain:  Vitals:   02/20/20 1440  TempSrc: Temporal  PainSc: 5                  Cleda Mccreedy Ryosuke Ericksen

## 2020-02-20 NOTE — Discharge Summary (Signed)
Procedure: ACDF C3-4 Procedure date: 02/20/2020 Diagnosis: Cervical myelopathy    History: Joshua [name] is s/p ACDF C3-4 POD0: Tolerated procedure well. Evaluated in post op recovery still disoriented from anesthesia but able to answer questions and obey commands.   Physical Exam: Vitals:   02/20/20 1115 02/20/20 1122  BP:    Pulse: 79 94  Resp: 15 12  Temp: (!) 97 F (36.1 C)   SpO2: 98% 100%    General: Disoriented from anesthesia but alert, sitting in bed Strength:5/5 throughout  Sensation: UTA given disorientation  Skin: incision clean, dry, intact, neck flat  Data:  No results for input(s): NA, K, CL, CO2, BUN, CREATININE, LABGLOM, GLUCOSE, CALCIUM in the last 168 hours. No results for input(s): AST, ALT, ALKPHOS in the last 168 hours.  Invalid input(s): TBILI   No results for input(s): WBC, HGB, HCT, PLT in the last 168 hours. No results for input(s): APTT, INR in the last 168 hours.       Assessment/Plan:  Joshua [name] is POD0 s/p ACDF C3-4.  He should remain in the recovery area for at least 4 hours postoperatively. Once he is able to ambulate, urinate, tolerate some PO, he is cleared to discharge home.  He will follow up with [place] in clinic postoperatively.   Patsey Berthold, NP Department of Neurosurgery

## 2020-02-20 NOTE — Anesthesia Preprocedure Evaluation (Signed)
Anesthesia Evaluation  Patient identified by MRN, date of birth, ID band Patient awake    Reviewed: Allergy & Precautions, H&P , NPO status , Patient's Chart, lab work & pertinent test results, reviewed documented beta blocker date and time   History of Anesthesia Complications Negative for: history of anesthetic complications  Airway Mallampati: III  TM Distance: >3 FB Neck ROM: full    Dental  (+) Upper Dentures, Lower Dentures, Dental Advidsory Given, Edentulous Upper, Edentulous Lower   Pulmonary neg shortness of breath, neg sleep apnea, neg COPD, neg recent URI, Current Smoker and Patient abstained from smoking.,    Pulmonary exam normal breath sounds clear to auscultation       Cardiovascular Exercise Tolerance: Good negative cardio ROS Normal cardiovascular exam Rhythm:regular Rate:Normal     Neuro/Psych PSYCHIATRIC DISORDERS Anxiety Depression negative neurological ROS     GI/Hepatic negative GI ROS, Neg liver ROS,   Endo/Other  negative endocrine ROS  Renal/GU negative Renal ROS  negative genitourinary   Musculoskeletal   Abdominal   Peds  Hematology negative hematology ROS (+)   Anesthesia Other Findings Past Medical History: No date: Allergy No date: Anxiety No date: Depression No date: Multiple sclerosis (HCC) No date: Neck pain No date: Neuromuscular disorder (HCC)   Reproductive/Obstetrics negative OB ROS                             Anesthesia Physical Anesthesia Plan  ASA: II  Anesthesia Plan: General   Post-op Pain Management:    Induction: Intravenous  PONV Risk Score and Plan: 1 and Ondansetron, Dexamethasone, Midazolam and Treatment may vary due to age or medical condition  Airway Management Planned: Oral ETT  Additional Equipment:   Intra-op Plan:   Post-operative Plan: Extubation in OR  Informed Consent: I have reviewed the patients History and  Physical, chart, labs and discussed the procedure including the risks, benefits and alternatives for the proposed anesthesia with the patient or authorized representative who has indicated his/her understanding and acceptance.     Dental Advisory Given  Plan Discussed with: Anesthesiologist, CRNA and Surgeon  Anesthesia Plan Comments:         Anesthesia Quick Evaluation

## 2020-02-20 NOTE — H&P (Signed)
History of Present Illness: 02/20/2020 Joshua [name] presents today with ongoing symptoms.  He has failed conservative management and elected to proceed with surgery.  11/01/2019  Joshua [name] returns to see me. He continues to have severe neck pain. He also continues to have some symptoms of myelopathy as noted below. He has tried physical therapy but it did not help at all.  09/20/2019 Joshua [name] is here today with a chief complaint of neck pain that radiates bilaterally down into his fingers. He is also having tingling and numbness from his upper arm to all 5 fingers. It never really stops. He also describes pain into his trapezius muscles.   He had surgery in 2014, and has had neck pain since that time. He reports worsening pain over the last year. He is also had issues with his balance, but was also diagnosed with multiple sclerosis which has impacted his ability to walk. He does have a lesion in his upper cervical spinal cord.  His neck pain is now impacting his ability to do his job, as he has trouble rotating his neck and looking up and down. He reports aching, burning, stabbing, and tingling pain that is made worse by activity. Prednisone helps it. He denies weakness. He does report some imbalance.  Conservative measures:  Physical therapy: has participated in, but years ago.  Multimodal medical therapy including regular antiinflammatories: tylenol, ibuprofen, topical lidocaine, flexeril, lyrica, prednisone Injections: has had epidural steroid injections 09/05/19: C6-7 ESI (Dr. Cherylann Ratel) helped for 2-3 days 07/18/19: C6-7 ESI(Dr. Cherylann Ratel) helped for 2-3 days  Past Surgery: 2014 C4-C7 cervical fusion by Dr. Otis Peak Tia Masker has no symptoms of cervical myelopathy.  The symptoms are causing a significant impact on the patient's life.   Review of Systems:  A 10 point review of systems is negative, except for the pertinent positives and negatives detailed in the  HPI.  Past Medical History: Past Medical History:  Diagnosis Date  . Chickenpox  . Generalized pain  . Migraine  . MS (multiple sclerosis) (CMS-HCC)  . Subdural hematoma, acute (CMS-HCC)   Past Surgical History: Past Surgical History:  Procedure Laterality Date  . CRANIOPLASTY FOR REPAIR SKULL DEFECT W/REPARATIVE BRAIN SURGERY   Allergies  Allergen Reactions  . Penicillins Hives    As a child     Current Meds  Medication Sig  . cholecalciferol (VITAMIN D3) 25 MCG (1000 UNIT) tablet Take 1,000 Units by mouth daily.  . cyclobenzaprine (FLEXERIL) 10 MG tablet Take 10 mg by mouth 3 (three) times daily as needed for muscle spasms.  . folic acid (FOLVITE) 1 MG tablet Take 1 mg by mouth daily.  . modafinil (PROVIGIL) 100 MG tablet TAKE 1 TABLET BY MOUTH EVERY DAY (Patient taking differently: Take 100 mg by mouth daily.)  . Multiple Vitamins-Minerals (MULTIVITAMIN WITH MINERALS) tablet Take 1 tablet by mouth daily.  . nicotine (NICODERM CQ - DOSED IN MG/24 HOURS) 14 mg/24hr patch Place 14 mg onto the skin daily.  . nortriptyline (PAMELOR) 10 MG capsule Take 1 capsule at bedtime for a week, then take 2 capsules at bedtime (Patient taking differently: Take 20 mg by mouth at bedtime. take 2 capsules at bedtime)  . predniSONE (DELTASONE) 50 MG tablet Take 50 mg by mouth daily with breakfast.  . sildenafil (VIAGRA) 100 MG tablet Take 100 mg by mouth daily as needed for erectile dysfunction.    Social History: Social History   Tobacco Use  . Smoking status: Former Smoker  Packs/day: 1.00  . Smokeless tobacco: Never Used  Vaping Use  . Vaping Use: Never used  Substance Use Topics  . Alcohol use: No  Alcohol/week: 0.0 standard drinks  . Drug use: Yes  Comment: cocaine in the past   Family Medical History: Family History  Problem Relation Age of Onset  . High blood pressure (Hypertension) Mother  . Myocardial Infarction (Heart attack) Mother  . Lung cancer Father  . Multiple  sclerosis Sister  . Brain hemorrhage Brother  . Aneurysm Brother   Physical Examination:  Vitals:   02/20/20 0729  BP: 97/74  Pulse: 69  Resp: 16  Temp: 97.8 F (36.6 C)  SpO2: 100%   Heart sounds normal no MRG. Chest Clear to Auscultation Bilaterally.   General: Patient is well developed, well nourished, calm, collected, and in no apparent distress. Attention to examination is appropriate.  Psychiatric: Patient is non-anxious.  Head: Pupils equal, round, and reactive to light.  ENT: Oral mucosa appears well hydrated.  Neck: Supple. Full range of motion.  Respiratory: Patient is breathing without any difficulty.  Extremities: No edema.  Vascular: Palpable dorsal pedal pulses.  Skin: On exposed skin, there are no abnormal skin lesions.  NEUROLOGICAL:   Awake, alert, oriented to person, place, and time. Speech is clear and fluent. Fund of knowledge is appropriate.   Cranial Nerves: Pupils equal round and reactive to light. Facial tone is symmetric. Facial sensation is symmetric. Shoulder shrug is symmetric. Tongue protrusion is midline. There is no pronator drift.  ROM of spine: diminished in all directions.  Strength: Side Biceps Triceps Deltoid Interossei Grip Wrist Ext. Wrist Flex.  R 5 5 5 5 5 5 5   L 5 5 5 5 5 5 5    Side Iliopsoas Quads Hamstring PF DF EHL  R 5 5 5 5 5 5   L 5 5 5 5 5 5    Reflexes are 1+ and symmetric at the biceps, triceps, brachioradialis, 2+ patella and achilles. Hoffman's is absent. Clonus is not present. Toes are down-going.  Bilateral upper and lower extremity sensation is intact to light touch.  Gait is slightly wide-based. No evidence of dysmetria noted.  Medical Decision Making  Imaging: MRI C spine and Brain 02/13/19 IMPRESSION:  MRI HEAD IMPRESSION:   1. Mild patchy T2/FLAIR signal abnormality involving the  periventricular and juxta cortical supratentorial cerebralwhite  matter, consistent with multiple sclerosis. Overall,  appearance is  mildly progressed relative to 2013. No evidence for active  demyelination.  2. Underlying mildly progressed cerebral atrophy for age.  3. Chronic right maxillary sinusitis.   MRI CERVICAL SPINE IMPRESSION:   1. Patchy multifocal cord signal abnormality involving the cervical  spinal cord as above, consistent with history of multiple sclerosis.  Overall, appearance is minimally progressed relative to 2015. No  evidence for active demyelination.  2. Prior ACDF at C4 through C7 without residual stenosis.  3. 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   Electronically Signed  By: M.D.  On: 02/13/2019 19:43  MRI C spine 10/21/2019 Disc levels:   C3-4: Small central disc protrusion narrowing the ventral thecal sac  is unchanged from the prior study. No spinal canal or neural  foraminal stenosis.   There is no spinal canal stenosis at the fused C5-7 levels.   The other intervertebral disc levels are unremarkable.   IMPRESSION:  1. Unchanged distribution of white matter and spinal cord lesions in  a  pattern consistent with multiple sclerosis.  2. No active demyelination.   Electronically Signed  By: Deatra Robinson M.D.  On: 10/21/2019 00:00  CT C spine 09/26/2019 IMPRESSION:  1. C4-5 and C6-7 ACDF with solid osseous fusion at C4-5 and limited  assessment at C6-7 due to streak artifact.  2. Unchanged moderate spinal stenosis at C3-4 due to a disc  protrusion.  3. Emphysema (ICD10-J43.9).   Electronically Signed  By: Sebastian Ache M.D.  On: 09/26/2019 16:48  I have personally reviewed the images and agree with the above interpretation.  Assessment and Plan: Joshua [name] is a pleasant 50 y.o. male with cervical myelopathy and neck pain as well as multiple sclerosis. He has a prior C4-7 anterior cervical discectomy and fusion. He is fused from C4-7.  His balance and  neck pain may be related to his moderate stenosis at C3-4.  We will proceed with C3-4 ACDF.  Venetia Night MD, [facility] Department of Neurosurgery

## 2020-02-20 NOTE — Transfer of Care (Signed)
Immediate Anesthesia Transfer of Care Note  Patient: NEKHI LIWANAG  Procedure(s) Performed: ANTERIOR CERVICAL DECOMPRESSION/DISCECTOMY FUSION 1 LEVEL C3-4 (N/A )  Patient Location: PACU  Anesthesia Type:General  Level of Consciousness: awake and sedated  Airway & Oxygen Therapy: Patient Spontanous Breathing and Patient connected to face mask oxygen  Post-op Assessment: Report given to RN and Post -op Vital signs reviewed and stable  Post vital signs: Reviewed and stable  Last Vitals:  Vitals Value Taken Time  BP    Temp    Pulse    Resp 20 02/20/20 1102  SpO2    Vitals shown include unvalidated device data.  Last Pain:  Vitals:   02/20/20 0729  TempSrc: Temporal  PainSc: 8       Patients Stated Pain Goal: 3 (02/20/20 0729)  Complications: No complications documented.

## 2020-02-20 NOTE — Op Note (Signed)
Indications: Joshua [name] is suffering from cervical myelopathy. he failed conservative management, and elected to proceed with surgery.  Findings: prior fusion C4-5  Preoperative Diagnosis: Cervical myelopathy G95.9 Postoperative Diagnosis: same   EBL: 25 ml IVF: 1000 ml Drains: none Disposition: Extubated and Stable to PACU Complications: none  No foley catheter was placed.   Preoperative Note:   Risks of surgery discussed include: infection, bleeding, stroke, coma, death, paralysis, CSF leak, nerve/spinal cord injury, numbness, tingling, weakness, complex regional pain syndrome, recurrent stenosis and/or disc herniation, vascular injury, development of instability, neck/back pain, need for further surgery, persistent symptoms, development of deformity, and the risks of anesthesia. The patient understood these risks and agreed to proceed.  Operative Note:   Procedure:  1) Anterior cervical diskectomy and fusion at C3-4 2) Anterior cervical instrumentation at C3-4 using Globus Xtend 3) Insertion of biomechanical device at C3-4 (Hedron C)   Procedure: After obtaining informed consent, the patient taken to the operating room, placed in supine position, general anesthesia induced.  The patient had a small shoulder roll placed behind their shoulders.  The patient received preop antibiotics and IV Decadron.  The patient had a neck incision outlined, was prepped and draped in usual sterile fashion. The incision was injected with local anesthetic.   An incision was opened, dissection taken down medial to the carotid artery and jugular vein, lateral to the trachea and esophagus.  The prevertebral fascia was identified, and a localizing x-ray demonstrated the correct level.  We identified and removed the C4-5 plate. The longus colli were dissected laterally, and self-retaining retractors placed to open the operative field. The microscope was then brought into the field.  With this  complete, distractor pins were placed in the vertebral bodies of C3 and C4. The distractor was placed, and the anulus at C3/4 was opened using a bovie.  Curettes and pituitary rongeurs used to remove the majority of disk, then the drill was used to remove the posterior osteophyte and begin the foraminotomies. The nerve hook was used to elevate the posterior longitudinal ligament, which was then removed with Kerrison rongeurs. Bilateral foraminotomies were performed. The microblunt nerve hook could be passed out the foramen bilaterally.   Meticulous hemostasis was obtained.  A biomechanical device (Globus Hedron C 4 mm height x 11 mm width by 12 mm depth) was placed at C3/4. The device had been filled with allograft for aid in arthrodesis.  The caspar distractor was removed, and bone wax used for hemostasis. A 12 mm Globus Xtend plate was chosen.  Two screws placed in each vertebral body, respectively making sure the screws were behind the locking mechanism.  Final AP and lateral radiographs were taken.   With everything in good position, the wound was irrigated copiously with bacitracin-containing solution and meticulous hemostasis obtained.  Wound was closed in 2 layers using interrupted inverted 3-0 Vicryl sutures.  The wound was dressed with dermabond, the head of bed at 30 degrees, taken to recovery room in stable condition.  No new postop neurological deficits were identified.  Sponge and pattie counts were correct at the end of the procedure.   I performed the entire procedure with the assistance of Patsey Berthold NP as an Designer, television/film set.  Venetia Night MD

## 2020-02-20 NOTE — Discharge Instructions (Signed)
AMBULATORY SURGERY  DISCHARGE INSTRUCTIONS   1) The drugs that you were given will stay in your system until tomorrow so for the next 24 hours you should not:  A) Drive an automobile B) Make any legal decisions C) Drink any alcoholic beverage   2) You may resume regular meals tomorrow.  Today it is better to start with liquids and gradually work up to solid foods.  You may eat anything you prefer, but it is better to start with liquids, then soup and crackers, and gradually work up to solid foods.   3) Please notify your doctor immediately if you have any unusual bleeding, trouble breathing, redness and pain at the surgery site, drainage, fever, or pain not relieved by medication.    4) Additional Instructions:        Please contact your physician with any problems or Same Day Surgery at [phone], Monday through Friday 6 am to 4 pm, or [place] [facility] number at [phone]. Your surgeon has performed an operation on your cervical spine (neck) to relieve pressure on the spinal cord and/or nerves. This involved making an incision in the front of your neck and removing one or more of the discs that support your spine. Next, a small piece of bone, a titanium plate, and screws were used to fuse two or more of the vertebrae (bones) together.  The following are instructions to help in your recovery once you have been discharged from the hospital. Even if you feel well, it is important that you follow these activity guidelines. If you do not let your neck heal properly from the surgery, you can increase the chance of return of your symptoms and other complications.  * Do not take anti-inflammatory medications for 3 months after surgery (naproxen [Aleve], ibuprofen [Advil, Motrin], etc.). These medications can prevent your bones from healing properly.  Activity    No bending, lifting, or twisting ("BLT"). Avoid lifting objects heavier than 10 pounds (gallon milk  jug).  Where possible, avoid household activities that involve lifting, bending, reaching, pushing, or pulling such as laundry, vacuuming, grocery shopping, and childcare. Try to arrange for help from friends and family for these activities while your back heals.  Increase physical activity slowly as tolerated.  Taking short walks is encouraged, but avoid strenuous exercise. Do not jog, run, bicycle, lift weights, or participate in any other exercises unless specifically allowed by your doctor.  Talk to your doctor before resuming sexual activity.  You should not drive until cleared by your doctor.  Until released by your doctor, you should not return to work or school.  You should rest at home and let your body heal.   You may shower three days after your surgery.  After showering, lightly dab your incision dry. Do not take a tub bath or go swimming until approved by your doctor at your follow-up appointment.  If your doctor ordered a cervical collar (neck brace) for you, you should wear it whenever you are out of bed. You may remove it when lying down or sleeping, but you should wear it at all other times. Not all neck surgeries require a cervical collar.  If you smoke, we strongly recommend that you quit.  Smoking has been proven to interfere with normal bone healing and will dramatically reduce the success rate of your surgery. Please contact [facility] (800-QUIT-NOW) and use the resources at www.[facility].com for assistance in stopping smoking.  Surgical Incision   If you have  a dressing on your incision, you may remove it two days after your surgery. Keep your incision area clean and dry.  If you have staples or stitches on your incision, you should have a follow up scheduled for removal. If you do not have staples or stitches, you will have steri-strips (small pieces of surgical tape) or Dermabond glue. The steri-strips/glue should begin to peel away within about a week (it is fine if the  steri-strips fall off before then). If the strips are still in place one week after your surgery, you may gently remove them.  Diet           You may return to your usual diet. However, you may experience discomfort when swallowing in the first month after your surgery. This is normal. You may find that softer foods are more comfortable for you to swallow. Be sure to stay hydrated.  When to Contact us  You may experience pain in your neck and/or pain between your shoulder blades. This is normal and should improve in the next few weeks with the help of pain medication, muscle relaxers, and rest. Some patients report that a warm compress on the back of the neck or between the shoulder blades helps.  However, should you experience any of the following, contact us immediately: . New numbness or weakness . Pain that is progressively getting worse, and is not relieved by your pain medication, muscle relaxers, rest, and warm compresses . Bleeding, redness, swelling, pain, or drainage from surgical incision . Chills or flu-like symptoms . Fever greater than 101.0 F (38.3 C) . Inability to eat, drink fluids, or take medications . Problems with bowel or bladder functions . Difficulty breathing or shortness of breath . Warmth, tenderness, or swelling in your calf Contact Information . During office hours (Monday-Friday 9 am to 5 pm), please call your physician at [phone] and ask for Sharlot Gowda . After hours and weekends, please call [phone] and an answering service will put you in touch with either Dr. Adriana Simas or Dr. Myer Haff.  . For a life-threatening emergency, call 911

## 2020-02-20 NOTE — Anesthesia Procedure Notes (Signed)
Procedure Name: Intubation Date/Time: 02/20/2020 9:38 AM Performed by: Nelda Marseille, CRNA Pre-anesthesia Checklist: Patient identified, Patient being monitored, Timeout performed, Emergency Drugs available and Suction available Patient Re-evaluated:Patient Re-evaluated prior to induction Oxygen Delivery Method: Circle system utilized Preoxygenation: Pre-oxygenation with 100% oxygen Induction Type: IV induction Ventilation: Mask ventilation without difficulty Laryngoscope Size: Mac, 3 and McGraph Grade View: Grade II Tube type: Oral Tube size: 7.5 mm Number of attempts: 1 Airway Equipment and Method: Stylet and Video-laryngoscopy Placement Confirmation: ETT inserted through vocal cords under direct vision,  positive ETCO2 and breath sounds checked- equal and bilateral Secured at: 21 cm Tube secured with: Tape Dental Injury: Teeth and Oropharynx as per pre-operative assessment

## 2020-02-21 ENCOUNTER — Encounter: Payer: Self-pay | Admitting: Neurosurgery

## 2020-03-09 ENCOUNTER — Other Ambulatory Visit: Payer: Self-pay | Admitting: Neurology

## 2020-03-10 ENCOUNTER — Other Ambulatory Visit: Payer: Self-pay

## 2020-03-10 MED ORDER — MODAFINIL 100 MG PO TABS: 100.0000 mg | ORAL_TABLET | Freq: Every day | ORAL | 0 refills | Status: DC

## 2020-03-10 NOTE — Telephone Encounter (Signed)
Patient has an appt 03/25/2020. Last seen August 2021.

## 2020-03-24 NOTE — Progress Notes (Addendum)
NEUROLOGY FOLLOW UP OFFICE NOTE  Joshua [name] [id number]   Subjective:  Joshua [name] is a 50 year old left-handed African American male who follows up for multiple sclerosis.  UPDATE: Current DMT:  Ocrevus  Current medications:  Modafinil 100mg , Flexeril 10mg , nortriptyline 20mg  at bedtime, paroxetine, D3 4000 IU daily, tramadol  09/21/2019 LABS:  Vit D 22.08.  Advised to start D3 4000 IU daily.  MRI of brain and cervical spine with and without contrast was performed on 10/20/2019 which were personally reviewed.  Both brain and cervical spine were stable with no evidence of progression or active disease.  Moderate spinal stenosis at C3-4.  No spinal stenosis at fused C5-7 levels.  Small disc central disc protrusion indenting ventral thecal sac at C2-3 without significant stenosis.    Due to continued balance problems and neck pain, he underwent C3-4 ACDF in January 2022.  He has decreased range of motion and pain since then.    Vision:No issues Motor: No weakness.  He reports that sometimes Joshua [name] legs start shaking. Sensory: Paresthesias involving all extremities. NCV-EMG from 05/27/2014 showed right ulnar neuropathy Pain: Chronic diffuse pain. Chronic neck pain. Chronic back pain.Chronic chest pain.  Referred to pain management, Dr. 10/22/2019.  Underwent C3-4 ACDF.  Lyrica was switched to nortriptyline last visit.  No improvement in pain.   Gait: Unsteady gait. Bowel/Bladder: No issues Fatigue: Daytime fatigue. Poor sleep Cognition: Reports short term memory deficits. 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 Mood: Depressed.  Trouble sleeping. Erectile dysfunction  HISTORY: Initially presented with hand numbness and diffuse pain. MRI of brain and cervical cord at the time revealed numerous periventricular and cervical spinal cord lesions. He did not follow up with neurology and continued to experience diffuse  pain, arm and hand numbness, unsteady gait with falls and "MS hug".  Other pertinent history: 2005 Subdural hematoma: "spontaneous bleed" in setting of cocaine use and high blood pressure s/p surgery on the left skull region.  07/12/2013 ACDF C4-5 and C6-7. Headaches resolved. Still with neck pain. Right ulnar neuropathy on NCV-EMG 05/27/2014. History of alcoholism and drug addiction.  Family History: Sister (diagnosed with MS in her 75s)  Past disease modifying therapy: Tecfidera (reports that it didn't work) Past medications: Cymbalta 30mg  daily; gabapentin, Lyrica  Imaging: 11/13/2009 MRI BRAIN W WO: Multiple periventricular, deep and subcortical white matter lesions, including characteristic perpendicular lesion adjacent to the right ventricle. 11/13/2009 MRI CERVICAL &THORACIC SPINE W WO: T2/STIR hyperintense lesions in the cervical medullary junction,C5, T1-2, T7-8, and T9vertebral levels without evidence of enhancement. Multilevel degenerative changes of the cervical spine. 07/07/2010 MRI CERVICAL SPINE W WO (personally reviewed): Nonenhancing lesions at C1 and C5 levels. 01/04/2012 MRI BRAIN W WO (personally reviewed): Two tiny nonspecific nonenhancing hyperintense foci in subcortical white matter. 05/17/2013 MRI CERVICAL SPINE WO: Multiple nonenhancing lesions within spinal cord, reportedly stable compared to imaging from 07/07/2010. Spondylosis with right paracentral disc herniation without neural compression at C3-4, broad based disc protrusion with bilateral neural foraminal stenosis compressing C7 nerve roots at C6-7, and left-sided facet arthropathy with edema at C7-T1 09/11/2013 MRI CERVICAL SPINE WO: S/p ACDF at C4-5 and C6-7, increased disc herniation and spinal stenosis at C3-4, chronjic left facet arthropathy at C7-T1. 06/07/2014 MRI BRAIN W 05/19/2013 T2 hyperintense lesions within periventricular and subcortical white matter without abnormal enhancement. 06/07/2014  MRI BRAIN W 11/11/2013 T2 hyperintense peripheral cervical spinal cord lesions from the level of the C1 arch  to the T2 level without abnormal enhancement. Status post C4-C7 ACDF. Small disc bulge at C3-C4 resulting in mild spinal canal stenosis. 02/13/2019 MRI BRAIN W WO:  1. Mild patchy T2/FLAIR signal abnormality involving the periventricular and juxta cortical supratentorial cerebral white matter, consistent with multiple sclerosis. Overall, appearance is mildly progressed relative to 2013. No evidence for active demyelination.  2. Underlying mildly progressed cerebral atrophy for age.  3. Chronic right maxillary sinusitis. 02/13/2019 MRI CERVICAL SPINE W WO:  1. Patchy multifocal cord signal abnormality involving the cervical spinal cord as above, consistent with history of multiple sclerosis.  Overall, appearance is minimally progressed relative to 2015. No evidence for active demyelination.  2. Prior ACDF at C4 through C7 without residual stenosis.  3. Adjacent segment disease with central disc protrusion at C3-4 indenting the ventral thecal sac with resultant moderate spinal stenosis and moderate right and mild left C4 foraminal stenosis.  Labs: 04/22/2014 positive JC Virus Ab with index 1.28 Reportedly underwent lumbar puncture for CSF analysis, revealing 10 oligoclonal bands.  PAST MEDICAL HISTORY: Past Medical History:  Diagnosis Date  . Allergy   . Anxiety   . Depression   . Multiple sclerosis (HCC)   . Neck pain   . Neuromuscular disorder [facility])     MEDICATIONS: Current Outpatient Medications on File Prior to Visit  Medication Sig Dispense Refill  . acetaminophen (TYLENOL) 500 MG tablet Take 2 tablets (1,000 mg total) by mouth every 6 (six) hours as needed for mild pain or moderate pain. 30 tablet 0  . cholecalciferol (VITAMIN D3) 25 MCG (1000 UNIT) tablet Take 1,000 Units by mouth daily.    . cyclobenzaprine (FLEXERIL) 10 MG tablet Take 10 mg by mouth 3 (three) times daily as  needed for muscle spasms.    . folic acid (FOLVITE) 1 MG tablet Take 1 mg by mouth daily.    . modafinil (PROVIGIL) 100 MG tablet Take 1 tablet (100 mg total) by mouth daily. 15 tablet 0  . Multiple Vitamins-Minerals (MULTIVITAMIN WITH MINERALS) tablet Take 1 tablet by mouth daily.    . nortriptyline (PAMELOR) 10 MG capsule Take 1 capsule at bedtime for a week, then take 2 capsules at bedtime (Patient taking differently: Take 20 mg by mouth at bedtime. take 2 capsules at bedtime) 60 capsule 0  . predniSONE (DELTASONE) 50 MG tablet Take 50 mg by mouth daily with breakfast.    . sildenafil (VIAGRA) 100 MG tablet Take 100 mg by mouth daily as needed for erectile dysfunction.     No current facility-administered medications on file prior to visit.    ALLERGIES: Allergies  Allergen Reactions  . Penicillins Hives    As a child     FAMILY HISTORY: Family History  Problem Relation Age of Onset  . Hypertension Mother   . Hypertension Father   . Multiple sclerosis Sister   . Aneurysm Brother     SOCIAL HISTORY: Social History   Socioeconomic History  . Marital status: Married    Spouse name: angelina  . Number of children: 2  . Years of education: Not on file  . Highest education level: High school graduate  Occupational History  . Not on file  Tobacco Use  . Smoking status: Current Every Day Smoker  . Smokeless tobacco: Never Used  . Tobacco comment: uses nicotine patch. trying to quit  Vaping Use  . Vaping Use: Never used  Substance and Sexual Activity  . Alcohol use: No  . Drug use: Yes  Types: Marijuana  . Sexual activity: Not on file  Other Topics Concern  . Not on file  Social History Narrative   Pt is married he lives with Joshua [name] wife   He has 2 children   Left handed also can write with right handed   Drinks coffee every morning, tea sometime, soda not often    Social Determinants of Corporate investment banker Strain: Not on file  Food Insecurity: Not on  file  Transportation Needs: Not on file  Physical Activity: Not on file  Stress: Not on file  Social Connections: Not on file  Intimate Partner Violence: Not on file     Objective:  Blood pressure 113/71, pulse 92, resp. rate 18, height 5\' 7"  (1.702 m), weight 142 lb (64.4 kg), SpO2 96 %.  General: No acute distress.  Patient appears well-groomed.   Head:  Normocephalic/atraumatic Eyes:  Fundi examined but not visualized Neck: In neck brace.  Reduced range of motion Neurological Exam: alert and oriented to person, place, and time. Attention span and concentration intact, recent and remote memory intact, fund of knowledge intact.  Speech fluent and not dysarthric, language intact.  CN II-XII intact. Bulk normal.  Tone slightly increased, muscle strength 5/5 throughout.  Sensation to pinprick and vibration intact.  Deep tendon reflexes 2+ throughout except 3+ knees, toes downgoing.  Finger to nose and heel to shin testing intact.  Gait slightly wide-based.  Romberg negative   Assessment/Plan:   1.  Multiple sclerosis 2.  Cervical spinal stenosis s/p ACDF x 2 (C5-7, C3-4) 3.  Chronic pain  1. DMT:  Ocrevus 2.  Pain:  Restart but increase nortriptyline 25mg  at bedtime for one week, then 50mg  at bedtime 3.  Fatigue:  Modafinil 100mg  in AM 4.  D3 4000 IU daily 5.  Check quantitative immunoglobulin panel today and again in 6 months.  Repeat vit d level in 6 months 6.  Repeat MRI of brain and cervical spine with and without contrast in 6 months 7.  Follow up after repeat testing.  , DO  CC:  , FNP

## 2020-03-25 ENCOUNTER — Other Ambulatory Visit: Payer: 59

## 2020-03-25 ENCOUNTER — Ambulatory Visit: Payer: 59 | Admitting: Neurology

## 2020-03-25 ENCOUNTER — Encounter: Payer: Self-pay | Admitting: Neurology

## 2020-03-25 ENCOUNTER — Other Ambulatory Visit: Payer: Self-pay

## 2020-03-25 VITALS — BP 113/71 | HR 92 | Resp 18 | Ht 67.0 in | Wt 142.0 lb

## 2020-03-25 DIAGNOSIS — G35 Multiple sclerosis: Secondary | ICD-10-CM | POA: Diagnosis not present

## 2020-03-25 DIAGNOSIS — M4802 Spinal stenosis, cervical region: Secondary | ICD-10-CM

## 2020-03-25 MED ORDER — MODAFINIL 100 MG PO TABS: 100.0000 mg | ORAL_TABLET | Freq: Every morning | ORAL | 0 refills | Status: DC

## 2020-03-25 MED ORDER — NORTRIPTYLINE HCL 25 MG PO CAPS: ORAL_CAPSULE | ORAL | 0 refills | Status: DC

## 2020-03-25 NOTE — Patient Instructions (Addendum)
1.  Ocrevus 2.  D3 4000 IU daily 3.  Modafinil 100mg  in morning 4.  Restart nortriptyline 25mg  at bedtime for one week, then 50mg  at bedtime 5.  Check quantitative immunoglobulin panel today and again in 6 months.  Check D level in 6 months 6.  Check MRI of brain and cervical spine with and without contrast in 6 months 7.  Follow up in 6 months after repeat testing.  Your provider has requested that you have labwork completed today. Please go to [facility] ([id number]) on the second floor of this building before leaving the office today. You do not need to check in. If you are not called within 15 minutes please check with the front desk.

## 2020-03-26 LAB — IGG, IGA, IGM
IgG (Immunoglobin G), Serum: 1303 mg/dL (ref 600–1640)
IgM, Serum: 51 mg/dL (ref 50–300)
Immunoglobulin A: 164 mg/dL (ref 47–310)

## 2020-04-01 ENCOUNTER — Telehealth: Payer: Self-pay | Admitting: Neurology

## 2020-04-01 DIAGNOSIS — G35 Multiple sclerosis: Secondary | ICD-10-CM

## 2020-04-01 NOTE — Telephone Encounter (Signed)
Patient called and said he is having an MRI soon and he is wanting Dr. Everlena Cooper to add on an MRI of the neck and arm as well. He had a cervical fusion in January and the pain in his neck has not improved. Pain is shooting down his arm from his neck, he said.  He is also requesting a prescription to help him stay still during the MRI.  Medical [address] [place], [place]

## 2020-04-02 NOTE — Telephone Encounter (Signed)
The MRI I just ordered should be done in 6 months, not now, and it should also include the cervical spine with and without contrast.  Can we update this order?  I can prescribe him something to lay still at that time.  However, if he is continuing to have shooting pain from the neck to the arm (which sounds consistent with pinched nerve and not MS), then this should be discussed with the neurosurgeon and/or pain management.

## 2020-04-02 NOTE — Telephone Encounter (Signed)
Pt advised of note below. MRI cervical spine orderd for 6 months.

## 2020-04-07 ENCOUNTER — Encounter: Payer: Self-pay | Admitting: Neurology

## 2020-04-07 NOTE — Progress Notes (Signed)
Received fax approval of Ocrevus- 04/20/20 until 04/20/21. Auth #: [id number].

## 2020-04-25 ENCOUNTER — Telehealth: Payer: Self-pay | Admitting: Neurology

## 2020-04-25 NOTE — Telephone Encounter (Signed)
Patient called and left a message after hours stating he has an MRI on 04/29/20 and said he'd like a medication prescribed to make him more comfortable for the test.  I returned the patient's call and left a message to call back with pharmacy information.

## 2020-04-27 NOTE — Telephone Encounter (Signed)
These MRIs I ordered were to be performed in August (he has a follow up with me on August 29), not this month.  This is a mistake and needs to be rescheduled for August.

## 2020-04-28 NOTE — Telephone Encounter (Signed)
Pt called no answer left a voice mail per DPR MRIs DR Everlena Cooper ordered were to be performed in August (he has a follow up with Dr Everlena Cooper  on August 29), not this month.  This is a mistake and needs to be rescheduled for August. Pt advised to call the office back if he has any questions,

## 2020-04-28 NOTE — Telephone Encounter (Signed)
Pt called and informed that  MRIs Dr Everlena Cooper ordered were to be performed in August (he has a follow up with Dr Everlena Cooper on August 29), not this month.  This is a mistake and needs to be rescheduled for August. Pt verbalized understanding

## 2020-04-28 NOTE — Telephone Encounter (Signed)
Patient called in returning Heather's call 

## 2020-04-29 ENCOUNTER — Other Ambulatory Visit: Payer: 59

## 2020-05-09 ENCOUNTER — Other Ambulatory Visit: Payer: Self-pay | Admitting: Neurosurgery

## 2020-05-09 DIAGNOSIS — M5412 Radiculopathy, cervical region: Secondary | ICD-10-CM

## 2020-05-09 DIAGNOSIS — Z981 Arthrodesis status: Secondary | ICD-10-CM

## 2020-05-18 ENCOUNTER — Ambulatory Visit: Admission: RE | Admit: 2020-05-18 | Payer: 59 | Source: Ambulatory Visit

## 2020-06-04 ENCOUNTER — Ambulatory Visit: Payer: 59

## 2020-06-09 ENCOUNTER — Emergency Department: Payer: 59

## 2020-06-09 ENCOUNTER — Emergency Department
Admission: EM | Admit: 2020-06-09 | Discharge: 2020-06-09 | Disposition: A | Discharge status: Home / Self Care | Payer: 59 | Attending: Emergency Medicine | Admitting: Emergency Medicine

## 2020-06-09 ENCOUNTER — Other Ambulatory Visit: Payer: Self-pay

## 2020-06-09 DIAGNOSIS — F17200 Nicotine dependence, unspecified, uncomplicated: Secondary | ICD-10-CM | POA: Diagnosis not present

## 2020-06-09 DIAGNOSIS — M542 Cervicalgia: Secondary | ICD-10-CM | POA: Diagnosis present

## 2020-06-09 DIAGNOSIS — R0789 Other chest pain: Secondary | ICD-10-CM | POA: Diagnosis not present

## 2020-06-09 DIAGNOSIS — M791 Myalgia, unspecified site: Secondary | ICD-10-CM | POA: Insufficient documentation

## 2020-06-09 DIAGNOSIS — R52 Pain, unspecified: Secondary | ICD-10-CM

## 2020-06-09 MED ORDER — PREDNISONE 50 MG PO TABS: 150.0000 mg | ORAL_TABLET | Freq: Every day | ORAL | 0 refills | Status: AC

## 2020-06-09 MED ORDER — SODIUM CHLORIDE 0.9 % IV SOLN
1000.0000 mg | Freq: Once | INTRAVENOUS | Status: AC
  Administered 2020-06-09: 1000 mg via INTRAVENOUS
  Filled 2020-06-09: qty 8

## 2020-06-09 NOTE — ED Notes (Signed)
States he feels a little better

## 2020-06-09 NOTE — ED Provider Notes (Signed)
[facility] Emergency Department Provider Note   ____________________________________________   Event Date/Time   First MD Initiated Contact with Patient 06/09/20 1107     (approximate)  I have reviewed the triage vital signs and the nursing notes.   HISTORY  Chief Complaint Neck Pain    HPI Joshua [name] is a 50 y.o. male with below stated past medical history the presents for generalized body aches and pains that he states are normal for his "MS flares".  Patient states that he is currently on prednisone 80 mg every other day to try to control the symptoms however over the past few days the pain in his neck as well as chest tightness has increased.  Patient states that it feels like a constricting feeling around his chest does not have any exacerbating or relieving factors.  Patient is currently taking Flexeril and tramadol for these pains but they are only helping minimally.  Patient also states that last time this happened to him he had to use high-dose steroids which were extremely effective.  Patient denies any focal neurologic deficits at this time.  Patient currently denies any vision changes, tinnitus, difficulty speaking, facial droop, sore throat, shortness of breath, abdominal pain, nausea/vomiting/diarrhea, dysuria, or weakness/numbness/paresthesias in any extremity         Past Medical History:  Diagnosis Date  . Allergy   . Anxiety   . Depression   . Multiple sclerosis (HCC)   . Neck pain   . Neuromuscular disorder [facility])     Patient Active Problem List   Diagnosis Date Noted  . Cervical radicular pain 03/14/2019  . 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 Cocaine abuse (HCC) 03/14/2019  . MS (multiple sclerosis) (HCC) 06/07/2013    Past Surgical History:  Procedure Laterality Date  . ANTERIOR CERVICAL DECOMP/DISCECTOMY FUSION N/A 02/20/2020   Procedure: ANTERIOR CERVICAL DECOMPRESSION/DISCECTOMY FUSION 1 LEVEL C3-4;   Surgeon: Venetia Night, MD;  Location: [facility] ORS;  Service: Neurosurgery;  Laterality: N/A;  . CRANIOPLASTY  2005   subdural hematoma  that had to be removed  . NECK SURGERY  2014  . SPINAL FUSION      Prior to Admission medications   Medication Sig Start Date End Date Taking? Authorizing Provider  acetaminophen (TYLENOL) 500 MG tablet Take 2 tablets (1,000 mg total) by mouth every 6 (six) hours as needed for mild pain or moderate pain. 02/20/20   Patsey Berthold, NP  cholecalciferol (VITAMIN D3) 25 MCG (1000 UNIT) tablet Take 1,000 Units by mouth daily.    [provider]  cyclobenzaprine (FLEXERIL) 10 MG tablet Take 10 mg by mouth 3 (three) times daily as needed for muscle spasms.    [provider]  folic acid (FOLVITE) 1 MG tablet Take 1 mg by mouth daily.    [provider]  modafinil (PROVIGIL) 100 MG tablet Take 1 tablet (100 mg total) by mouth in the morning. 03/25/20   Drema Dallas, DO  Multiple Vitamins-Minerals (MULTIVITAMIN WITH MINERALS) tablet Take 1 tablet by mouth daily.    [provider]  nortriptyline (PAMELOR) 25 MG capsule Take 1 capsule at bedtime for a week, then increase to 2 capsules at bedtime 03/25/20   Shon Millet R, DO  PARoxetine (PAXIL) 20 MG tablet Take by mouth.    [provider]  predniSONE (DELTASONE) 50 MG tablet Take 50 mg by mouth daily with breakfast. Patient not taking: Reported on 03/25/2020    [provider]  sildenafil (VIAGRA) 100 MG tablet Take 100 mg by mouth daily as needed for erectile dysfunction.    [provider]    Allergies Penicillins  Family History  Problem Relation Age of Onset  . Hypertension Mother   . Hypertension Father   . Multiple sclerosis Sister   . Aneurysm Brother     Social History Social History   Tobacco Use  . Smoking status: Current Every Day Smoker  . Smokeless tobacco: Never Used  . Tobacco comment: uses nicotine patch. trying to quit   Vaping Use  . Vaping Use: Never used  Substance Use Topics  . Alcohol use: No  . Drug use: Yes    Types: Marijuana    Review of Systems Constitutional: No fever/chills Eyes: No visual changes. ENT: No sore throat. Cardiovascular: Endorses chest pain. Respiratory: Denies shortness of breath. Gastrointestinal: No abdominal pain.  No nausea, no vomiting.  No diarrhea. Genitourinary: Negative for dysuria. Musculoskeletal: Positive for generalized arthralgias Skin: Negative for rash. Neurological: Negative for headaches, weakness/numbness/paresthesias in any extremity Psychiatric: Negative for suicidal ideation/homicidal ideation   ____________________________________________   PHYSICAL EXAM:  VITAL SIGNS: ED Triage Vitals  Enc Vitals Group     BP 06/09/20 1050 (!) 102/57     Pulse Rate 06/09/20 1050 74     Resp 06/09/20 1050 18     Temp 06/09/20 1050 98 F (36.7 C)     Temp Source 06/09/20 1050 Oral     SpO2 06/09/20 1050 99 %     Weight --      Height --      Head Circumference --      Peak Flow --      Pain Score 06/09/20 1052 10     Pain Loc --      Pain Edu? --      Excl. in GC? --    Constitutional: Alert and oriented. Well appearing and in no acute distress. Eyes: Conjunctivae are normal. PERRL. Head: Atraumatic. Nose: No congestion/rhinnorhea. Mouth/Throat: Mucous membranes are moist. Neck: No stridor Cardiovascular: Grossly normal heart sounds.  Good peripheral circulation. Respiratory: Normal respiratory effort.  No retractions. Gastrointestinal: Soft and nontender. No distention. Musculoskeletal: No obvious deformities Neurologic:  Normal speech and language. No gross focal neurologic deficits are appreciated. Skin:  Skin is warm and dry. No rash noted. Psychiatric: Mood and affect are normal. Speech and behavior are normal.  ____________________________________________   LABS (all labs ordered are listed, but only abnormal results are  displayed)  Labs Reviewed - No data to display  PROCEDURES  Procedure(s) performed (including Critical Care):  Procedures   ____________________________________________   INITIAL IMPRESSION / ASSESSMENT AND PLAN / ED COURSE  As part of my medical decision making, I reviewed the following data within the electronic MEDICAL RECORD NUMBER Nursing notes reviewed and incorporated, Labs reviewed, EKG interpreted, Old chart reviewed, Radiograph reviewed and Notes from prior ED visits reviewed and incorporated        + neck pain + Generalized arthralgias  ED Workup: Defer C-Spine imaging given negative by NEXUS criteria  Given History, Exam the patient appears to have chronic postoperative cervical pain.   Patient appears to be low risk for complications or other emergent conditions such as  anginal equivalent, frank cervical instability, arterial dissection, osteomyelitis, epidural abscess, central cord syndrome, c-spine fracture, other spinal emergencies.  Given patient states these are similar to recent MS flare, will treat with high-dose steroids and steroid burst in the outpatient setting as  well as encouraging follow-up with his neurologist for further management.  Rx: Steroid burst Disposition: Discharge. The patient has been given strict return precautions and understands the need to follow up within 48 hours with their primary care provider      ____________________________________________   FINAL CLINICAL IMPRESSION(S) / ED DIAGNOSES  Final diagnoses:  Neck pain  Generalized body aches  Chest tightness     ED Discharge Orders    None       Note:  This document was prepared using Dragon voice recognition software and may include unintentional dictation errors.   Merwyn Katos, MD 06/09/20 1450

## 2020-06-09 NOTE — ED Triage Notes (Addendum)
Pt comes with c/o neck pain for few days. tp states body aches all over as well. Pt denies any injury but states neck surgery back in Jan.  Pt has hx of MS.

## 2020-06-16 ENCOUNTER — Other Ambulatory Visit: Payer: Self-pay

## 2020-06-16 ENCOUNTER — Ambulatory Visit
Admission: RE | Admit: 2020-06-16 | Discharge: 2020-06-16 | Disposition: A | Discharge status: Home / Self Care | Payer: 59 | Source: Ambulatory Visit | Attending: Neurosurgery | Admitting: Neurosurgery

## 2020-06-16 DIAGNOSIS — M5412 Radiculopathy, cervical region: Secondary | ICD-10-CM | POA: Insufficient documentation

## 2020-06-16 DIAGNOSIS — Z981 Arthrodesis status: Secondary | ICD-10-CM | POA: Insufficient documentation

## 2020-07-03 ENCOUNTER — Other Ambulatory Visit: Payer: Self-pay | Admitting: Neurosurgery

## 2020-07-03 DIAGNOSIS — Z981 Arthrodesis status: Secondary | ICD-10-CM

## 2020-08-01 ENCOUNTER — Inpatient Hospital Stay: Payer: 59

## 2020-08-01 ENCOUNTER — Inpatient Hospital Stay: Payer: 59 | Admitting: Nurse Practitioner

## 2020-08-06 ENCOUNTER — Encounter: Payer: Self-pay | Admitting: Oncology

## 2020-08-06 ENCOUNTER — Other Ambulatory Visit: Payer: Self-pay

## 2020-08-06 ENCOUNTER — Inpatient Hospital Stay: Payer: 59

## 2020-08-06 ENCOUNTER — Inpatient Hospital Stay: Payer: 59 | Attending: Nurse Practitioner | Admitting: Oncology

## 2020-08-06 VITALS — BP 100/67 | HR 68 | Temp 97.2°F | Resp 20 | Wt 133.2 lb

## 2020-08-06 DIAGNOSIS — R634 Abnormal weight loss: Secondary | ICD-10-CM

## 2020-08-06 DIAGNOSIS — M791 Myalgia, unspecified site: Secondary | ICD-10-CM | POA: Diagnosis not present

## 2020-08-06 DIAGNOSIS — G35 Multiple sclerosis: Secondary | ICD-10-CM | POA: Diagnosis not present

## 2020-08-06 DIAGNOSIS — E611 Iron deficiency: Secondary | ICD-10-CM | POA: Diagnosis present

## 2020-08-06 DIAGNOSIS — E039 Hypothyroidism, unspecified: Secondary | ICD-10-CM | POA: Diagnosis not present

## 2020-08-06 DIAGNOSIS — Z7952 Long term (current) use of systemic steroids: Secondary | ICD-10-CM

## 2020-08-06 DIAGNOSIS — R630 Anorexia: Secondary | ICD-10-CM | POA: Diagnosis not present

## 2020-08-06 DIAGNOSIS — D649 Anemia, unspecified: Secondary | ICD-10-CM

## 2020-08-06 DIAGNOSIS — R5382 Chronic fatigue, unspecified: Secondary | ICD-10-CM

## 2020-08-06 LAB — CBC WITH DIFFERENTIAL/PLATELET
Abs Immature Granulocytes: 0.01 10*3/uL (ref 0.00–0.07)
Basophils Absolute: 0.1 10*3/uL (ref 0.0–0.1)
Basophils Relative: 1 %
Eosinophils Absolute: 0.3 10*3/uL (ref 0.0–0.5)
Eosinophils Relative: 4 %
HCT: 42.9 % (ref 39.0–52.0)
Hemoglobin: 14.7 g/dL (ref 13.0–17.0)
Immature Granulocytes: 0 %
Lymphocytes Relative: 24 %
Lymphs Abs: 1.5 10*3/uL (ref 0.7–4.0)
MCH: 30.4 pg (ref 26.0–34.0)
MCHC: 34.3 g/dL (ref 30.0–36.0)
MCV: 88.6 fL (ref 80.0–100.0)
Monocytes Absolute: 0.5 10*3/uL (ref 0.1–1.0)
Monocytes Relative: 9 %
Neutro Abs: 3.8 10*3/uL (ref 1.7–7.7)
Neutrophils Relative %: 62 %
Platelets: 229 10*3/uL (ref 150–400)
RBC: 4.84 MIL/uL (ref 4.22–5.81)
RDW: 12.5 % (ref 11.5–15.5)
WBC: 6.2 10*3/uL (ref 4.0–10.5)
nRBC: 0 % (ref 0.0–0.2)

## 2020-08-06 LAB — IRON AND TIBC
Iron: 43 ug/dL — ABNORMAL LOW (ref 45–182)
Saturation Ratios: 14 % — ABNORMAL LOW (ref 17.9–39.5)
TIBC: 316 ug/dL (ref 250–450)
UIBC: 273 ug/dL

## 2020-08-06 LAB — FOLATE: Folate: 8.8 ng/mL (ref >5.9)

## 2020-08-06 LAB — RETICULOCYTES
Immature Retic Fract: 5 % (ref 2.3–15.9)
RBC.: 4.79 MIL/uL (ref 4.22–5.81)
Retic Count, Absolute: 56.5 10*3/uL (ref 19.0–186.0)
Retic Ct Pct: 1.2 % (ref 0.4–3.1)

## 2020-08-06 LAB — TECHNOLOGIST SMEAR REVIEW
Plt Morphology: NORMAL
RBC MORPHOLOGY: NORMAL
WBC MORPHOLOGY: NORMAL

## 2020-08-06 LAB — FERRITIN: Ferritin: 105 ng/mL (ref 24–336)

## 2020-08-06 LAB — VITAMIN B12: Vitamin B-12: 460 pg/mL (ref 180–914)

## 2020-08-06 MED ORDER — PREDNISONE 10 MG (21) PO TBPK: ORAL_TABLET | ORAL | 0 refills | Status: DC

## 2020-08-06 NOTE — Progress Notes (Signed)
Patient here today for new evaluation regarding anemia.  

## 2020-08-06 NOTE — Progress Notes (Signed)
Hematology Consult Note [facility]  [phone] [phone]  Patient Care Team: Armando Gang, FNP as PCP - General (Family Medicine)   Name of the patient: Joshua [name]  [id number]  Jul 06, 1970   Date of visit: 08/06/2020  Chief Complaint: Low iron levels  Oncology History:  Oncology History   No history exists.    Subjective Data: Mr. Gauss is a 51 year old male with PMH significant for MS, chronic neck pain, and multiple back surgeries, most recently and ACDF in January 2022.   Has a history of MS and is followed by neurology. He has previously been on OCREVUS (ocrelizumab) for his MS. He last infusion was in 2021. He does not remember the exact month but reports it was over 9 months ago. He is supposed to get the infusion every 6 months. He is scheduled to see his Neurologist at the end of this month.  He was referred to hematology by Franco Nones, FNP for low iron levels. Labs completed at [facility] on 07/28/2020 show a normal hemoglobin of 13.3, decreased folate 3.4, and low ferritin 34. TSH was also slightly low at 0.332.   He reports fatigue even after sleeping all night. He attributes this to his MS.   He reports poor appetite and he does not eat well. He wants to gain weight.   He denies weight loss. He denies fevers.   He reports the prednisone helps his symptoms and fatigue. "It makes me feel like Superman"   Review of Systems  Constitutional:  Positive for malaise/fatigue. Negative for fever and weight loss.  HENT:  Negative for congestion, hearing loss and nosebleeds.   Eyes:  Negative for blurred vision and double vision.  Respiratory:  Negative for cough, hemoptysis and sputum production.   Cardiovascular:  Negative for chest pain and palpitations.  Gastrointestinal:  Negative for abdominal pain, blood in stool, constipation, diarrhea, heartburn, nausea and vomiting.       Poor appetite   Genitourinary:  Negative for frequency and urgency.  Musculoskeletal:  Negative for myalgias.  Skin:  Negative for rash.  Neurological:  Negative for dizziness, tingling and headaches.  Endo/Heme/Allergies:  Does not bruise/bleed easily.  Psychiatric/Behavioral:  Negative for depression. The patient is not nervous/anxious and does not have insomnia.    ECOG: 0 - Asymptomatic  Past Medical History:  Diagnosis Date   Allergy    Anxiety    Depression    Multiple sclerosis (HCC)    Neck pain    Neuromuscular disorder [facility])    Past Surgical History:  Procedure Laterality Date   ANTERIOR CERVICAL DECOMP/DISCECTOMY FUSION N/A 02/20/2020   Procedure: ANTERIOR CERVICAL DECOMPRESSION/DISCECTOMY FUSION 1 LEVEL C3-4;  Surgeon: Venetia Night, MD;  Location: [facility] ORS;  Service: Neurosurgery;  Laterality: N/A;   CRANIOPLASTY  2005   subdural hematoma  that had to be removed   NECK SURGERY  2014   SPINAL FUSION     Family History  Problem Relation Age of Onset   Hypertension Mother    Hypertension Father    Multiple sclerosis Sister    Aneurysm Brother    Social Hx:  Current Every Day Smoker (1 ppd)  Cocaine Use (2021)  Urine Drug Screen on 02/20/2020 + for cannabinoids He reports smoking 1-2 blunts per day   Allergies  Allergen Reactions   Penicillins Hives    As a child    Current Outpatient Medications on File Prior to Visit  Medication Sig Dispense  Refill   acetaminophen (TYLENOL) 500 MG tablet Take 2 tablets (1,000 mg total) by mouth every 6 (six) hours as needed for mild pain or moderate pain. 30 tablet 0   cholecalciferol (VITAMIN D3) 25 MCG (1000 UNIT) tablet Take 1,000 Units by mouth daily.     cyclobenzaprine (FLEXERIL) 10 MG tablet Take 10 mg by mouth 3 (three) times daily as needed for muscle spasms.     folic acid (FOLVITE) 1 MG tablet Take 1 mg by mouth daily.     modafinil (PROVIGIL) 100 MG tablet Take 1 tablet (100 mg total) by mouth in the morning. 30 tablet  0   Multiple Vitamins-Minerals (MULTIVITAMIN WITH MINERALS) tablet Take 1 tablet by mouth daily.     nortriptyline (PAMELOR) 25 MG capsule Take 1 capsule at bedtime for a week, then increase to 2 capsules at bedtime 60 capsule 0   PARoxetine (PAXIL) 20 MG tablet Take by mouth.     predniSONE (DELTASONE) 50 MG tablet Take 50 mg by mouth daily with breakfast. (Patient not taking: Reported on 03/25/2020)     sildenafil (VIAGRA) 100 MG tablet Take 100 mg by mouth daily as needed for erectile dysfunction.     No current facility-administered medications on file prior to visit.   Objective Data:  BP 100/67   Pulse 68   Temp (!) 97.2 F (36.2 C) (Tympanic)   Resp 20   Wt 60.4 kg   BMI 20.86 kg/m   Physical Exam Vitals and nursing note reviewed.  HENT:     Head: Normocephalic and atraumatic.     Nose: No congestion.     Mouth/Throat:     Mouth: Mucous membranes are moist.     Pharynx: Oropharynx is clear.  Eyes:     General:        Right eye: No discharge.        Left eye: No discharge.     Extraocular Movements: Extraocular movements intact.     Pupils: Pupils are equal, round, and reactive to light.  Cardiovascular:     Rate and Rhythm: Normal rate and regular rhythm.     Pulses: Normal pulses.     Heart sounds: No murmur heard. Pulmonary:     Effort: Pulmonary effort is normal.  Abdominal:     General: Bowel sounds are normal. There is no distension.     Tenderness: There is no abdominal tenderness. There is no guarding.  Musculoskeletal:        General: No swelling or deformity.     Right lower leg: No edema.     Left lower leg: No edema.  Skin:    General: Skin is warm and dry.     Capillary Refill: Capillary refill takes less than 2 seconds.  Neurological:     Mental Status: He is alert and oriented to person, place, and time.  Psychiatric:        Mood and Affect: Mood normal.        Thought Content: Thought content normal.    Assessment and Plan:  Low Iron,  Folate  Likely secondary to comorbidities, malnutrition and noncompliance Most recent labs show normal hemoglobin. He has chronic fatigue Repeat labs today-Iron, TIBC, Folate, Ferritin, and CBC with Differential, smear, MMA, B12, and Copper.  Poor Appetite Trial prednisone taper for both appetite and myalgias.  Consider appetite stimulant medication (megace, marinol) Consult with dietician in 2 weeks at next appointment   Low TSH Repeat TSH Contact PCP regarding further  work   Chronic Myalgias Prednisone taper started x10 days  Follow-up with neurology.   MS Has follow-up scheduled for next month.  Previously on Ocrevus.   Disposition: Labs today.  RTC in 2 weeks to review labs, possible Iv iron and dietician consult.   The patient's diagnosis, an outline of the further diagnostic and laboratory studies which will be required, the recommendation for surgery, and alternatives were discussed with her and her accompanying family members.  All questions were answered to their satisfaction.  I personally had a face to face interaction and evaluated the patient jointly with the NP Student, Mrs. Deneise Lever.  I have reviewed her history and available records and have performed the key portions of the physical exam including general, HEENT, abdominal exam, pelvic exam with my findings confirming those documented above by the APP student.  I have discussed the case with the APP student and the patient.  I agree with the above documentation, assessment and plan which was fully formulated by me.  Counseling was completed by me.    Deneise Lever, Student FNP  Durenda Hurt, NP 08/06/2020 3:46 PM

## 2020-08-07 LAB — THYROID PANEL WITH TSH
Free Thyroxine Index: 1.4 (ref 1.2–4.9)
T3 Uptake Ratio: 24 % (ref 24–39)
T4, Total: 5.9 ug/dL (ref 4.5–12.0)
TSH: 1.25 u[IU]/mL (ref 0.450–4.500)

## 2020-08-08 LAB — COPPER, SERUM: Copper: 121 ug/dL (ref 69–132)

## 2020-08-09 LAB — METHYLMALONIC ACID, SERUM: Methylmalonic Acid, Quantitative: 127 nmol/L (ref 0–378)

## 2020-08-12 ENCOUNTER — Ambulatory Visit: Payer: Self-pay | Admitting: Family Medicine

## 2020-08-12 ENCOUNTER — Other Ambulatory Visit: Payer: Self-pay

## 2020-08-12 ENCOUNTER — Encounter: Payer: Self-pay | Admitting: Family Medicine

## 2020-08-12 DIAGNOSIS — Z113 Encounter for screening for infections with a predominantly sexual mode of transmission: Secondary | ICD-10-CM

## 2020-08-12 LAB — GRAM STAIN

## 2020-08-12 NOTE — Progress Notes (Signed)
Pt here for STD screening.  Gram Stain results reviewed, no treatment required.  Pt given condoms. Berdie Ogren, RN

## 2020-08-12 NOTE — Progress Notes (Signed)
[facility] Department STI clinic/screening visit  Subjective:  Joshua [name] is a 50 y.o. male being seen today for an STI screening visit. The patient reports they do not have symptoms.    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    The following portions of the patient's history were reviewed and updated as appropriate: allergies, current medications, past medical history, past social history, past surgical history and problem list.  Objective:  There were no vitals filed for this visit.  Physical Exam Constitutional:      Appearance: Normal appearance.  HENT:     Head: Normocephalic.     Mouth/Throat:     Mouth: Mucous membranes are moist.     Pharynx: Oropharynx is clear. No oropharyngeal exudate.  Pulmonary:     Effort: Pulmonary effort is normal.  Genitourinary:    Penis: Normal.      Testes: Normal.     Comments: No lice, nits, or pest, no lesions or odor discharge.  Denies pain or tenderness with paplation of testicles.  No lesions, ulcers or masses present.    Musculoskeletal:     Cervical back: Normal range of motion and neck supple.  Lymphadenopathy:     Cervical: No cervical adenopathy.  Skin:    General: Skin is warm and dry.     Findings: No bruising, erythema, lesion or rash.  Neurological:     Mental Status: He is alert.  Psychiatric:        Mood and Affect: Mood normal.        Behavior: Behavior normal.      Assessment and Plan:  Joshua [name] is a 50 y.o. male presenting to the [facility] Department for STI screening  1. Screening examination for venereal disease  - Gonococcus culture - HIV [place] [facility] - Syphilis Serology, [place] [facility] - Gram stain - Gonococcus culture  Patient does not have STI symptoms Patient accepted all screenings including  gram stain,  oral, urethral GC and bloodwork for HIV/RPR.  Patient meets criteria for HepB screening? Yes. Ordered? No - declined  Patient meets criteria for HepC screening? Yes. Ordered? No - declined  Recommended condom use with all sex Discussed importance of condom use for STI prevent  Treat gram stain per standing order Discussed time line for State Lab results and that patient will be called with positive results and encouraged patient to call if he had not heard in 2 weeks Recommended returning for continued or worsening symptoms.       No follow-ups on file.  Future Appointments  Date Time Provider Department Center  08/20/2020  1:00 PM Mauro Kaufmann, NP [facility] None  08/20/2020  1:30 PM [facility] INFUSION CHAIR 16 [facility] None  09/01/2020 12:30 PM GI-315 MR 1 GI-315MRI [facility]  09/01/2020  1:10 PM GI-315 MR 1 GI-315MRI [facility]  09/29/2020  9:50 AM Drema Dallas, DO [facility] None  10/03/2020 11:00 AM [facility]    Wendi Snipes, FNP

## 2020-08-14 LAB — HM HIV SCREENING LAB: HM HIV Screening: NEGATIVE

## 2020-08-16 LAB — GONOCOCCUS CULTURE

## 2020-08-20 ENCOUNTER — Inpatient Hospital Stay (HOSPITAL_BASED_OUTPATIENT_CLINIC_OR_DEPARTMENT_OTHER): Payer: 59 | Admitting: Oncology

## 2020-08-20 ENCOUNTER — Inpatient Hospital Stay: Payer: 59

## 2020-08-20 VITALS — BP 102/62 | HR 55 | Resp 16

## 2020-08-20 DIAGNOSIS — D508 Other iron deficiency anemias: Secondary | ICD-10-CM

## 2020-08-20 DIAGNOSIS — D509 Iron deficiency anemia, unspecified: Secondary | ICD-10-CM | POA: Insufficient documentation

## 2020-08-20 DIAGNOSIS — E611 Iron deficiency: Secondary | ICD-10-CM | POA: Diagnosis not present

## 2020-08-20 MED ORDER — IRON SUCROSE 20 MG/ML IV SOLN
200.0000 mg | Freq: Once | INTRAVENOUS | Status: AC
  Administered 2020-08-20: 200 mg via INTRAVENOUS
  Filled 2020-08-20: qty 10

## 2020-08-20 MED ORDER — SODIUM CHLORIDE 0.9 % IV SOLN
Freq: Once | INTRAVENOUS | Status: AC
  Filled 2020-08-20: qty 250

## 2020-08-20 MED ORDER — SODIUM CHLORIDE 0.9 % IV SOLN: 200.0000 mg | Freq: Once | INTRAVENOUS | Status: DC

## 2020-08-20 NOTE — Progress Notes (Addendum)
Nutrition Assessment   Reason for Assessment:  Patient wants to gain weight   ASSESSMENT:  50 year old male with iron deficiency anemia.  Past medical history of multiple sclerosis, anxiety, neck pain, depression.    Acute add on from NP, Sonia Baller today.    Met with patient during infusion.  Patient reports fatigue and poor appetite.  Says that he eats a bologna and egg sandwich during the day and that is it.  Has tried drinking ensure and likes them.  Denies nausea, abdominal pain.   States that he cooks but once he is finished cooking does not feel like eating.   Medications: Vit D, folic acid, predinsone (has 2 days left)   Labs: reviewed   Anthropometrics:   Height: 67 inches Weight: 135 lb UBW: 145-150 lb 150 in Jan 2022 BMI: 21  10% weight loss in the last 6 months   Estimated Energy Needs  Kcals: 1800-2100 Protein: 90-105 g Fluid: 1.8 L   NUTRITION DIAGNOSIS: Inadequate oral intake related to fatigue, poor appetite as evidenced by 10% weight loss in the last 6 months   INTERVENTION:  Discussed ways to add calories and protein to diet to promote weight gain.  Handout provided along with high calorie snack list. Encouraged patient to keep food diary Discussed oral nutrition supplements and samples given (ensure plus, boost plus, [employer] 1.4, Reason) and discussed how to purchase shakes.   Provided recipe booklet with shakes, smoothies, high calorie food items included Patient may benefit from trial of appetite stimulant. Contact information provided   MONITORING, EVALUATION, GOAL: weight trends, intake   Next Visit: Wed, Sept 14th in clinic, wt check  Kinjal Neitzke B. Zenia Resides, [place], [place] Registered Dietitian [phone] (mobile)

## 2020-08-20 NOTE — Progress Notes (Signed)
Hematology Consult Note [facility]  Telephone:([phone] [phone]  Patient Care Team: Armando Gang, FNP as PCP - General (Family Medicine)   Name of the patient: Joshua [name]  [id number]  12/04/1970   Date of visit: 08/20/2020  Chief Complaint: Low iron levels  Oncology History:  Oncology History   No history exists.    Hematology History: Joshua [name] is a 50 year old male with PMH significant for MS, chronic neck pain, and multiple back surgeries, most recently and ACDF in January 2022.    Has a history of MS and is followed by neurology. He has previously been on OCREVUS (ocrelizumab) for his MS. He last infusion was in 2021. He does not remember the exact month but reports it was over 9 months ago. He is supposed to get the infusion every 6 months. He is scheduled to see his Neurologist at the end of this month.   He was referred to hematology by Franco Nones, FNP for low iron levels. Labs completed at [facility] on 07/28/2020 show a normal hemoglobin of 13.3, decreased folate 3.4, and low ferritin 34. TSH was also slightly low at 0.332.    He reports fatigue even after sleeping all night. He attributes this to his MS.    He reports poor appetite and he does not eat well. He wants to gain weight.   He denies weight loss. He denies fevers.    He reports the prednisone helps his symptoms and fatigue. "It makes me feel like Superman"   Interval history: He presents back today to review lab work and possible IV iron.  He was given a short course of steroids which did help his energy, appetite and prevent additional weight loss.  He has not seen his neurologist but plans to get an appointment in the near future.  Is very interested in meeting with our dietitian.  Review of Systems  Constitutional:  Positive for malaise/fatigue. Negative for fever and weight loss.  HENT:  Negative for congestion and hearing loss.   Eyes:   Negative for blurred vision and double vision.  Respiratory:  Negative for cough.   Cardiovascular:  Negative for chest pain and palpitations.  Gastrointestinal:  Negative for abdominal pain, constipation, diarrhea, nausea and vomiting.       Poor appetite. Dietician consult today  Genitourinary:  Negative for frequency and urgency.  Skin:  Negative for rash.  Neurological:  Negative for dizziness, tingling and headaches.  Endo/Heme/Allergies:  Does not bruise/bleed easily.  Psychiatric/Behavioral:  Negative for depression. The patient is not nervous/anxious and does not have insomnia.     ECOG: 1 - Symptomatic but completely ambulatory  Past Medical History:  Diagnosis Date   Allergy    Anxiety    Depression    Multiple sclerosis (HCC)    Neck pain    Neuromuscular disorder [facility])    Past Surgical History:  Procedure Laterality Date   ANTERIOR CERVICAL DECOMP/DISCECTOMY FUSION N/A 02/20/2020   Procedure: ANTERIOR CERVICAL DECOMPRESSION/DISCECTOMY FUSION 1 LEVEL C3-4;  Surgeon: Venetia Night, MD;  Location: [facility] ORS;  Service: Neurosurgery;  Laterality: N/A;   CRANIOPLASTY  2005   subdural hematoma  that had to be removed   NECK SURGERY  2014   SPINAL FUSION     Family History  Problem Relation Age of Onset   Hypertension Mother    Hypertension Father    Multiple sclerosis Sister    Aneurysm Brother    Social Hx:  Current Every Day Smoker (1 ppd)  Cocaine Use (2021)  Urine Drug Screen on 02/20/2020 + for cannabinoids He reports smoking 1-2 blunts per day   Allergies  Allergen Reactions   Penicillins Hives    As a child    Current Outpatient Medications on File Prior to Visit  Medication Sig Dispense Refill   acetaminophen (TYLENOL) 500 MG tablet Take 2 tablets (1,000 mg total) by mouth every 6 (six) hours as needed for mild pain or moderate pain. (Patient not taking: No sig reported) 30 tablet 0   cholecalciferol (VITAMIN D3) 25 MCG (1000 UNIT) tablet Take  1,000 Units by mouth daily. (Patient not taking: No sig reported)     cyclobenzaprine (FLEXERIL) 10 MG tablet Take 10 mg by mouth 3 (three) times daily as needed for muscle spasms. (Patient not taking: Reported on 08/12/2020)     folic acid (FOLVITE) 1 MG tablet Take 1 mg by mouth daily. (Patient not taking: No sig reported)     HYDROcodone-acetaminophen (NORCO/VICODIN) 5-325 MG tablet Take 1 tablet by mouth daily as needed. (Patient not taking: Reported on 08/12/2020)     modafinil (PROVIGIL) 100 MG tablet Take 1 tablet (100 mg total) by mouth in the morning. (Patient not taking: No sig reported) 30 tablet 0   Multiple Vitamins-Minerals (MULTIVITAMIN WITH MINERALS) tablet Take 1 tablet by mouth daily. (Patient not taking: No sig reported)     nortriptyline (PAMELOR) 25 MG capsule Take 1 capsule at bedtime for a week, then increase to 2 capsules at bedtime (Patient not taking: No sig reported) 60 capsule 0   PARoxetine (PAXIL) 20 MG tablet Take by mouth. (Patient not taking: No sig reported)     predniSONE (DELTASONE) 50 MG tablet Take 50 mg by mouth daily with breakfast. (Patient not taking: No sig reported)     predniSONE (STERAPRED UNI-PAK 21 TAB) 10 MG (21) TBPK tablet Take 60 mg (2 days), 50mg  (2 days), 40 mg (2 days), 30 mg (2 days), 20 mg (2 days) and 10 mg ( 2 days). 42 tablet 0   sildenafil (VIAGRA) 100 MG tablet Take 100 mg by mouth daily as needed for erectile dysfunction. (Patient not taking: No sig reported)     No current facility-administered medications on file prior to visit.   Objective Data:  There were no vitals taken for this visit.  Physical Exam Vitals and nursing note reviewed.  HENT:     Head: Normocephalic and atraumatic.     Nose: No congestion.     Mouth/Throat:     Mouth: Mucous membranes are moist.     Pharynx: Oropharynx is clear.  Eyes:     General:        Right eye: No discharge.        Left eye: No discharge.     Extraocular Movements: Extraocular  movements intact.     Pupils: Pupils are equal, round, and reactive to light.  Cardiovascular:     Rate and Rhythm: Normal rate and regular rhythm.     Pulses: Normal pulses.     Heart sounds: No murmur heard. Pulmonary:     Effort: Pulmonary effort is normal.  Abdominal:     General: Bowel sounds are normal. There is no distension.     Tenderness: There is no abdominal tenderness. There is no guarding.  Musculoskeletal:        General: No swelling or deformity.     Right lower leg: No edema.  Left lower leg: No edema.  Skin:    General: Skin is warm and dry.     Capillary Refill: Capillary refill takes less than 2 seconds.  Neurological:     Mental Status: He is alert and oriented to person, place, and time.  Psychiatric:        Mood and Affect: Mood normal.        Thought Content: Thought content normal.    Assessment and Plan:  Low Iron, Folate  Likely secondary to comorbidities, malnutrition and noncompliance Most recent labs show normal hemoglobin. He has chronic fatigue. Labs from 08/06/2020 show iron saturation 14% with a normal hemoglobin of 14.7.  Differential is unremarkable.  Copper and vitamin B12 level are normal. Given he is symptomatic with significant fatigue and weight loss will give 1 dose of IV Venofer today.  Poor Appetite Short course of steroids given on 08/08/2020 with improvement of his appetite and energy level. Patient to meet with our dietitian to help with weight gain. Could consider a appetite stimulant such as Marinol or Megace in the future.  Low TSH- resolved TSH recheck, normal   Chronic Myalgias/Fatigue Secondary to comorbidities MS and low iron levels Prednisone taper completed.  Needs to reestablish with Neurology.   Disposition: Dietitian consult today. 1 dose IV Venofer today. RTC in 3 months with lab work and MD assessment.  The patient's diagnosis, an outline of the further diagnostic and laboratory studies which will be  required, the recommendation for surgery, and alternatives were discussed with her and her accompanying family members.  All questions were answered to their satisfaction.  I personally had a face to face interaction and evaluated the patient jointly with the NP Student, Mrs. Deneise Lever.  I have reviewed her history and available records and have performed the key portions of the physical exam including general, HEENT, abdominal exam, pelvic exam with my findings confirming those documented above by the APP student.  I have discussed the case with the APP student and the patient.  I agree with the above documentation, assessment and plan which was fully formulated by me.  Counseling was completed by me.    Deneise Lever, Student FNP  I spent 30 minutes dedicated to the care of this patient (face-to-face and non-face-to-face) on the date of the encounter to include what is described in the assessment and plan.   Durenda Hurt, NP 08/20/2020 9:39 AM

## 2020-08-20 NOTE — Progress Notes (Signed)
Pt tolerated venofer infusion well with no problems or complaints.  He was observed for 30 minutes post infusion and left the infusion suite stable and ambulatory.

## 2020-08-20 NOTE — Patient Instructions (Signed)

## 2020-08-21 ENCOUNTER — Encounter: Payer: Self-pay | Admitting: Oncology

## 2020-08-25 ENCOUNTER — Inpatient Hospital Stay: Payer: 59

## 2020-08-25 VITALS — BP 101/68 | HR 74 | Temp 98.2°F | Resp 18

## 2020-08-25 DIAGNOSIS — D508 Other iron deficiency anemias: Secondary | ICD-10-CM

## 2020-08-25 DIAGNOSIS — E611 Iron deficiency: Secondary | ICD-10-CM | POA: Diagnosis not present

## 2020-08-25 MED ORDER — SODIUM CHLORIDE 0.9 % IV SOLN: 200.0000 mg | Freq: Once | INTRAVENOUS | Status: DC

## 2020-08-25 MED ORDER — IRON SUCROSE 20 MG/ML IV SOLN
200.0000 mg | Freq: Once | INTRAVENOUS | Status: AC
  Administered 2020-08-25: 200 mg via INTRAVENOUS
  Filled 2020-08-25: qty 10

## 2020-08-25 MED ORDER — SODIUM CHLORIDE 0.9 % IV SOLN
Freq: Once | INTRAVENOUS | Status: AC
  Filled 2020-08-25: qty 250

## 2020-08-25 NOTE — Patient Instructions (Addendum)
[facility]  Discharge Instructions: Thank you for choosing [facility] to provide your oncology and hematology care.  If you have a lab appointment with the Cancer Center, please go directly to the Cancer Center and check in at the registration area.  Wear comfortable clothing and clothing appropriate for easy access to any Portacath or PICC line.   We strive to give you quality time with your provider. You may need to reschedule your appointment if you arrive late (15 or more minutes).  Arriving late affects you and other patients whose appointments are after yours.  Also, if you miss three or more appointments without notifying the office, you may be dismissed from the clinic at the provider's discretion.      For prescription refill requests, have your pharmacy contact our office and allow 72 hours for refills to be completed.    Today you received the following chemotherapy and/or immunotherapy agents VENOFER      To help prevent nausea and vomiting after your treatment, we encourage you to take your nausea medication as directed.  BELOW ARE SYMPTOMS THAT SHOULD BE REPORTED IMMEDIATELY: *FEVER GREATER THAN 100.4 F (38 C) OR HIGHER *CHILLS OR SWEATING *NAUSEA AND VOMITING THAT IS NOT CONTROLLED WITH YOUR NAUSEA MEDICATION *UNUSUAL SHORTNESS OF BREATH *UNUSUAL BRUISING OR BLEEDING *URINARY PROBLEMS (pain or burning when urinating, or frequent urination) *BOWEL PROBLEMS (unusual diarrhea, constipation, pain near the anus) TENDERNESS IN MOUTH AND THROAT WITH OR WITHOUT PRESENCE OF ULCERS (sore throat, sores in mouth, or a toothache) UNUSUAL RASH, SWELLING OR PAIN  UNUSUAL VAGINAL DISCHARGE OR ITCHING   Items with * indicate a potential emergency and should be followed up as soon as possible or go to the Emergency Department if any problems should occur.  Please show the CHEMOTHERAPY ALERT CARD or IMMUNOTHERAPY ALERT CARD at check-in to  the Emergency Department and triage nurse.  Should you have questions after your visit or need to cancel or reschedule your appointment, please contact [facility]  [phone] and follow the prompts.  Office hours are 8:00 a.m. to 4:30 p.m. Monday - Friday. Please note that voicemails left after 4:00 p.m. may not be returned until the following business day.  We are closed weekends and major holidays. You have access to a nurse at all times for urgent questions. Please call the main number to the clinic [phone] and follow the prompts.  For any non-urgent questions, you may also contact your provider using MyChart. We now offer e-Visits for anyone 18 and older to request care online for non-urgent symptoms. For details visit [email or web address].   Also download the MyChart app! Go to the app store, search "MyChart", open the app, select [facility], and log in with your MyChart username and password.  Due to Covid, a mask is required upon entering the hospital/clinic. If you do not have a mask, one will be given to you upon arrival. For doctor visits, patients may have 1 support person aged 18 or older with them. For treatment visits, patients cannot have anyone with them due to current Covid guidelines and our immunocompromised population.   Iron Sucrose injection What is this medication? IRON SUCROSE (AHY ern SOO krohs) is an iron complex. Iron is used to make healthy red blood cells, which carry oxygen and nutrients throughout the body. This medicine is used to treat iron deficiency anemia in people with chronickidney disease. This medicine may be used for other   purposes; ask your health care provider orpharmacist if you have questions. COMMON BRAND NAME(S): Venofer What should I tell my care team before I take this medication? They need to know if you have any of these conditions: anemia not caused by low iron levels heart disease high levels of  iron in the blood kidney disease liver disease an unusual or allergic reaction to iron, other medicines, foods, dyes, or preservatives pregnant or trying to get pregnant breast-feeding How should I use this medication? This medicine is for infusion into a vein. It is given by a health careprofessional in a hospital or clinic setting. Talk to your pediatrician regarding the use of this medicine in children. While this drug may be prescribed for children as young as 2 years for selectedconditions, precautions do apply. Overdosage: If you think you have taken too much of this medicine contact apoison control center or emergency room at once. NOTE: This medicine is only for you. Do not share this medicine with others. What if I miss a dose? It is important not to miss your dose. Call your doctor or health careprofessional if you are unable to keep an appointment. What may interact with this medication? Do not take this medicine with any of the following medications: deferoxamine dimercaprol other iron products This medicine may also interact with the following medications: chloramphenicol deferasirox This list may not describe all possible interactions. Give your health care provider a list of all the medicines, herbs, non-prescription drugs, or dietary supplements you use. Also tell them if you smoke, drink alcohol, or use illegaldrugs. Some items may interact with your medicine. What should I watch for while using this medication? Visit your doctor or healthcare professional regularly. Tell your doctor or healthcare professional if your symptoms do not start to get better or if theyget worse. You may need blood work done while you are taking this medicine. You may need to follow a special diet. Talk to your doctor. Foods that contain iron include: whole grains/cereals, dried fruits, beans, or peas, leafy greenvegetables, and organ meats (liver, kidney). What side effects may I notice from  receiving this medication? Side effects that you should report to your doctor or health care professionalas soon as possible: allergic reactions like skin rash, itching or hives, swelling of the face, lips, or tongue breathing problems changes in blood pressure cough fast, irregular heartbeat feeling faint or lightheaded, falls fever or chills flushing, sweating, or hot feelings joint or muscle aches/pains seizures swelling of the ankles or feet unusually weak or tired Side effects that usually do not require medical attention (report to yourdoctor or health care professional if they continue or are bothersome): diarrhea feeling achy headache irritation at site where injected nausea, vomiting stomach upset tiredness This list may not describe all possible side effects. Call your doctor for medical advice about side effects. You may report side effects to FDA at1-800-FDA-1088. Where should I keep my medication? This drug is given in a hospital or clinic and will not be stored at home. NOTE: This sheet is a summary. It may not cover all possible information. If you have questions about this medicine, talk to your doctor, pharmacist, orhealth care provider.  2022 Elsevier/Gold Standard (2010-10-29 17:14:35)  

## 2020-09-01 ENCOUNTER — Other Ambulatory Visit: Payer: 59

## 2020-09-04 ENCOUNTER — Emergency Department
Admission: EM | Admit: 2020-09-04 | Discharge: 2020-09-04 | Disposition: A | Discharge status: Home / Self Care | Payer: 59 | Attending: Emergency Medicine | Admitting: Emergency Medicine

## 2020-09-04 ENCOUNTER — Other Ambulatory Visit: Payer: Self-pay

## 2020-09-04 ENCOUNTER — Encounter: Payer: Self-pay | Admitting: *Deleted

## 2020-09-04 DIAGNOSIS — M549 Dorsalgia, unspecified: Secondary | ICD-10-CM | POA: Diagnosis not present

## 2020-09-04 DIAGNOSIS — Z5321 Procedure and treatment not carried out due to patient leaving prior to being seen by health care provider: Secondary | ICD-10-CM | POA: Insufficient documentation

## 2020-09-04 DIAGNOSIS — R519 Headache, unspecified: Secondary | ICD-10-CM | POA: Insufficient documentation

## 2020-09-04 DIAGNOSIS — M542 Cervicalgia: Secondary | ICD-10-CM | POA: Insufficient documentation

## 2020-09-04 DIAGNOSIS — R7309 Other abnormal glucose: Secondary | ICD-10-CM | POA: Diagnosis not present

## 2020-09-04 LAB — COMPREHENSIVE METABOLIC PANEL
ALT: 18 U/L (ref 0–44)
AST: 28 U/L (ref 15–41)
Albumin: 4.5 g/dL (ref 3.5–5.0)
Alkaline Phosphatase: 56 U/L (ref 38–126)
Anion gap: 9 (ref 5–15)
BUN: 10 mg/dL (ref 6–20)
CO2: 23 mmol/L (ref 22–32)
Calcium: 8.9 mg/dL (ref 8.9–10.3)
Chloride: 101 mmol/L (ref 98–111)
Creatinine, Ser: 0.86 mg/dL (ref 0.61–1.24)
GFR, Estimated: >60 mL/min (ref >60)
Glucose, Bld: 122 mg/dL — ABNORMAL HIGH (ref 70–99)
Potassium: 3.1 mmol/L — ABNORMAL LOW (ref 3.5–5.1)
Sodium: 133 mmol/L — ABNORMAL LOW (ref 135–145)
Total Bilirubin: 0.8 mg/dL (ref 0.3–1.2)
Total Protein: 7.6 g/dL (ref 6.5–8.1)

## 2020-09-04 LAB — CBC
HCT: 38.1 % — ABNORMAL LOW (ref 39.0–52.0)
Hemoglobin: 13.7 g/dL (ref 13.0–17.0)
MCH: 31.5 pg (ref 26.0–34.0)
MCHC: 36 g/dL (ref 30.0–36.0)
MCV: 87.6 fL (ref 80.0–100.0)
Platelets: 198 10*3/uL (ref 150–400)
RBC: 4.35 MIL/uL (ref 4.22–5.81)
RDW: 12.9 % (ref 11.5–15.5)
WBC: 5.3 10*3/uL (ref 4.0–10.5)
nRBC: 0 % (ref 0.0–0.2)

## 2020-09-04 LAB — CBG MONITORING, ED: Glucose-Capillary: 124 mg/dL — ABNORMAL HIGH (ref 70–99)

## 2020-09-04 NOTE — ED Triage Notes (Addendum)
Pt reporting headache, neck pain and back pain x 2 days. Sensitive to lights and sounds. No nausea, vomiting or weakness. No fevers at home. Pt reports hx of migraines but has not had one in "years." Pt is not cooperating well with RN and only answering some questions. Head is covered in triage and when RN uncovered pt yelled out stating it hurt.  No obvious neurologic deficits.

## 2020-09-11 ENCOUNTER — Telehealth: Payer: Self-pay | Admitting: Neurology

## 2020-09-11 NOTE — Telephone Encounter (Signed)
Pt has an MRI scheduled, and would like something sent in to help him relax. The pharmacy is [facility] in [place].

## 2020-09-12 NOTE — Telephone Encounter (Signed)
Ok to send Valium 5mg : take 1 tablet 30 mins prior to MRI. May take second dose if needed. Dispense #2 with no refills. Thanks

## 2020-09-15 ENCOUNTER — Other Ambulatory Visit: Payer: 59

## 2020-09-15 MED ORDER — DIAZEPAM 5 MG PO TABS: ORAL_TABLET | ORAL | 0 refills | Status: DC

## 2020-09-15 NOTE — Telephone Encounter (Signed)
Rx has been printed and signed by Dr. Everlena Cooper. I have faxed rx to pharmacy. Tried to call patient but no voicemail set up.

## 2020-09-17 ENCOUNTER — Telehealth: Payer: Self-pay

## 2020-09-17 NOTE — Telephone Encounter (Signed)
Message received from [facility], Pt was d/c back in April due to not contacting them to get his Ocrevus infusion.    Rep Loraine Leriche wanted to know if the is still on the Ocrevus and will he need to start over or continue the maintenance  dose if he is to restart?
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
## 2020-09-26 NOTE — Progress Notes (Signed)
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  Restart Ocrevus.  As it has been almost a year, will have him repeat split starting dose Check quantitative immunoglobulin panel and vit D Restart nortriptyline and modafinil Recommended to use a pill box to help remember medications Continue follow up with pain management Follow up 6 months.   Subjective:  Joshua [name] is a 50 year old left-handed African American male s/p C5-7 and C3-4 ACDF who follows up for multiple sclerosis.  MRIs from May and Saturday personally reviewed.  Accompanied by his wife via phone.   UPDATE: Current DMT:  Ocrevus.  Last infusion was in October.  He never got his infusion in April because they never came to his home.   Current medications:  none (ran out of meds and doesn't remember what he is supposed to take).  06/16/2020 MRI C-SPINE W WO:  Chronic demyelinating lesions C2 and C5, consistent with history of multiple sclerosis. No new lesions.  Prior C3-C5 and C6-C7 ACDF. Unchanged adjacent segment disease at C5-C6 with moderate bilateral neuroforaminal stenosis. 09/27/2020 MRI BRAIN W WO:  1. No significant interval change in distribution and number of chronic demyelinating lesions involving the supratentorial cerebral white matter. No new lesions or evidence for active demyelination. 09/27/2020 MRI C-SPINE W WO:  1. Chronic demyelinating lesions at C2, C5, and within the upper thoracic cord, stable. No new lesions to suggest disease progression or active demyelination.  2. Prior ACDF at C3-C5 and C6-7. Adjacent segment disease at C5-6 with mild left greater than right C5 foraminal narrowing, stable.  03/25/2020 LABS:  IgA 164, IgG 1,303, IgM 51 08/06/2020 LABS:  B12 460; MMA 127;  Copper 121 08/12/2020 LABS:  Syphilis serology negative. 08/14/2020 LABS:  HIV negative   He says he fell less than a month ago.  His wife told him.  He doesn't remember it.   Vision:  No issues Motor:  No weakness.  He reports that sometimes his legs start shaking when he first wakes up, lasting up to a minute. Sensory:  Paresthesias involving all extremities.  NCV-EMG from 05/27/2014 showed right ulnar neuropathy Pain:  Chronic diffuse pain.  Chronic neck pain s/p ACDF.  No improvement in pain despite surgery.  Chronic back pain.  Chronic chest pain.  Referred to pain management, Dr. Cherylann Ratel Gait:  Unsteady gait.   Bowel/Bladder:  No issues Fatigue:  Daytime fatigue.  Poor sleep Cognition:  Reports short term memory deficits.  He forgets things to happened the previous day.  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  Mood:  Depressed.  Trouble sleeping. Erectile dysfunction   HISTORY:  Initially presented with hand numbness and diffuse pain.  MRI of brain and cervical cord at the time revealed numerous periventricular and cervical spinal cord lesions.  He did not follow up with neurology and continued to experience diffuse pain, arm and hand numbness, unsteady gait with falls and "MS hug".   Other pertinent history: 2005 Subdural hematoma: "spontaneous bleed" in setting of cocaine use and high blood pressure s/p surgery on the left skull region.  07/12/2013 ACDF C4-5 and C6-7.  Headaches resolved.  Still with neck pain. Right ulnar neuropathy on NCV-EMG 05/27/2014. History of alcoholism and drug addiction.   Family History:  Sister (diagnosed with MS in her 97s)  Past disease modifying therapy:  Tecfidera (reports that it didn't work) Past medications:  Cymbalta 30mg  daily; gabapentin, Lyrica, nortriptyline, Flexeril, Modafinil, paroxetine   Imaging: 11/13/2009 MRI BRAIN W WO:  Multiple periventricular, deep and subcortical white matter lesions,  including characteristic perpendicular lesion adjacent to the right ventricle. 11/13/2009 MRI CERVICAL & THORACIC SPINE W WO:  T2/STIR hyperintense lesions in the cervical medullary junction, C5, T1-2, T7-8, and T9  vertebral levels without evidence of enhancement.  Multilevel degenerative changes of the cervical spine. 07/07/2010 MRI CERVICAL SPINE W WO (personally reviewed):  Nonenhancing lesions at C1 and C5 levels. 01/04/2012 MRI BRAIN W WO (personally reviewed):  Two tiny nonspecific nonenhancing hyperintense foci in subcortical white matter. 05/17/2013 MRI CERVICAL SPINE WO:  Multiple nonenhancing lesions within spinal cord, reportedly stable compared to imaging from 07/07/2010.  Spondylosis with right paracentral disc herniation without neural compression at C3-4, broad based disc protrusion with bilateral neural foraminal stenosis compressing C7 nerve roots at C6-7, and left-sided facet arthropathy with edema at C7-T1 09/11/2013 MRI CERVICAL SPINE WO:  S/p ACDF at C4-5 and C6-7, increased disc herniation and spinal stenosis at C3-4, chronjic left facet arthropathy at C7-T1. 06/07/2014 MRI BRAIN W WO: Multiple T2 hyperintense lesions within periventricular and subcortical white matter without abnormal enhancement. 06/07/2014 MRI BRAIN W WO: Multiple T2 hyperintense peripheral cervical spinal cord lesions from the level of the C1 arch to the T2 level without abnormal enhancement.  Status post C4-C7 ACDF.  Small disc bulge at C3-C4 resulting in mild spinal canal stenosis.  02/13/2019 MRI BRAIN W WO:  1. Mild patchy T2/FLAIR signal abnormality involving the periventricular and juxta cortical supratentorial cerebral white matter, consistent with multiple sclerosis. Overall, appearance is mildly progressed relative to 2013. No evidence for active demyelination.  2. Underlying mildly progressed cerebral atrophy for age.  3. Chronic right maxillary sinusitis. 02/13/2019 MRI CERVICAL SPINE W WO:  1. Patchy  multifocal cord signal abnormality involving the cervical spinal cord as above, consistent with history of multiple sclerosis.  Overall, appearance is minimally progressed relative to 2015. No evidence for active demyelination.  2. Prior ACDF at C4 through C7 without residual stenosis.  3. Adjacent segment disease with central disc protrusion at C3-4 indenting the ventral thecal sac with resultant moderate spinal stenosis and moderate right and mild left C4 foraminal stenosis. 10/20/2019 MRI BRAIN W WO: Stable 10/20/2019 MRI CERVICAL SPINE W WO:  Stable.  Moderate spinal stenosis at C3-4.  No spinal stenosis at fused C5-7 levels.  Small disc central disc protrusion indenting ventral thecal sac at C2-3 without significant stenosis.     Labs: 04/22/2014 positive JC Virus Ab with index 1.28 Reportedly underwent lumbar puncture for CSF analysis, revealing 10 oligoclonal bands.  PAST MEDICAL HISTORY: Past Medical History:  Diagnosis Date   Allergy    Anxiety    Depression    Multiple sclerosis (HCC)    Neck pain    Neuromuscular disorder (HCC)     MEDICATIONS: Current Outpatient Medications on File Prior to Visit  Medication Sig Dispense Refill   acetaminophen (TYLENOL) 500 MG tablet Take 2 tablets (1,000 mg total) by mouth every 6 (six) hours as needed for mild pain or moderate pain. (Patient not taking: No sig reported) 30 tablet 0   cholecalciferol (VITAMIN D3) 25 MCG (1000 UNIT) tablet Take 1,000 Units by mouth daily. (Patient not taking: No sig reported)     cyclobenzaprine (FLEXERIL) 10 MG tablet Take 10  mg by mouth 3 (three) times daily as needed for muscle spasms.     diazepam (VALIUM) 5 MG tablet take 1 tablet 30 mins prior to MRI. May take second dose if needed. 2 tablet 0   folic acid (FOLVITE) 1 MG tablet Take 1 mg by mouth daily. (Patient not taking: No sig reported)     HYDROcodone-acetaminophen (NORCO/VICODIN) 5-325 MG tablet Take 1 tablet by mouth daily as needed.     modafinil  (PROVIGIL) 100 MG tablet Take 1 tablet (100 mg total) by mouth in the morning. (Patient not taking: No sig reported) 30 tablet 0   Multiple Vitamins-Minerals (MULTIVITAMIN WITH MINERALS) tablet Take 1 tablet by mouth daily. (Patient not taking: No sig reported)     nortriptyline (PAMELOR) 25 MG capsule Take 1 capsule at bedtime for a week, then increase to 2 capsules at bedtime (Patient not taking: No sig reported) 60 capsule 0   PARoxetine (PAXIL) 20 MG tablet Take by mouth. (Patient not taking: No sig reported)     predniSONE (DELTASONE) 10 MG tablet Take by mouth.     predniSONE (DELTASONE) 50 MG tablet Take 50 mg by mouth daily with breakfast. (Patient not taking: Reported on 08/20/2020)     sildenafil (VIAGRA) 100 MG tablet Take 100 mg by mouth daily as needed for erectile dysfunction. (Patient not taking: No sig reported)     No current facility-administered medications on file prior to visit.    ALLERGIES: Allergies  Allergen Reactions   Penicillins Hives    As a child     FAMILY HISTORY: Family History  Problem Relation Age of Onset   Hypertension Mother    Hypertension Father    Multiple sclerosis Sister    Aneurysm Brother       Objective:  Blood pressure 106/70, pulse 83, height 5\' 7"  (1.702 m), weight 131 lb 12.8 oz (59.8 kg), SpO2 97 %. General: No acute distress.  Patient appears well-groomed.   Head:  Normocephalic/atraumatic Eyes:  Fundi examined but not visualized Neck: supple, no paraspinal tenderness, full range of motion Heart:  Regular rate and rhythm Lungs:  Clear to auscultation bilaterally Back: No paraspinal tenderness Neurological Exam: alert and oriented to person, place, and time.  Speech fluent and not dysarthric, language intact.  CN II-XII intact. Bulk normal.  Tone slightly increased, muscle strength 5/5 throughout.  Sensation to pinprick and vibration intact.  Deep tendon reflexes 2+ throughout except 3+ knees, toes downgoing.  Finger to nose and  heel to shin testing intact.  Gait slightly wide-based.  Romberg negative   , DO  CC: Shon Millet, FNP

## 2020-09-27 ENCOUNTER — Ambulatory Visit
Admission: RE | Admit: 2020-09-27 | Discharge: 2020-09-27 | Disposition: A | Discharge status: Home / Self Care | Payer: 59 | Source: Ambulatory Visit | Attending: Neurology | Admitting: Neurology

## 2020-09-27 ENCOUNTER — Other Ambulatory Visit: Payer: Self-pay

## 2020-09-27 DIAGNOSIS — G35 Multiple sclerosis: Secondary | ICD-10-CM

## 2020-09-27 DIAGNOSIS — M4802 Spinal stenosis, cervical region: Secondary | ICD-10-CM

## 2020-09-27 MED ORDER — GADOBENATE DIMEGLUMINE 529 MG/ML IV SOLN
15.0000 mL | Freq: Once | INTRAVENOUS | Status: AC | PRN
  Administered 2020-09-27: 15 mL via INTRAVENOUS

## 2020-09-29 ENCOUNTER — Encounter: Payer: Self-pay | Admitting: Neurology

## 2020-09-29 ENCOUNTER — Other Ambulatory Visit (INDEPENDENT_AMBULATORY_CARE_PROVIDER_SITE_OTHER): Payer: 59

## 2020-09-29 ENCOUNTER — Ambulatory Visit (INDEPENDENT_AMBULATORY_CARE_PROVIDER_SITE_OTHER): Payer: 59 | Admitting: Neurology

## 2020-09-29 ENCOUNTER — Other Ambulatory Visit: Payer: Self-pay

## 2020-09-29 VITALS — BP 106/70 | HR 83 | Ht 67.0 in | Wt 131.8 lb

## 2020-09-29 DIAGNOSIS — G35 Multiple sclerosis: Secondary | ICD-10-CM | POA: Diagnosis not present

## 2020-09-29 DIAGNOSIS — Q761 Klippel-Feil syndrome: Secondary | ICD-10-CM

## 2020-09-29 DIAGNOSIS — G894 Chronic pain syndrome: Secondary | ICD-10-CM

## 2020-09-29 DIAGNOSIS — R251 Tremor, unspecified: Secondary | ICD-10-CM | POA: Diagnosis not present

## 2020-09-29 DIAGNOSIS — R4189 Other symptoms and signs involving cognitive functions and awareness: Secondary | ICD-10-CM

## 2020-09-29 LAB — VITAMIN D 25 HYDROXY (VIT D DEFICIENCY, FRACTURES): VITD: 30.66 ng/mL (ref 30.00–100.00)

## 2020-09-29 MED ORDER — NORTRIPTYLINE HCL 25 MG PO CAPS: ORAL_CAPSULE | ORAL | 0 refills | Status: DC

## 2020-09-29 MED ORDER — MODAFINIL 100 MG PO TABS: 100.0000 mg | ORAL_TABLET | Freq: Every morning | ORAL | 0 refills | Status: DC

## 2020-09-29 NOTE — Patient Instructions (Signed)
Restart Ocrevus Restart nortriptyline and modafinil Check quantitative immunoglobulin panel and vit D Follow up 6 months.

## 2020-09-30 LAB — IGG, IGA, IGM
IgG (Immunoglobin G), Serum: 1333 mg/dL (ref 600–1640)
IgM, Serum: 39 mg/dL — ABNORMAL LOW (ref 50–300)
Immunoglobulin A: 157 mg/dL (ref 47–310)

## 2020-09-30 NOTE — Progress Notes (Signed)
Ocrevus order form filled out and faxed.

## 2020-10-01 ENCOUNTER — Other Ambulatory Visit: Payer: Self-pay

## 2020-10-01 DIAGNOSIS — G894 Chronic pain syndrome: Secondary | ICD-10-CM

## 2020-10-01 DIAGNOSIS — Q761 Klippel-Feil syndrome: Secondary | ICD-10-CM

## 2020-10-01 DIAGNOSIS — G35 Multiple sclerosis: Secondary | ICD-10-CM

## 2020-10-01 MED ORDER — NORTRIPTYLINE HCL 25 MG PO CAPS
ORAL_CAPSULE | ORAL | 0 refills | Status: DC
Start: 2020-10-01 — End: 2021-04-08

## 2020-10-02 IMAGING — MR MR HEAD WO/W CM
13 series · 48 of 48 positions shown · IV contrast (multihance)
Comparison: Brain and cervical spine MRI 02/13/2019

CLINICAL DATA: Multiple sclerosis

EXAM:
MRI HEAD WITHOUT AND WITH CONTRAST
MRI CERVICAL SPINE WITHOUT AND WITH CONTRAST
TECHNIQUE: Multiplanar, multiecho pulse sequences of the brain and surrounding
structures, and cervical spine, to include the craniocervical
junction and cervicothoracic junction, were obtained without and
with intravenous contrast.
CONTRAST:  13mL MULTIHANCE GADOBENATE DIMEGLUMINE 529 MG/ML IV SOLN

[Series 3: t1_se_sag · sagittal · 5.0mm · 0.47mm/px · 1 of 24 slices shown]
[im 1/24]
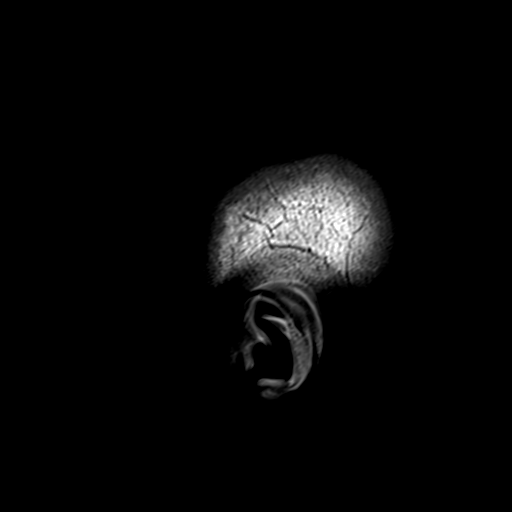

[Series 4: ep2d_diff_3 · axial · 3.0mm · 1.88mm/px · z∈[-59,+100]mm · 5 of 106 slices shown]
[im 1/106]
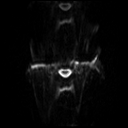
[im 27/106]
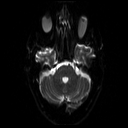
[im 53/106]
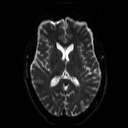
[im 79/106]
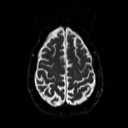
[im 106/106]
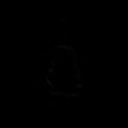

[Series 5: ep2d_diff_3_adc · axial · 3.0mm · 1.88mm/px · z∈[-59,+100]mm · 3 of 48 slices shown]
[im 1/48]
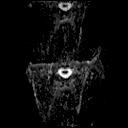
[im 24/48]
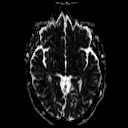
[im 48/48]
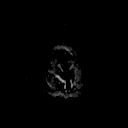

[Series 6: ep2d_diff_cor · coronal · 5.0mm · 1.77mm/px · 4 of 64 slices shown]
[im 1/64]
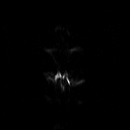
[im 22/64]
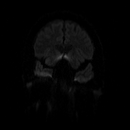
[im 43/64]
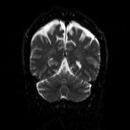
[im 64/64]
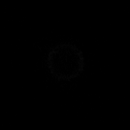

[Series 7: ep2d_diff_cor_adc · coronal · 5.0mm · 1.77mm/px · 2 of 32 slices shown]
[im 1/32]
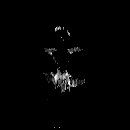
[im 32/32]
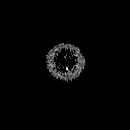

[Series 9: swi_images · axial · 2.0mm · 0.94mm/px · z∈[-61,+97]mm · 5 of 80 slices shown]
[im 1/80]
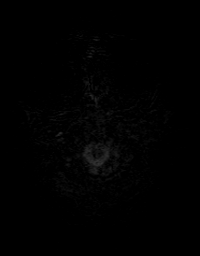
[im 20/80]
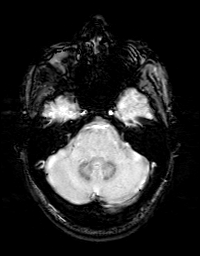
[im 40/80]
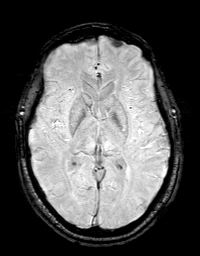
[im 60/80]
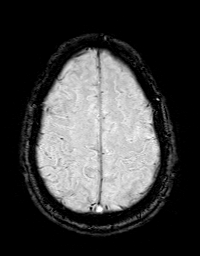
[im 80/80]
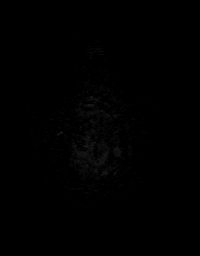

[Series 10: FLAIR · axial · 3.0mm · 0.47mm/px · z∈[-66,+102]mm · 2 of 29 slices shown (1 of 2)]
[im 1/29]
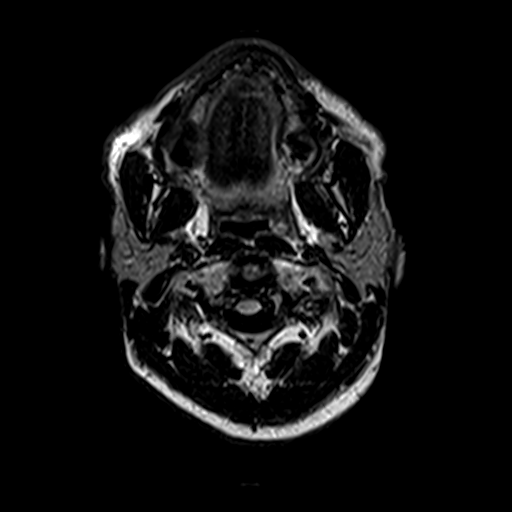
[im 29/29]
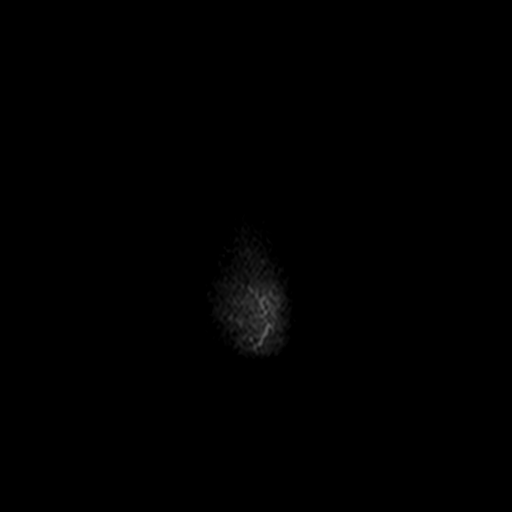

[Series 11: t2_tse_tra_512 · axial · 5.0mm · 0.62mm/px · z∈[-63,+99]mm · 2 of 28 slices shown]
[im 1/28]
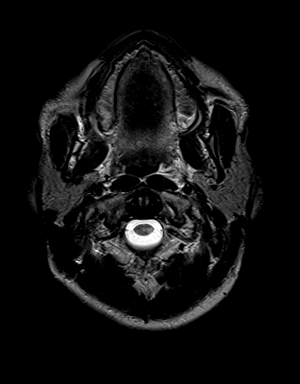
[im 28/28]
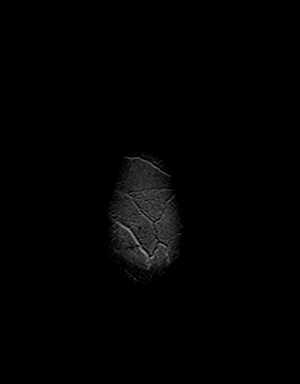

[Series 12: t1_mpr_tra · axial · 1.0mm · 0.75mm/px · z∈[-62,+97]mm · 9 of 160 slices shown]
[im 1/160]
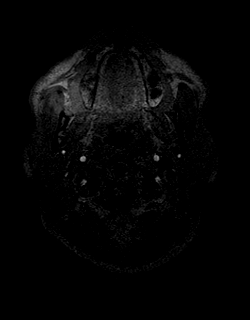
[im 20/160]
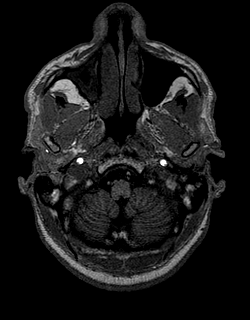
[im 40/160]
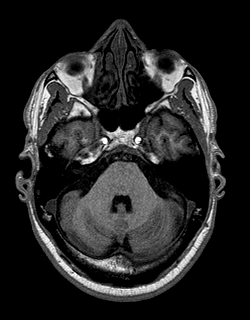
[im 60/160]
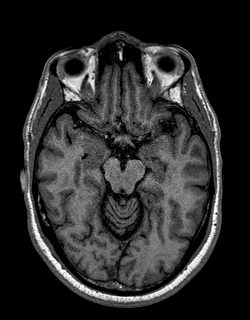
[im 80/160]
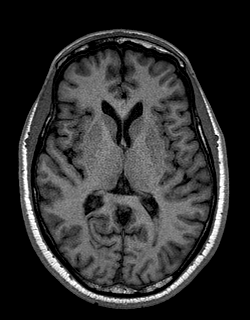
[im 100/160]
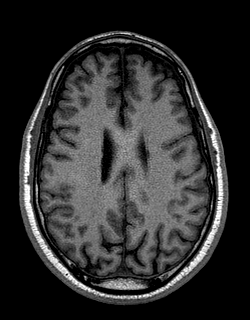
[im 120/160]
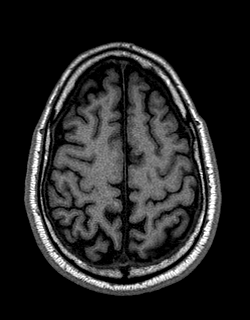
[im 140/160]
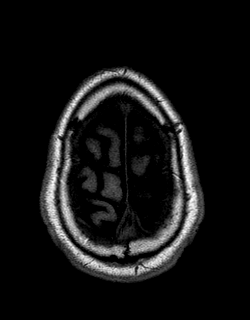
[im 160/160]
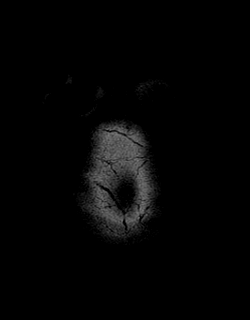

[Series 13: T2 · coronal · 5.0mm · 0.45mm/px · 2 of 32 slices shown]
[im 1/32]
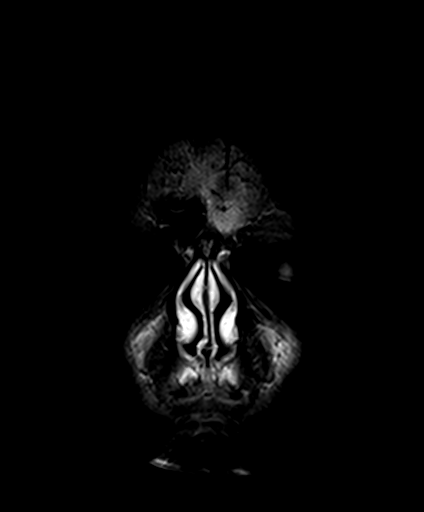
[im 32/32]
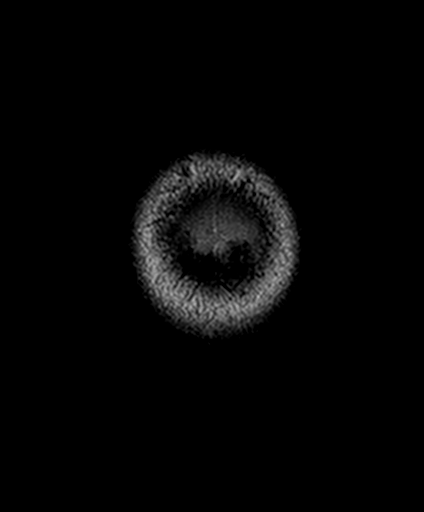

[Series 14: FLAIR · sagittal · 5.0mm · 0.47mm/px · 2 of 29 slices shown (2 of 2)]
[im 1/29]
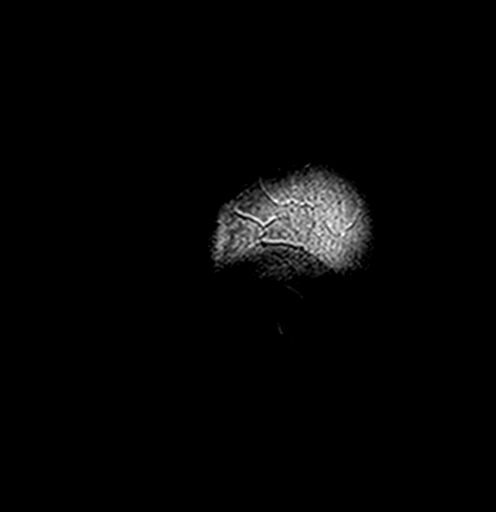
[im 29/29]
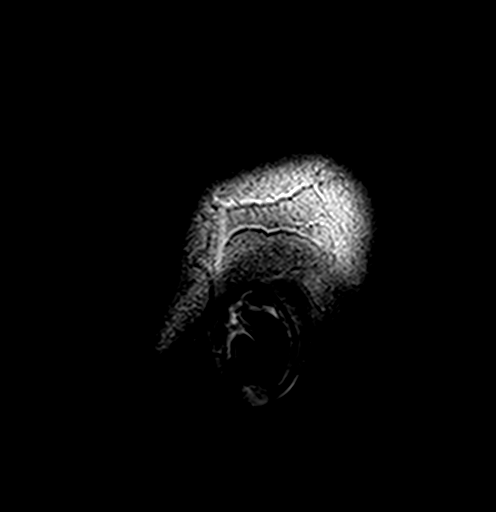

[Series 16: T1 post-contrast · coronal · 5.0mm · 0.72mm/px · 2 of 32 slices shown]
[im 1/32]
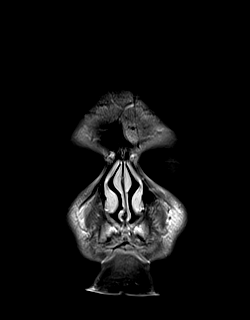
[im 32/32]
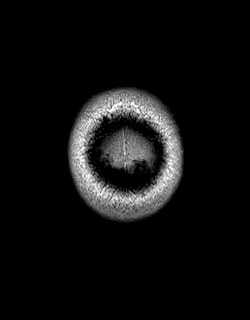

[Series 17: post t1_mpr_tra · axial · 1.0mm · 0.75mm/px · z∈[-62,+97]mm · 9 of 160 slices shown]
[im 1/160]
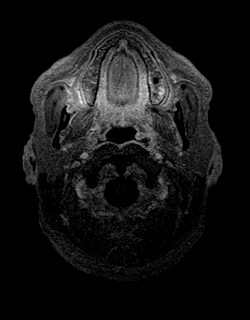
[im 20/160]
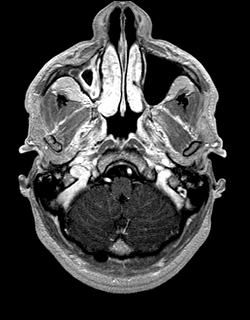
[im 40/160]
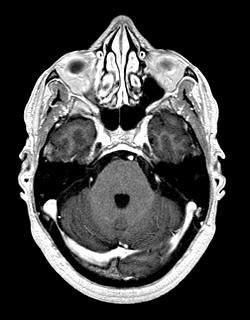
[im 60/160]
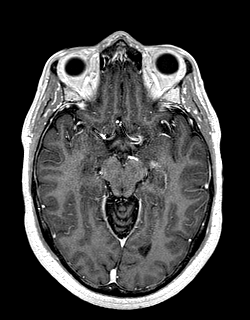
[im 80/160]
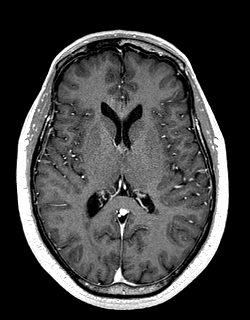
[im 100/160]
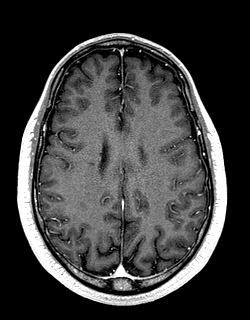
[im 120/160]
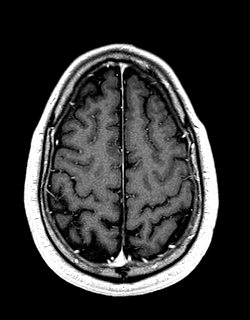
[im 140/160]
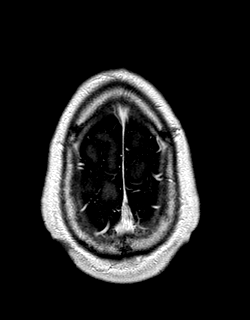
[im 160/160]
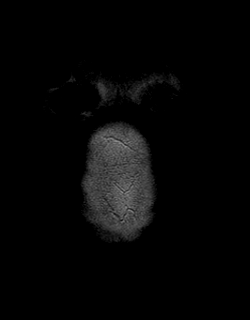

[48 of 48 positions shown; findings below may reference images not displayed]

FINDINGS: MRI HEAD FINDINGS

Brain: No acute infarct, acute hemorrhage or extra-axial collection.
Unchanged distribution of predominantly right-sided white matter
lesions numbering 5-10. No infratentorial lesions. Normal volume of
CSF spaces. No chronic microhemorrhage. Normal midline structures.
There is no abnormal contrast enhancement.

Vascular: Normal flow voids.

Skull and upper cervical spine: Normal marrow signal.

Sinuses/Orbits: Unchanged right maxillary sinus opacification.
Normal orbits.

Other: None

MRI CERVICAL SPINE FINDINGS

Alignment: Physiologic.

Vertebrae: C5-7 ACDF

Cord: Unchanged lesions at C2, C5. there is a lesion at T2 that was
below the field of view of the prior study.

Posterior Fossa, vertebral arteries, paraspinal tissues: Negative.

Disc levels:

C3-4: Small central disc protrusion narrowing the ventral thecal sac
is unchanged from the prior study. No spinal canal or neural
foraminal stenosis.

There is no spinal canal stenosis at the fused C5-7 levels.

The other intervertebral disc levels are unremarkable.
IMPRESSION: 1. Unchanged distribution of white matter and spinal cord lesions in
a pattern consistent with multiple sclerosis.
2. No active demyelination.

## 2020-10-02 IMAGING — MR MR CERVICAL SPINE WO/W CM
6 of 9 series · 32 of 48 positions shown · IV contrast (multihance)
Comparison: Brain and cervical spine MRI 02/13/2019

CLINICAL DATA: Multiple sclerosis

EXAM:
MRI HEAD WITHOUT AND WITH CONTRAST
MRI CERVICAL SPINE WITHOUT AND WITH CONTRAST
TECHNIQUE: Multiplanar, multiecho pulse sequences of the brain and surrounding
structures, and cervical spine, to include the craniocervical
junction and cervicothoracic junction, were obtained without and
with intravenous contrast.
CONTRAST:  13mL MULTIHANCE GADOBENATE DIMEGLUMINE 529 MG/ML IV SOLN

[Series 2: t2_tse_sag · sagittal · 3.0mm · 0.90mm/px · 4 of 15 slices shown]
[im 1/15]
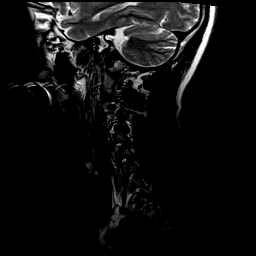
[im 5/15]
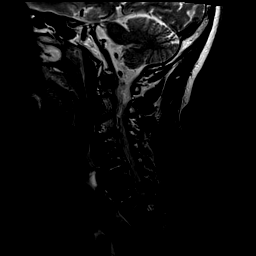
[im 10/15]
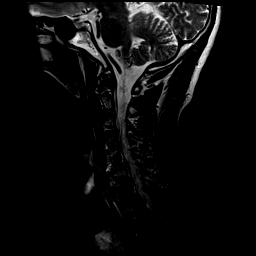
[im 15/15]
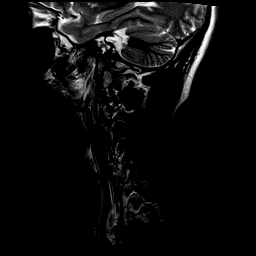

[Series 3: t1_tse_sag · sagittal · 3.0mm · 0.90mm/px · 4 of 15 slices shown]
[im 1/15]
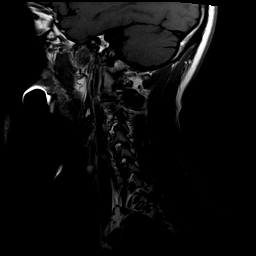
[im 5/15]
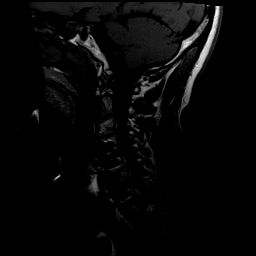
[im 10/15]
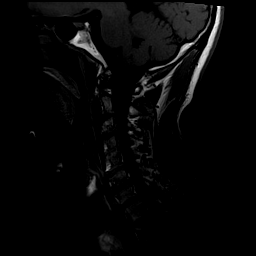
[im 15/15]
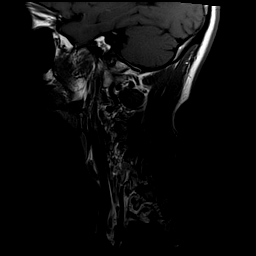

[Series 4: t2_stir sag · sagittal · 3.0mm · 0.86mm/px · 4 of 15 slices shown]
[im 1/15]
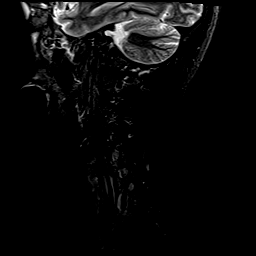
[im 5/15]
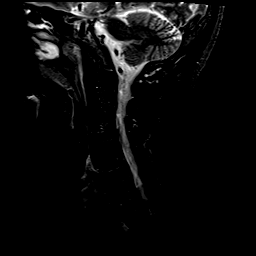
[im 10/15]
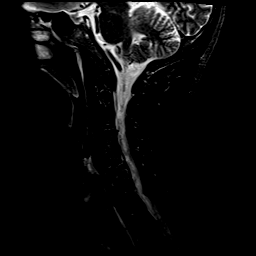
[im 15/15]
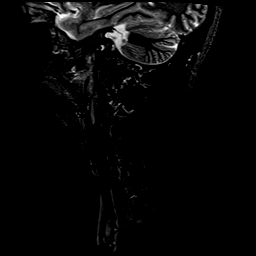

[Series 5: t2_tse_ax · axial · 3.0mm · 0.78mm/px · z∈[-230,-121]mm · 7 of 30 slices shown]
[im 1/30]
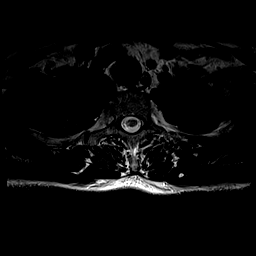
[im 5/30]
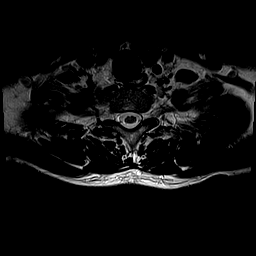
[im 10/30]
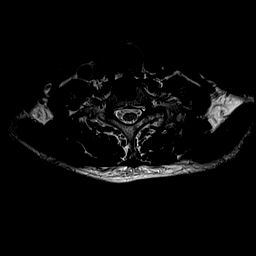
[im 15/30]
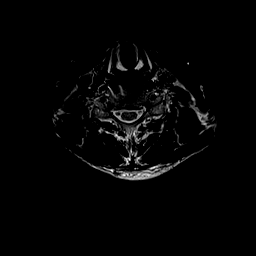
[im 20/30]
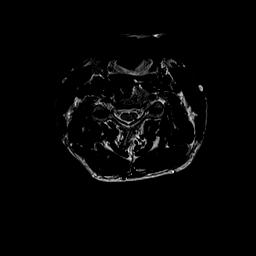
[im 25/30]
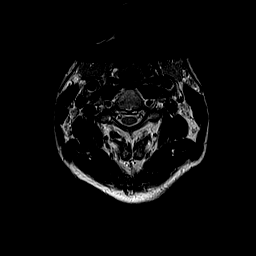
[im 30/30]
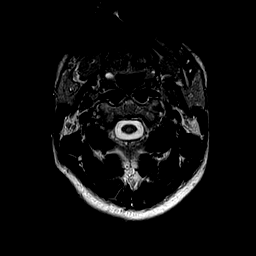

[Series 6: GRE · axial · 3.0mm · 0.35mm/px · z∈[-226,-136]mm · 6 of 30 slices shown]
[im 1/30]
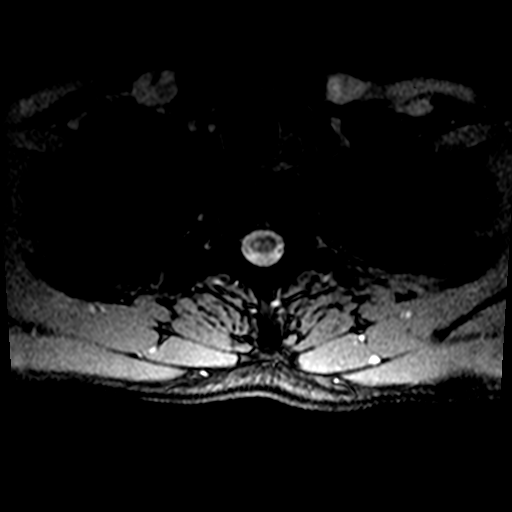
[im 5/30]
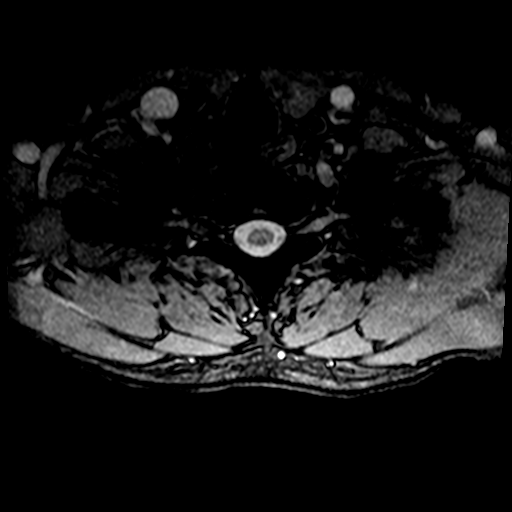
[im 10/30]
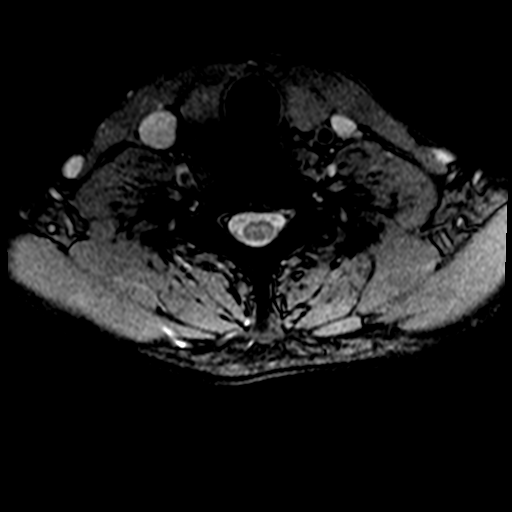
[im 15/30]
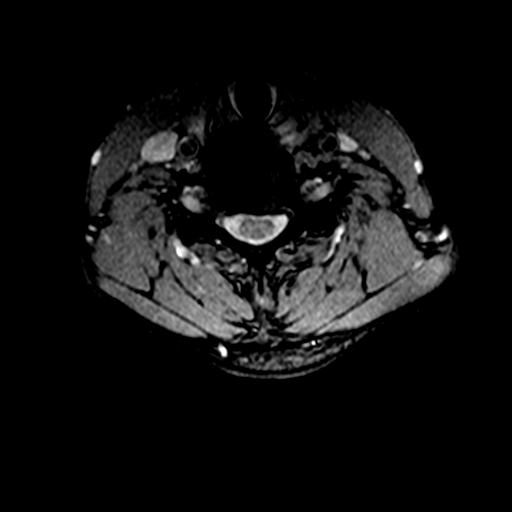
[im 20/30]
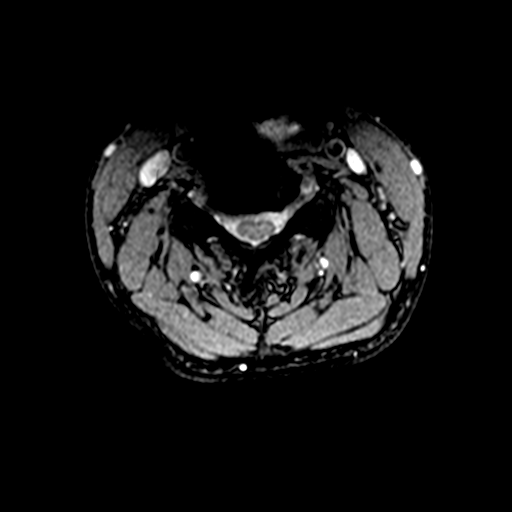
[im 25/30]
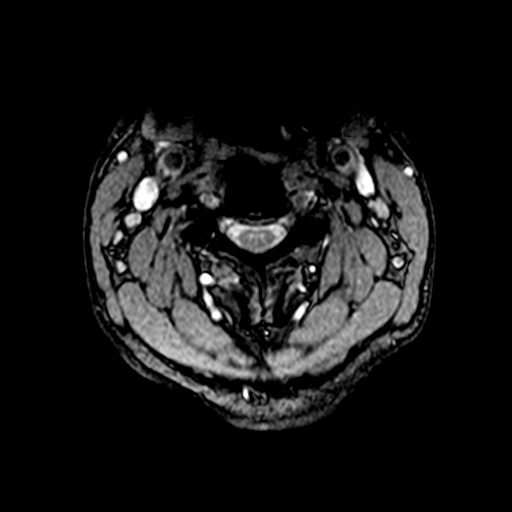

[Series 7: T1 · axial · 3.0mm · 0.35mm/px · z∈[-226,-117]mm · 7 of 30 slices shown]
[im 1/30]
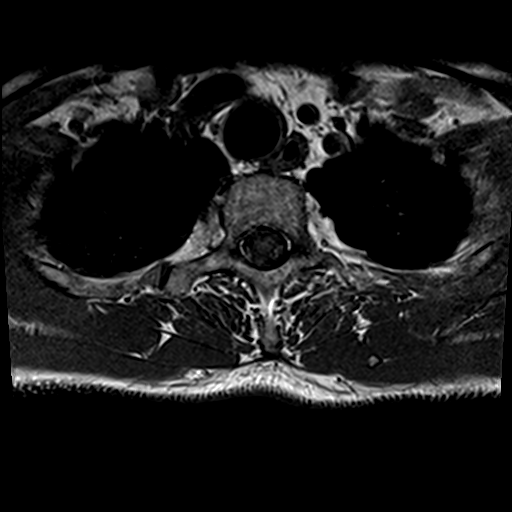
[im 5/30]
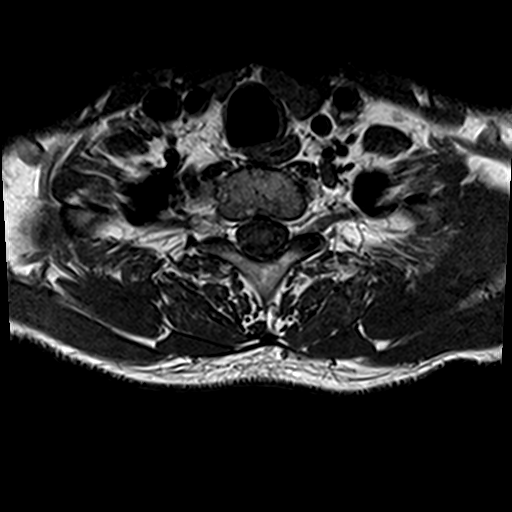
[im 10/30]
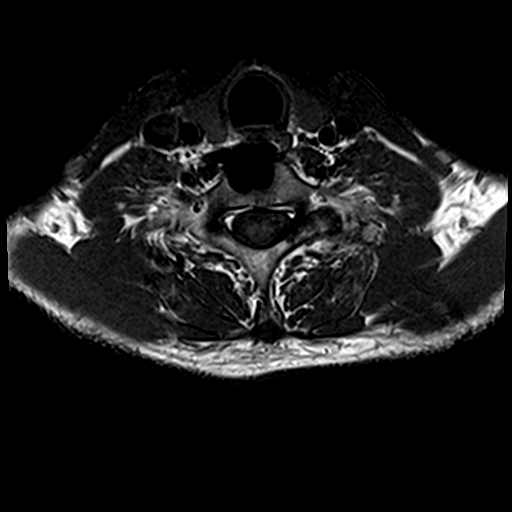
[im 15/30]
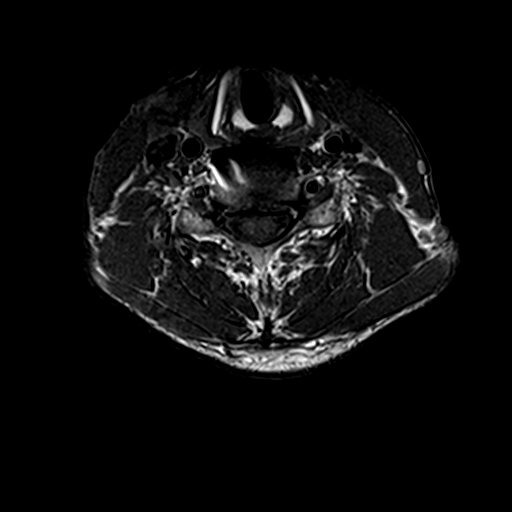
[im 20/30]
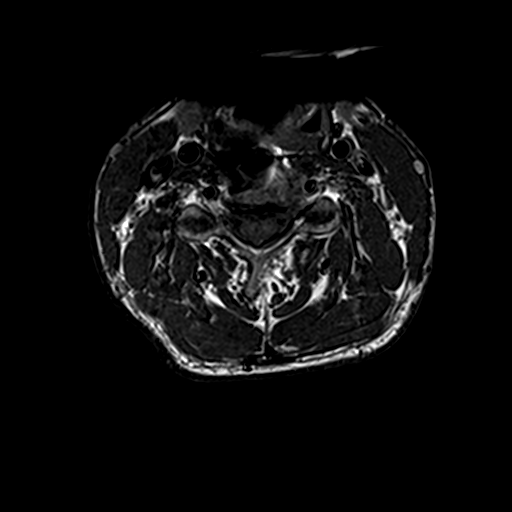
[im 25/30]
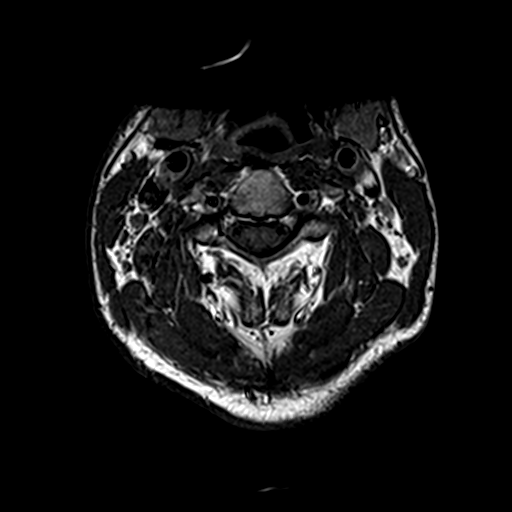
[im 30/30]
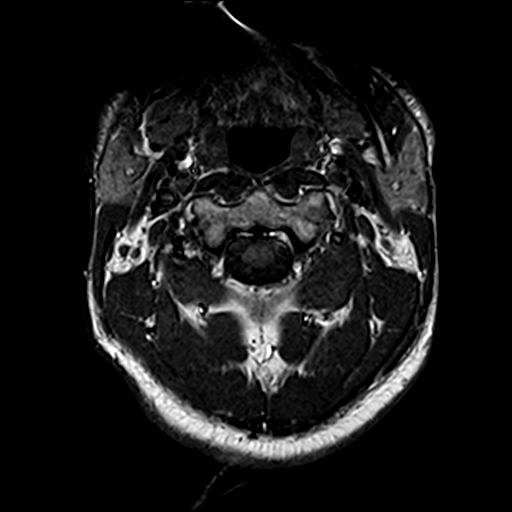

[32 of 48 positions shown; findings below may reference images not displayed]

FINDINGS: MRI HEAD FINDINGS

Brain: No acute infarct, acute hemorrhage or extra-axial collection.
Unchanged distribution of predominantly right-sided white matter
lesions numbering 5-10. No infratentorial lesions. Normal volume of
CSF spaces. No chronic microhemorrhage. Normal midline structures.
There is no abnormal contrast enhancement.

Vascular: Normal flow voids.

Skull and upper cervical spine: Normal marrow signal.

Sinuses/Orbits: Unchanged right maxillary sinus opacification.
Normal orbits.

Other: None

MRI CERVICAL SPINE FINDINGS

Alignment: Physiologic.

Vertebrae: C5-7 ACDF

Cord: Unchanged lesions at C2, C5. there is a lesion at T2 that was
below the field of view of the prior study.

Posterior Fossa, vertebral arteries, paraspinal tissues: Negative.

Disc levels:

C3-4: Small central disc protrusion narrowing the ventral thecal sac
is unchanged from the prior study. No spinal canal or neural
foraminal stenosis.

There is no spinal canal stenosis at the fused C5-7 levels.

The other intervertebral disc levels are unremarkable.
IMPRESSION: 1. Unchanged distribution of white matter and spinal cord lesions in
a pattern consistent with multiple sclerosis.
2. No active demyelination.

## 2020-10-03 ENCOUNTER — Ambulatory Visit
Admission: RE | Admit: 2020-10-03 | Discharge: 2020-10-03 | Disposition: A | Discharge status: Home / Self Care | Payer: 59 | Source: Ambulatory Visit | Attending: Neurosurgery | Admitting: Neurosurgery

## 2020-10-03 ENCOUNTER — Other Ambulatory Visit: Payer: Self-pay

## 2020-10-03 DIAGNOSIS — Z981 Arthrodesis status: Secondary | ICD-10-CM | POA: Insufficient documentation

## 2020-10-15 ENCOUNTER — Inpatient Hospital Stay: Payer: 59 | Attending: Internal Medicine

## 2020-10-15 NOTE — Progress Notes (Signed)
Nutrition  Patient did not show up for scheduled nutrition follow-up appointment.  Message sent to scheduling to offer another appointment.   Harold Mattes B. Freida Busman, RD, LDN Registered Dietitian [phone] (mobile)

## 2020-11-11 ENCOUNTER — Ambulatory Visit
Payer: 59 | Attending: Student in an Organized Health Care Education/Training Program | Admitting: Student in an Organized Health Care Education/Training Program

## 2020-11-11 ENCOUNTER — Encounter: Payer: Self-pay | Admitting: Student in an Organized Health Care Education/Training Program

## 2020-11-11 ENCOUNTER — Other Ambulatory Visit: Payer: Self-pay

## 2020-11-11 VITALS — BP 100/72 | HR 61 | Temp 97.4°F | Resp 15 | Ht 67.0 in | Wt 129.6 lb

## 2020-11-11 DIAGNOSIS — M47812 Spondylosis without myelopathy or radiculopathy, cervical region: Secondary | ICD-10-CM | POA: Insufficient documentation

## 2020-11-11 DIAGNOSIS — G35 Multiple sclerosis: Secondary | ICD-10-CM | POA: Insufficient documentation

## 2020-11-11 DIAGNOSIS — Q761 Klippel-Feil syndrome: Secondary | ICD-10-CM | POA: Insufficient documentation

## 2020-11-11 DIAGNOSIS — G894 Chronic pain syndrome: Secondary | ICD-10-CM | POA: Insufficient documentation

## 2020-11-11 DIAGNOSIS — F1290 Cannabis use, unspecified, uncomplicated: Secondary | ICD-10-CM | POA: Insufficient documentation

## 2020-11-11 MED ORDER — TIZANIDINE HCL 4 MG PO TABS: 4.0000 mg | ORAL_TABLET | Freq: Three times a day (TID) | ORAL | 0 refills | Status: AC | PRN

## 2020-11-11 NOTE — Progress Notes (Signed)
PROVIDER NOTE: Information contained herein reflects review and annotations entered in association with encounter. Interpretation of such information and data should be left to medically-trained personnel. Information provided to patient can be located elsewhere in the medical record under "Patient Instructions". Document created using STT-dictation technology, any transcriptional errors that may result from process are unintentional.    Patient: Joshua [name]  Service Category: E/M  Provider: Gillis Santa, MD  DOB: Feb 18, 1970  DOS: 11/11/2020  Specialty: Interventional Pain Management  MRN: [id number]  Setting: Ambulatory outpatient  PCP: Remi Haggard, FNP  Type: Established Patient    Referring Provider: Doyle Askew, MD  Location: Office  Delivery: Face-to-face     HPI  [name], a 50 y.o. year old male, is here today because of his Cervical facet joint syndrome [M47.812]. 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 Location: Neck Posterior/to mid back and down both arms to fingers; LEFT side worse; shooting pains to hands; fingers become numb. Onset: More than a month ago. Quality: Shooting, Numbness, Stabbing. Timing: Constant. Modifying factor(s): nothing helps. Vitals:  height is 5' 7" (1.702 m) and weight is 129 lb 9.6 oz (58.8 kg). His temporal temperature is 97.4 F (36.3 C) (abnormal). His blood pressure is 100/72 and his pulse is 61. His respiration is 15 and oxygen saturation is 100%.   Reason for encounter:   Patient's last visit with me was 09/05/2019 after his second cervical epidural steroid injection which unfortunately resulted in increased pain.  Patient follows up today as a  referral from Dr. Alba Destine to consider cervical facet joint injection with sedation as he was unable to tolerate this without sedation.  He has also been evaluated by neurosurgery and cervical spinal cord stimulator trial was discussed.  I informed the patient that I do not do cervical stim trials and that he should discuss further with Dr. Lacinda Axon.  Patient's wife was present in the room with him today as well.  She also confirmed that he had increased pain after his last cervical ESI.  We discussed trialing diagnostic cervical facet medial branch nerve blocks and if effective can further discuss RFA.  ROS  Constitutional: Denies any fever or chills Gastrointestinal: No reported hemesis, hematochezia, vomiting, or acute GI distress Musculoskeletal: Denies any acute onset joint swelling, redness, loss of ROM, or weakness Neurological:  Numbness tingling upper extremity  Medication Review  acetaminophen, modafinil, nortriptyline, and tiZANidine  History Review  Allergy: Joshua [name] is allergic to penicillins. Drug: Joshua [name]  reports current drug use. Frequency: 7.00 times per week. Drug: Marijuana. Alcohol:  reports current alcohol use. Tobacco:  reports that he has been smoking cigarettes. He has a 20.00 pack-year smoking history. He has never used smokeless tobacco. Social: Joshua [name]  reports that he has been smoking cigarettes. He has a 20.00 pack-year smoking history. He has never used smokeless tobacco. He reports current alcohol use. He reports current drug use. Frequency: 7.00 times per week. Drug: Marijuana. Medical:  has a past medical history of Allergy, Anxiety, Depression, Multiple sclerosis ([place]), Neck pain, and Neuromuscular disorder ([place]). Surgical: Joshua [name]  has a past surgical history that includes Spinal fusion; Neck surgery (2014); Cranioplasty (2005); and Anterior cervical decomp/discectomy fusion (N/A, 02/20/2020).  Family: family history includes Aneurysm in his  brother; Hypertension in his father and mother; Multiple sclerosis in his sister.  Laboratory Chemistry Profile   Renal Lab Results  Component Value Date   BUN 10 09/04/2020   CREATININE 0.86 09/38/1829   BCR NOT APPLICABLE 93/71/6967   GFRAA >60 04/12/2019   GFRNONAA >60 09/04/2020    Hepatic Lab Results  Component Value Date   AST 28 09/04/2020   ALT 18 09/04/2020   ALBUMIN 4.5 09/04/2020   ALKPHOS 56 09/04/2020    Electrolytes Lab Results  Component Value Date   NA 133 (L) 09/04/2020   K 3.1 (L) 09/04/2020   CL 101 09/04/2020   CALCIUM 8.9 09/04/2020    Bone Lab Results  Component Value Date   VD25OH 30.66 09/29/2020    Inflammation (CRP: Acute Phase) (ESR: Chronic Phase) No results found for: CRP, ESRSEDRATE, LATICACIDVEN       Note: Above Lab results reviewed.  Recent Imaging Review  CT CERVICAL SPINE WO CONTRAST CLINICAL DATA:  Severe anterior neck pain with limited range of motion since cervical fusion approximately 8 months ago. No recent injury.  EXAM: CT CERVICAL SPINE WITHOUT CONTRAST  TECHNIQUE: Multidetector CT imaging of the cervical spine was performed without intravenous contrast. Multiplanar CT image reconstructions were also generated.  COMPARISON:  Cervical spine CT 09/26/2019.  MRI 09/27/2020.  FINDINGS: Alignment: Stable minimal convex right scoliosis. The lateral alignment is normal.  Skull base and vertebrae: Since the previous CT, the patient has undergone revision of the cervical fusion. The fusion has been extended superiorly across the C3-4 disc space with interval placement of an anterior plate and interbody spacer at C3-4. The C4-5 plate has been removed. Solid interbody fusion is not definitely demonstrated at C3-4, but there is no evidence of hardware loosening. There is solid interbody fusion at C4-5. Status post C6-7 ACDF without interval change. No acute osseous findings.  Soft tissues and spinal canal: Mildly  limited by beam hardening artifact from the surgical hardware. No prevertebral fluid or swelling. No visible canal hematoma.  Disc levels:  C2-3: Minimal uncinate spurring.  No spinal stenosis.  C3-4: Interval ACDF without demonstrated complication. No hardware loosening. No definite solid interbody fusion. No foraminal narrowing.  C4-5: Stable uncinate spurring on the left contributing to mild left foraminal narrowing post ACDF.  C5-6: Stable chronic spondylosis with posterior osteophytes and uncinate spurring. Stable mild chronic foraminal narrowing bilaterally.  C6-7: Stable mild residual uncinate spurring post ACDF. Stable mild foraminal narrowing bilaterally.  C7-T1: Asymmetric facet hypertrophy on the left. No significant spinal stenosis.  Upper chest: Severe paraseptal emphysematous changes at both lung apices.  Other: Bilateral carotid atherosclerosis.  IMPRESSION: 1. Compared with previous CT of 1 year ago, interval ACDF at C3-4. The hardware is intact without loosening, although solid interbody fusion is not definitely demonstrated. 2. Otherwise stable postsurgical changes with solid interbody fusion at C4-5 and C6-7. Stable mild foraminal narrowing due to uncinate spurring as detailed above. 3. No acute findings identified.  Electronically Signed   By: Richardean Sale M.D.   On: 10/05/2020 11:31 Note: Reviewed        Physical Exam  General appearance: Well nourished, well developed, and well hydrated. In no apparent acute distress Mental status: Alert, oriented x 3 (person, place, & time)       Respiratory: No evidence of acute respiratory distress Eyes: PERLA Vitals: BP 100/72   Pulse 61   Temp (!) 97.4 F (36.3 C) (Temporal)  Resp 15   Ht 5' 7" (1.702 m)   Wt 129 lb 9.6 oz (58.8 kg)   SpO2 100%   BMI 20.30 kg/m  BMI: Estimated body mass index is 20.3 kg/m as calculated from the following:   Height as of this encounter: 5' 7" (1.702 m).    Weight as of this encounter: 129 lb 9.6 oz (58.8 kg). Ideal: Ideal body weight: 66.1 kg (145 lb 11.6 oz)    Cervical Spine Area Exam  Skin & Axial Inspection: Well healed scar from previous spine surgery detected Alignment: Symmetrical Functional ROM: Restricted Cervical ROM     Pain with cervical extension Stability: No instability detected Muscle Tone/Strength: Functionally intact. No obvious neuro-muscular anomalies detected. Sensory (Neurological): Unimpaired Palpation: No palpable anomalies             Positive Spurling's on the left       Upper Extremity (UE) Exam      Side: Right upper extremity   Side: Left upper extremity   Skin & Extremity Inspection: Skin color, temperature, and hair growth are WNL. No peripheral edema or cyanosis. No masses, redness, swelling, asymmetry, or associated skin lesions. No contractures.   Skin & Extremity Inspection: Skin color, temperature, and hair growth are WNL. No peripheral edema or cyanosis. No masses, redness, swelling, asymmetry, or associated skin lesions. No contractures.   Functional ROM: Restricted cervical ROM           Functional ROM: Unrestricted ROM           Muscle Tone/Strength: Functionally intact. No obvious neuro-muscular anomalies detected.   Muscle Tone/Strength: Functionally intact. No obvious neuro-muscular anomalies detected.   Sensory (Neurological): Unimpaired           Sensory (Neurological): Unimpaired           Palpation: No palpable anomalies               Palpation: No palpable anomalies               Provocative Test(s):  Phalen's test: deferred Tinel's test: deferred Apley's scratch test (touch opposite shoulder):  Action 1 (Across chest): Adequate ROM Action 2 (Overhead): Adequate ROM Action 3 (LB reach): Adequate ROM     Provocative Test(s):  Phalen's test: deferred Tinel's test: deferred Apley's scratch test (touch opposite shoulder):  Action 1 (Across chest): Adequate ROM Action 2 (Overhead): Adequate  ROM Action 3 (LB reach): Adequate ROM     5 out of 5 strength bilateral upper extremity: Shoulder abduction, elbow flexion, elbow extension, thumb extension.  Assessment   Status Diagnosis  Persistent Controlled Controlled 1. Cervical facet joint syndrome   2. Cervical fusion syndrome (C4-C7)   3. Marijuana use   4. MS (multiple sclerosis) ([place])   5. Chronic pain syndrome      Updated Problems: Problem  Cervical Facet Joint Syndrome  Chronic Pain Syndrome  Cervical Radicular Pain  Cervical fusion syndrome (C4-C7)  Cocaine Abuse ([facility])  Ms (Multiple Sclerosis) ([facility])  Marijuana Use    Plan of Care   Joshua [name] has a current medication list which includes the following long-term medication(s): modafinil and nortriptyline.  Joshua [name] has a history of greater than 3 months of moderate to severe cervical spine pain which has resulted in functional impairment.  The patient has tried various conservative therapeutic options such as NSAIDs, Tylenol, muscle relaxants, physical therapy which was inadequately effective.  He has tried and failed cervical epidural steroid injection.  He is being referred from Dr. Alba Destine to consider cervical facet medial branch nerve blocks with sedation as he was unable to tolerate these without.  Cervical facet medial branch nerve blocks were discussed with the patient.  Risks and benefits were reviewed.  Patient would like to proceed with bilateral cervical facets medial branch nerve block.   Pharmacotherapy (Medications Ordered): Meds ordered this encounter  Medications   tiZANidine (ZANAFLEX) 4 MG tablet    Sig: Take 1 tablet (4 mg total) by mouth every 8 (eight) hours as needed for muscle spasms.    Dispense:  90 tablet    Refill:  0    Do not place this medication, or any other prescription from our practice, on "Automatic Refill". Patient may have prescription filled one day early if pharmacy is closed on scheduled refill date.    Orders:  Orders Placed This Encounter  Procedures   CERVICAL FACET (MEDIAL BRANCH NERVE BLOCK)     Standing Status:   Future    Standing Expiration Date:   12/12/2020    Scheduling Instructions:     Side: Bilateral     Level: C4-5, C5-6 Facet joints ( C4, C5, C6, Medial Branch Nerves)     Sedation: IV sedation     Timeframe: As soon as schedule allows    Order Specific Question:   Where will this procedure be performed?    Answer:   [facility]   Follow-up plan:   Return in about 1 week (around 11/18/2020) for B/L C4, 5, 6 Fct Block #1 , moderate sedation (ECT suite).        Recent Visits No visits were found meeting these conditions. Showing recent visits within past 90 days and meeting all other requirements Today's Visits Date Type Provider Dept  11/11/20 Office Visit Gillis Santa, MD [facility]  Showing today's visits and meeting all other requirements Future Appointments No visits were found meeting these conditions. Showing future appointments within next 90 days and meeting all other requirements I discussed the assessment and treatment plan with the patient. The patient was provided an opportunity to ask questions and all were answered. The patient agreed with the plan and demonstrated an understanding of the instructions.  Patient advised to call back or seek an in-person evaluation if the symptoms or condition worsens.  Duration of encounter: 46mnutes.  Note by: BGillis Santa MD Date: 11/11/2020; Time: 2:04 PM

## 2020-11-11 NOTE — Patient Instructions (Signed)
Moderate Conscious Sedation, Adult Sedation is the use of medicines to promote relaxation and to relieve discomfort and anxiety. Moderate conscious sedation is a type of sedation. Under moderate conscious sedation, you are less alert than normal, but you arestill able to respond to instructions, touch, or both. Moderate conscious sedation is used during short medical and dental procedures. It is milder than deep sedation, which is a type of sedation under which you cannot be easily woken up. It is also milder than general anesthesia, which is the use of medicines to make you unconscious. Moderate conscious sedationallows you to return to your regular activities sooner. Tell a health care provider about: Any allergies you have. All medicines you are taking, including vitamins, herbs, eye drops, creams, and over-the-counter medicines. Any use of steroids. This includes steroids taken by mouth or as a cream. Any problems you or family members have had with sedatives and anesthetic medicines. Any blood disorders you have. Any surgeries you have had. Any medical conditions you have, such as sleep apnea. Whether you are pregnant or may be pregnant. Any use of cigarettes, alcohol, marijuana, or drugs. What are the risks? Generally, this is a safe procedure. However, problems may occur, including: Getting too much medicine (oversedation). Nausea. Allergic reaction to medicines. Trouble breathing. If this happens, a breathing tube may be used. It will be removed when you are awake and breathing on your own. Heart trouble. Lung trouble. Confusion that gets better with time (emergence delirium). What happens before the procedure? Staying hydrated Follow instructions from your health care provider about hydration, which may include: Up to 2 hours before the procedure - you may continue to drink clear liquids, such as water, clear fruit juice, black coffee, and plain tea. Eating and drinking  restrictions Follow instructions from your health care provider about eating and drinking, which may include: 8 hours before the procedure - stop eating heavy meals or foods, such as meat, fried foods, or fatty foods. 6 hours before the procedure - stop eating light meals or foods, such as toast or cereal. 6 hours before the procedure - stop drinking milk or drinks that contain milk. 2 hours before the procedure - stop drinking clear liquids. Medicines Ask your health care provider about: Changing or stopping your regular medicines. This is especially important if you are taking diabetes medicines or blood thinners. Taking medicines such as aspirin and ibuprofen. These medicines can thin your blood. Do not take these medicines unless your health care provider tells you to take them. Taking over-the-counter medicines, vitamins, herbs, and supplements. Tests and exams You will have a physical exam. You may have blood tests done to show how well: Your kidneys and liver work. Your blood clots. General instructions Plan to have a responsible adult take you home from the hospital or clinic. If you will be going home right after the procedure, plan to have a responsible adult care for you for the time you are told. This is important. What happens during the procedure?  You will be given the sedative. The sedative may be given: As a pill that you will swallow. It can also be inserted into the rectum. As a spray through the nose. As an injection into the muscle. As an injection into the vein through an IV. You may be given oxygen as needed. Your breathing, heart rate, and blood pressure will be monitored during the procedure. The medical or dental procedure will be done. The procedure may vary among health care providers   and hospitals. What happens after the procedure? Your blood pressure, heart rate, breathing rate, and blood oxygen level will be monitored until you leave the hospital or  clinic. You will get fluids through your IV if needed. Do not drive or operate machinery until your health care provider says that it is safe. Summary Sedation is the use of medicines to promote relaxation and to relieve discomfort and anxiety. Moderate conscious sedation is a type of sedation that is used during short medical and dental procedures. Tell the health care provider about any medical conditions that you have and about all the medicines that you are taking. You will be given the sedative as a pill, a spray through the nose, an injection into the muscle, or an injection into the vein through an IV. Vital signs are monitored during the sedation. Moderate conscious sedation allows you to return to your regular activities sooner. This information is not intended to replace advice given to you by your health care provider. Make sure you discuss any questions you have with your healthcare provider. Document Revised: 05/18/2019 Document Reviewed: 12/14/2018 Elsevier Patient Education  2022 [employer] GENERAL RISKS AND COMPLICATIONS  What are the risk, side effects and possible complications? Generally speaking, most procedures are safe.  However, with any procedure there are risks, side effects, and the possibility of complications.  The risks and complications are dependent upon the sites that are lesioned, or the type of nerve block to be performed.  The closer the procedure is to the spine, the more serious the risks are.  Great care is taken when placing the radio frequency needles, block needles or lesioning probes, but sometimes complications can occur. Infection: Any time there is an injection through the skin, there is a risk of infection.  This is why sterile conditions are used for these blocks.  There are four possible types of infection. Localized skin infection. Central Nervous System Infection-This can be in the form of Meningitis, which can be deadly. Epidural  Infections-This can be in the form of an epidural abscess, which can cause pressure inside of the spine, causing compression of the spinal cord with subsequent paralysis. This would require an emergency surgery to decompress, and there are no guarantees that the patient would recover from the paralysis. Discitis-This is an infection of the intervertebral discs.  It occurs in about 1% of discography procedures.  It is difficult to treat and it may lead to surgery.        2. Pain: the needles have to go through skin and soft tissues, will cause soreness.       3. Damage to internal structures:  The nerves to be lesioned may be near blood vessels or    other nerves which can be potentially damaged.       4. Bleeding: Bleeding is more common if the patient is taking blood thinners such as  aspirin, Coumadin, Ticiid, Plavix, etc., or if he/she have some genetic predisposition  such as hemophilia. Bleeding into the spinal canal can cause compression of the spinal  cord with subsequent paralysis.  This would require an emergency surgery to  decompress and there are no guarantees that the patient would recover from the  paralysis.       5. Pneumothorax:  Puncturing of a lung is a possibility, every time a needle is introduced in  the area of the chest or upper back.  Pneumothorax refers to free air around the  collapsed lung(s), inside of the   thoracic cavity (chest cavity).  Another two possible  complications related to a similar event would include: Hemothorax and Chylothorax.   These are variations of the Pneumothorax, where instead of air around the collapsed  lung(s), you may have blood or chyle, respectively.       6. Spinal headaches: They may occur with any procedures in the area of the spine.       7. Persistent CSF (Cerebro-Spinal Fluid) leakage: This is a rare problem, but may occur  with prolonged intrathecal or epidural catheters either due to the formation of a fistulous  track or a dural tear.        8. Nerve damage: By working so close to the spinal cord, there is always a possibility of  nerve damage, which could be as serious as a permanent spinal cord injury with  paralysis.       9. Death:  Although rare, severe deadly allergic reactions known as "Anaphylactic  reaction" can occur to any of the medications used.      10. Worsening of the symptoms:  We can always make thing worse.  What are the chances of something like this happening? Chances of any of this occuring are extremely low.  By statistics, you have more of a chance of getting killed in a motor vehicle accident: while driving to the hospital than any of the above occurring .  Nevertheless, you should be aware that they are possibilities.  In general, it is similar to taking a shower.  Everybody knows that you can slip, hit your head and get killed.  Does that mean that you should not shower again?  Nevertheless always keep in mind that statistics do not mean anything if you happen to be on the wrong side of them.  Even if a procedure has a 1 (one) in a 1,000,000 (million) chance of going wrong, it you happen to be that one..Also, keep in mind that by statistics, you have more of a chance of having something go wrong when taking medications.  Who should not have this procedure? If you are on a blood thinning medication (e.g. Coumadin, Plavix, see list of "Blood Thinners"), or if you have an active infection going on, you should not have the procedure.  If you are taking any blood thinners, please inform your physician.  How should I prepare for this procedure? Do not eat or drink anything at least six hours prior to the procedure. Bring a driver with you .  It cannot be a taxi. Come accompanied by an adult that can drive you back, and that is strong enough to help you if your legs get weak or numb from the local anesthetic. Take all of your medicines the morning of the procedure with just enough water to swallow them. If you have  diabetes, make sure that you are scheduled to have your procedure done first thing in the morning, whenever possible. If you have diabetes, take only half of your insulin dose and notify our nurse that you have done so as soon as you arrive at the clinic. If you are diabetic, but only take blood sugar pills (oral hypoglycemic), then do not take them on the morning of your procedure.  You may take them after you have had the procedure. Do not take aspirin or any aspirin-containing medications, at least eleven (11) days prior to the procedure.  They may prolong bleeding. Wear loose fitting clothing that may be easy to take off and that   you would not mind if it got stained with Betadine or blood. Do not wear any jewelry or perfume Remove any nail coloring.  It will interfere with some of our monitoring equipment.  NOTE: Remember that this is not meant to be interpreted as a complete list of all possible complications.  Unforeseen problems may occur.  BLOOD THINNERS The following drugs contain aspirin or other products, which can cause increased bleeding during surgery and should not be taken for 2 weeks prior to and 1 week after surgery.  If you should need take something for relief of minor pain, you may take acetaminophen which is found in Tylenol,m Datril, Anacin-3 and Panadol. It is not blood thinner. The products listed below are.  Do not take any of the products listed below in addition to any listed on your instruction sheet.  A.P.C or A.P.C with Codeine Codeine Phosphate Capsules #3 Ibuprofen Ridaura  ABC compound Congesprin Imuran rimadil  Advil Cope Indocin Robaxisal  Alka-Seltzer Effervescent Pain Reliever and Antacid Coricidin or Coricidin-D  Indomethacin Rufen  Alka-Seltzer plus Cold Medicine Cosprin Ketoprofen S-A-C Tablets  Anacin Analgesic Tablets or Capsules Coumadin Korlgesic Salflex  Anacin Extra Strength Analgesic tablets or capsules CP-2 Tablets Lanoril Salicylate  Anaprox  Cuprimine Capsules Levenox Salocol  Anexsia-D Dalteparin Magan Salsalate  Anodynos Darvon compound Magnesium Salicylate Sine-off  Ansaid Dasin Capsules Magsal Sodium Salicylate  Anturane Depen Capsules Marnal Soma  APF Arthritis pain formula Dewitt's Pills Measurin Stanback  Argesic Dia-Gesic Meclofenamic Sulfinpyrazone  Arthritis Bayer Timed Release Aspirin Diclofenac Meclomen Sulindac  Arthritis pain formula Anacin Dicumarol Medipren Supac  Analgesic (Safety coated) Arthralgen Diffunasal Mefanamic Suprofen  Arthritis Strength Bufferin Dihydrocodeine Mepro Compound Suprol  Arthropan liquid Dopirydamole Methcarbomol with Aspirin Synalgos  ASA tablets/Enseals Disalcid Micrainin Tagament  Ascriptin Doan's Midol Talwin  Ascriptin A/D Dolene Mobidin Tanderil  Ascriptin Extra Strength Dolobid Moblgesic Ticlid  Ascriptin with Codeine Doloprin or Doloprin with Codeine Momentum Tolectin  Asperbuf Duoprin Mono-gesic Trendar  Aspergum Duradyne Motrin or Motrin IB Triminicin  Aspirin plain, buffered or enteric coated Durasal Myochrisine Trigesic  Aspirin Suppositories Easprin Nalfon Trillsate  Aspirin with Codeine Ecotrin Regular or Extra Strength Naprosyn Uracel  Atromid-S Efficin Naproxen Ursinus  Auranofin Capsules Elmiron Neocylate Vanquish  Axotal Emagrin Norgesic Verin  Azathioprine Empirin or Empirin with Codeine Normiflo Vitamin E  Azolid Emprazil Nuprin Voltaren  Bayer Aspirin plain, buffered or children's or timed BC Tablets or powders Encaprin Orgaran Warfarin Sodium  Buff-a-Comp Enoxaparin Orudis Zorpin  Buff-a-Comp with Codeine Equegesic Os-Cal-Gesic   Buffaprin Excedrin plain, buffered or Extra Strength Oxalid   Bufferin Arthritis Strength Feldene Oxphenbutazone   Bufferin plain or Extra Strength Feldene Capsules Oxycodone with Aspirin   Bufferin with Codeine Fenoprofen Fenoprofen Pabalate or Pabalate-SF   Buffets II Flogesic Panagesic   Buffinol plain or Extra Strength Florinal  or Florinal with Codeine Panwarfarin   Buf-Tabs Flurbiprofen Penicillamine   Butalbital Compound Four-way cold tablets Penicillin   Butazolidin Fragmin Pepto-Bismol   Carbenicillin Geminisyn Percodan   Carna Arthritis Reliever Geopen Persantine   Carprofen Gold's salt Persistin   Chloramphenicol Goody's Phenylbutazone   Chloromycetin Haltrain Piroxlcam   Clmetidine heparin Plaquenil   Cllnoril Hyco-pap Ponstel   Clofibrate Hydroxy chloroquine Propoxyphen         Before stopping any of these medications, be sure to consult the physician who ordered them.  Some, such as Coumadin (Warfarin) are ordered to prevent or treat serious conditions such as "deep thrombosis", "pumonary embolisms", and other heart problems.    The amount of time that you may need off of the medication may also vary with the medication and the reason for which you were taking it.  If you are taking any of these medications, please make sure you notify your pain physician before you undergo any procedures.         Facet Blocks Patient Information  Description: The facets are joints in the spine between the vertebrae.  Like any joints in the body, facets can become irritated and painful.  Arthritis can also effect the facets.  By injecting steroids and local anesthetic in and around these joints, we can temporarily block the nerve supply to them.  Steroids act directly on irritated nerves and tissues to reduce selling and inflammation which often leads to decreased pain.  Facet blocks may be done anywhere along the spine from the neck to the low back depending upon the location of your pain.   After numbing the skin with local anesthetic (like Novocaine), a small needle is passed onto the facet joints under x-ray guidance.  You may experience a sensation of pressure while this is being done.  The entire block usually lasts about 15-25 minutes.   Conditions which may be treated by facet blocks:  Low back/buttock  pain Neck/shoulder pain Certain types of headaches  Preparation for the injection:  Do not eat any solid food or dairy products within 8 hours of your appointment. You may drink clear liquid up to 3 hours before appointment.  Clear liquids include water, black coffee, juice or soda.  No milk or cream please. You may take your regular medication, including pain medications, with a sip of water before your appointment.  Diabetics should hold regular insulin (if taken separately) and take 1/2 normal NPH dose the morning of the procedure.  Carry some sugar containing items with you to your appointment. A driver must accompany you and be prepared to drive you home after your procedure. Bring all your current medications with you. An IV may be inserted and sedation may be given at the discretion of the physician. A blood pressure cuff, EKG and other monitors will often be applied during the procedure.  Some patients may need to have extra oxygen administered for a short period. You will be asked to provide medical information, including your allergies and medications, prior to the procedure.  We must know immediately if you are taking blood thinners (like Coumadin/Warfarin) or if you are allergic to IV iodine contrast (dye).  We must know if you could possible be pregnant.  Possible side-effects:  Bleeding from needle site Infection (rare, may require surgery) Nerve injury (rare) Numbness & tingling (temporary) Difficulty urinating (rare, temporary) Spinal headache (a headache worse with upright posture) Light-headedness (temporary) Pain at injection site (serveral days) Decreased blood pressure (rare, temporary) Weakness in arm/leg (temporary) Pressure sensation in back/neck (temporary)   Call if you experience:  Fever/chills associated with headache or increased back/neck pain Headache worsened by an upright position New onset, weakness or numbness of an extremity below the injection  site Hives or difficulty breathing (go to the emergency room) Inflammation or drainage at the injection site(s) Severe back/neck pain greater than usual New symptoms which are concerning to you  Please note:  Although the local anesthetic injected can often make your back or neck feel good for several hours after the injection, the pain will likely return. It takes 3-7 days for steroids to work.  You may not notice any pain relief for   at least one week.  If effective, we will often do a series of 2-3 injections spaced 3-6 weeks apart to maximally decrease your pain.  After the initial series, you may be a candidate for a more permanent nerve block of the facets.  If you have any questions, please call [phone] [facility] 

## 2020-11-11 NOTE — Progress Notes (Signed)
Safety precautions to be maintained throughout the outpatient stay will include: orient to surroundings, keep bed in low position, maintain call bell within reach at all times, provide assistance with transfer out of bed and ambulation.  

## 2020-11-19 ENCOUNTER — Other Ambulatory Visit: Payer: Self-pay

## 2020-11-19 DIAGNOSIS — D508 Other iron deficiency anemias: Secondary | ICD-10-CM

## 2020-11-19 DIAGNOSIS — G35 Multiple sclerosis: Secondary | ICD-10-CM

## 2020-11-21 ENCOUNTER — Inpatient Hospital Stay (HOSPITAL_BASED_OUTPATIENT_CLINIC_OR_DEPARTMENT_OTHER): Payer: 59 | Admitting: Internal Medicine

## 2020-11-21 ENCOUNTER — Other Ambulatory Visit: Payer: Self-pay

## 2020-11-21 ENCOUNTER — Inpatient Hospital Stay: Payer: 59

## 2020-11-21 ENCOUNTER — Inpatient Hospital Stay: Payer: 59 | Attending: Internal Medicine

## 2020-11-21 DIAGNOSIS — R634 Abnormal weight loss: Secondary | ICD-10-CM | POA: Diagnosis not present

## 2020-11-21 DIAGNOSIS — D509 Iron deficiency anemia, unspecified: Secondary | ICD-10-CM | POA: Insufficient documentation

## 2020-11-21 DIAGNOSIS — G35 Multiple sclerosis: Secondary | ICD-10-CM | POA: Diagnosis not present

## 2020-11-21 DIAGNOSIS — R630 Anorexia: Secondary | ICD-10-CM | POA: Diagnosis not present

## 2020-11-21 DIAGNOSIS — D649 Anemia, unspecified: Secondary | ICD-10-CM | POA: Diagnosis not present

## 2020-11-21 DIAGNOSIS — D508 Other iron deficiency anemias: Secondary | ICD-10-CM

## 2020-11-21 LAB — COMPREHENSIVE METABOLIC PANEL
ALT: 13 U/L (ref 0–44)
AST: 20 U/L (ref 15–41)
Albumin: 4.1 g/dL (ref 3.5–5.0)
Alkaline Phosphatase: 56 U/L (ref 38–126)
Anion gap: 7 (ref 5–15)
BUN: 11 mg/dL (ref 6–20)
CO2: 25 mmol/L (ref 22–32)
Calcium: 8.7 mg/dL — ABNORMAL LOW (ref 8.9–10.3)
Chloride: 102 mmol/L (ref 98–111)
Creatinine, Ser: 0.92 mg/dL (ref 0.61–1.24)
GFR, Estimated: >60 mL/min (ref >60)
Glucose, Bld: 105 mg/dL — ABNORMAL HIGH (ref 70–99)
Potassium: 4 mmol/L (ref 3.5–5.1)
Sodium: 134 mmol/L — ABNORMAL LOW (ref 135–145)
Total Bilirubin: 0.6 mg/dL (ref 0.3–1.2)
Total Protein: 7.4 g/dL (ref 6.5–8.1)

## 2020-11-21 LAB — CBC WITH DIFFERENTIAL/PLATELET
Abs Immature Granulocytes: 0.01 10*3/uL (ref 0.00–0.07)
Basophils Absolute: 0 10*3/uL (ref 0.0–0.1)
Basophils Relative: 1 %
Eosinophils Absolute: 0.2 10*3/uL (ref 0.0–0.5)
Eosinophils Relative: 3 %
HCT: 39.6 % (ref 39.0–52.0)
Hemoglobin: 13.9 g/dL (ref 13.0–17.0)
Immature Granulocytes: 0 %
Lymphocytes Relative: 24 %
Lymphs Abs: 1.4 10*3/uL (ref 0.7–4.0)
MCH: 30.2 pg (ref 26.0–34.0)
MCHC: 35.1 g/dL (ref 30.0–36.0)
MCV: 85.9 fL (ref 80.0–100.0)
Monocytes Absolute: 0.4 10*3/uL (ref 0.1–1.0)
Monocytes Relative: 7 %
Neutro Abs: 3.9 10*3/uL (ref 1.7–7.7)
Neutrophils Relative %: 65 %
Platelets: 194 10*3/uL (ref 150–400)
RBC: 4.61 MIL/uL (ref 4.22–5.81)
RDW: 12.2 % (ref 11.5–15.5)
WBC: 6 10*3/uL (ref 4.0–10.5)
nRBC: 0 % (ref 0.0–0.2)

## 2020-11-21 LAB — FERRITIN: Ferritin: 121 ng/mL (ref 24–336)

## 2020-11-21 LAB — IRON AND TIBC
Iron: 57 ug/dL (ref 45–182)
Saturation Ratios: 20 % (ref 17.9–39.5)
TIBC: 284 ug/dL (ref 250–450)
UIBC: 227 ug/dL

## 2020-11-21 LAB — VITAMIN B12: Vitamin B-12: 335 pg/mL (ref 180–914)

## 2020-11-21 LAB — FOLATE: Folate: 8 ng/mL (ref >5.9)

## 2020-11-21 NOTE — Assessment & Plan Note (Addendum)
#  History of anemia-however more recent hemoglobin is 13.  No evidence of any iron deficiency-iron saturation is 20.  Recommend holding further iron infusions.   # Multiple sclerosis [[place], Neurology]- STABLE.   # Poor apetite- refer to [facility]/nutrition; defer to PCP for further evaluation/management if patient needed any Of them.  #Since patient is clinically stable I think is reasonable for the patient to follow-up with PCP/can follow-up with [place] as needed.  Patient comfortable with the plan; to call us if any questions or concerns in the interim.  # DISPOSITION: # Refer to [facility] Re: weight loss/ loss of apetite # Follow up as needed-Dr.B

## 2020-11-21 NOTE — Progress Notes (Signed)
Pt c/o constant pain and "feeling like skin is crawling" from MS. Pt requesting to see nutritionist about gaining weight. No other concerns at this time.

## 2020-11-21 NOTE — Progress Notes (Signed)
[facility] CONSULT NOTE  Patient Care Team: Armando Gang, FNP as PCP - General (Family Medicine)  CHIEF COMPLAINTS/PURPOSE OF CONSULTATION: Anemia  HISTORY OF PRESENTING ILLNESS: Alone.  Ambulating dependently. Joshua [name] 50 y.o.  male with a history of anemia and multiple sclerosis is here for follow-up.  Patient was recently evaluated in the clinic-diagnosed with iron deficient anemia received iron infusion.  Patient denies any significant improvement of his fatigue.  He continues to complain of poor appetite.  Patient states that he follows up with [facility] neurology for his multiple sclerosis.  Review of Systems  Constitutional:  Positive for malaise/fatigue and weight loss. Negative for chills, diaphoresis and fever.  HENT:  Negative for nosebleeds and sore throat.   Eyes:  Negative for double vision.  Respiratory:  Negative for cough, hemoptysis, sputum production, shortness of breath and wheezing.   Cardiovascular:  Negative for chest pain, palpitations, orthopnea and leg swelling.  Gastrointestinal:  Negative for abdominal pain, blood in stool, constipation, diarrhea, heartburn, melena, nausea and vomiting.  Genitourinary:  Negative for dysuria, frequency and urgency.  Musculoskeletal:  Positive for back pain and joint pain.  Skin: Negative.  Negative for itching and rash.  Neurological:  Negative for dizziness, tingling, focal weakness, weakness and headaches.  Endo/Heme/Allergies:  Does not bruise/bleed easily.  Psychiatric/Behavioral:  Negative for depression. The patient is not nervous/anxious and does not have insomnia.     MEDICAL HISTORY:  Past Medical History:  Diagnosis Date   Allergy    Anxiety    Depression    Multiple sclerosis (HCC)    Neck pain    Neuromuscular disorder (HCC)     SURGICAL HISTORY: Past Surgical History:  Procedure Laterality Date   ANTERIOR CERVICAL DECOMP/DISCECTOMY FUSION N/A 02/20/2020   Procedure:  ANTERIOR CERVICAL DECOMPRESSION/DISCECTOMY FUSION 1 LEVEL C3-4;  Surgeon: Venetia Night, MD;  Location: [facility] ORS;  Service: Neurosurgery;  Laterality: N/A;   CRANIOPLASTY  2005   subdural hematoma  that had to be removed   NECK SURGERY  2014   SPINAL FUSION      SOCIAL HISTORY: Social History   Socioeconomic History   Marital status: Married    Spouse name: angelina   Number of children: 2   Years of education: Not on file   Highest education level: High school graduate  Occupational History   Not on file  Tobacco Use   Smoking status: Every Day    Packs/day: 1.00    Years: 20.00    Pack years: 20.00    Types: Cigarettes   Smokeless tobacco: Never   Tobacco comments:    uses nicotine patch. trying to quit  Vaping Use   Vaping Use: Never used  Substance and Sexual Activity   Alcohol use: Yes    Comment: occasionally   Drug use: Yes    Frequency: 7.0 times per week    Types: Marijuana   Sexual activity: Yes    Birth control/protection: Condom  Other Topics Concern   Not on file  Social History Narrative   Pt is married he lives with his wife   He has 2 children   Left handed also can write with right handed   Drinks coffee every morning, tea sometime, soda not often    Social Determinants of Corporate investment banker Strain: Not on file  Food Insecurity: Not on file  Transportation Needs: Not on file  Physical Activity: Not on file  Stress: Not on file  Social Connections: Not on file  Intimate Partner Violence: Unknown   Fear of Current or Ex-Partner: No   Emotionally Abused: No   Physically Abused: No   Sexually Abused: Not on file    FAMILY HISTORY: Family History  Problem Relation Age of Onset   Hypertension Mother    Hypertension Father    Multiple sclerosis Sister    Aneurysm Brother     ALLERGIES:  is allergic to penicillins.  MEDICATIONS:  Current Outpatient Medications  Medication Sig Dispense Refill   acetaminophen (TYLENOL) 500  MG tablet Take 2 tablets (1,000 mg total) by mouth every 6 (six) hours as needed for mild pain or moderate pain. 30 tablet 0   modafinil (PROVIGIL) 100 MG tablet Take 1 tablet (100 mg total) by mouth in the morning. 30 tablet 0   nortriptyline (PAMELOR) 25 MG capsule Take 1 capsule at bedtime for a week, then increase to 2 capsules at bedtime 60 capsule 0   tiZANidine (ZANAFLEX) 4 MG tablet Take 1 tablet (4 mg total) by mouth every 8 (eight) hours as needed for muscle spasms. (Patient not taking: No sig reported) 90 tablet 0   No current facility-administered medications for this visit.      Marland Kitchen  PHYSICAL EXAMINATION:  Vitals:   11/21/20 1318  BP: 94/77  Pulse: 82  Resp: 18  Temp: 97.9 F (36.6 C)  SpO2: 100%   Filed Weights   11/21/20 1318  Weight: 131 lb 3.2 oz (59.5 kg)    Physical Exam Vitals and nursing note reviewed.  HENT:     Head: Normocephalic and atraumatic.     Mouth/Throat:     Pharynx: Oropharynx is clear.  Eyes:     Extraocular Movements: Extraocular movements intact.     Pupils: Pupils are equal, round, and reactive to light.  Cardiovascular:     Rate and Rhythm: Normal rate and regular rhythm.  Pulmonary:     Comments: Decreased breath sounds bilaterally.  Abdominal:     Palpations: Abdomen is soft.  Musculoskeletal:        General: Normal range of motion.     Cervical back: Normal range of motion.  Skin:    General: Skin is warm.  Neurological:     General: No focal deficit present.     Mental Status: He is alert and oriented to person, place, and time.  Psychiatric:        Behavior: Behavior normal.        Judgment: Judgment normal.     LABORATORY DATA:  I have reviewed the data as listed Lab Results  Component Value Date   WBC 6.0 11/21/2020   HGB 13.9 11/21/2020   HCT 39.6 11/21/2020   MCV 85.9 11/21/2020   PLT 194 11/21/2020   Recent Labs    01/29/20 0956 09/04/20 1951 11/21/20 1254  NA 136 133* 134*  K 3.7 3.1* 4.0  CL 102  101 102  CO2 25 23 25   GLUCOSE 109* 122* 105*  BUN 11 10 11   CREATININE 0.84 0.86 0.92  CALCIUM 9.6 8.9 8.7*  GFRNONAA >60 >60 >60  PROT  --  7.6 7.4  ALBUMIN  --  4.5 4.1  AST  --  28 20  ALT  --  18 13  ALKPHOS  --  56 56  BILITOT  --  0.8 0.6    RADIOGRAPHIC STUDIES: I have personally reviewed the radiological images as listed and agreed with the findings in the report. No  results found.  Symptomatic anemia #History of anemia-however more recent hemoglobin is 13.  No evidence of any iron deficiency-iron saturation is 20.  Recommend holding further iron infusions.   # Multiple sclerosis [[place], Neurology]- STABLE.   # Poor apetite- refer to [facility]/nutrition; defer to PCP for further evaluation/management if patient needed any Of them.  #Since patient is clinically stable I think is reasonable for the patient to follow-up with PCP/can follow-up with [place] as needed.  Patient comfortable with the plan; to call us if any questions or concerns in the interim.  # DISPOSITION: # Refer to [facility] Re: weight loss/ loss of apetite # Follow up as needed-Dr.B    All questions were answered. The patient knows to call the clinic with any problems, questions or concerns.       Earna Coder, MD 11/21/2020 4:50 PM

## 2020-11-24 ENCOUNTER — Ambulatory Visit
Admission: RE | Admit: 2020-11-24 | Discharge: 2020-11-24 | Disposition: A | Discharge status: Home / Self Care | Payer: 59 | Source: Ambulatory Visit | Attending: Student in an Organized Health Care Education/Training Program | Admitting: Student in an Organized Health Care Education/Training Program

## 2020-11-24 ENCOUNTER — Encounter: Payer: Self-pay | Admitting: Student in an Organized Health Care Education/Training Program

## 2020-11-24 ENCOUNTER — Ambulatory Visit
Payer: 59 | Attending: Student in an Organized Health Care Education/Training Program | Admitting: Student in an Organized Health Care Education/Training Program

## 2020-11-24 ENCOUNTER — Other Ambulatory Visit: Payer: Self-pay

## 2020-11-24 DIAGNOSIS — G894 Chronic pain syndrome: Secondary | ICD-10-CM | POA: Diagnosis not present

## 2020-11-24 DIAGNOSIS — M47812 Spondylosis without myelopathy or radiculopathy, cervical region: Secondary | ICD-10-CM

## 2020-11-24 DIAGNOSIS — M47892 Other spondylosis, cervical region: Secondary | ICD-10-CM | POA: Insufficient documentation

## 2020-11-24 MED ORDER — ROPIVACAINE HCL 2 MG/ML IJ SOLN
9.0000 mL | Freq: Once | INTRAMUSCULAR | Status: AC
  Administered 2020-11-24: 9 mL via PERINEURAL

## 2020-11-24 MED ORDER — DEXAMETHASONE SODIUM PHOSPHATE 10 MG/ML IJ SOLN
10.0000 mg | Freq: Once | INTRAMUSCULAR | Status: AC
  Administered 2020-11-24: 10 mg
  Filled 2020-11-24: qty 1

## 2020-11-24 MED ORDER — MIDAZOLAM HCL 5 MG/5ML IJ SOLN
0.5000 mg | Freq: Once | INTRAMUSCULAR | Status: AC
  Administered 2020-11-24: 2 mg via INTRAVENOUS
  Filled 2020-11-24: qty 5

## 2020-11-24 MED ORDER — LIDOCAINE HCL 2 % IJ SOLN
20.0000 mL | Freq: Once | INTRAMUSCULAR | Status: AC
  Administered 2020-11-24: 200 mg
  Filled 2020-11-24: qty 20

## 2020-11-24 MED ORDER — LACTATED RINGERS IV SOLN
1000.0000 mL | Freq: Once | INTRAVENOUS | Status: AC
  Administered 2020-11-24: 1000 mL via INTRAVENOUS

## 2020-11-24 NOTE — Progress Notes (Signed)
PROVIDER NOTE: Information contained herein reflects review and annotations entered in association with encounter. Interpretation of such information and data should be left to medically-trained personnel. Information provided to patient can be located elsewhere in the medical record under "Patient Instructions". Document created using STT-dictation technology, any transcriptional errors that may result from process are unintentional.    Patient: Joshua [name]  Service Category: Procedure Provider: Edward Jolly, MD DOB: [birth year] DOS: 11/24/2020 Location: [facility] MRN: [id number] Setting: Ambulatory - outpatient Referring Provider: Edward Jolly, MD Type: Established Patient Specialty: Interventional Pain Management PCP: Armando Gang, FNP  Primary Reason for Visit: Interventional Pain Management Treatment. CC: Neck Pain    Procedure:          Anesthesia, Analgesia, Anxiolysis:  Type: Cervical Facet Medial Branch Block(s)           Primary Purpose: Diagnostic Region: Posterolateral cervical spine Level:  C5, C6, Medial Branch Level(s). Injecting these levels blocks the C5-6 cervical facet joints. Laterality: Left  Anesthesia: Local (1-2% Lidocaine)  Anxiolysis: IV 2 mg of IV Versed Sedation: Minimal  Guidance: Fluoroscopy           Position: Prone with head of the table raised to facilitate breathing.   Indications: 1. Cervical facet joint syndrome   2. Chronic pain syndrome    Pain Score: Pre-procedure: 7 /10 Post-procedure: 6 /10     Pre-op H&P Assessment:  [name] is a 50 y.o. (year old), male patient, seen today for interventional treatment. He  has a past surgical history that includes Spinal fusion; Neck surgery (2014); Cranioplasty (2005); and Anterior cervical decomp/discectomy fusion (N/A, 02/20/2020). [name] has a current medication list which includes the following prescription(s): acetaminophen, modafinil, nortriptyline, and  tizanidine. His primarily concern today is the Neck Pain  Initial Vital Signs:  Pulse/HCG Rate: 64ECG Heart Rate: 69 Temp:  (!) 97.3 F (36.3 C) Resp: 16 BP: 98/69 SpO2: 100 %  BMI: Estimated body mass index is 20.36 kg/m as calculated from the following:   Height as of this encounter: 5\' 7"  (1.702 m).   Weight as of this encounter: 130 lb (59 kg).  Risk Assessment: Allergies: Reviewed. He is allergic to penicillins.  Allergy Precautions: None required Coagulopathies: Reviewed. None identified.  Blood-thinner therapy: None at this time Active Infection(s): Reviewed. None identified. [name] is afebrile  Site Confirmation: [name] was asked to confirm the procedure and laterality before marking the site Procedure checklist: Completed Consent: Before the procedure and under the influence of no sedative(s), amnesic(s), or anxiolytics, the patient was informed of the treatment options, risks and possible complications. To fulfill our ethical and legal obligations, as recommended by the American Medical Association's Code of Ethics, I have informed the patient of my clinical impression; the nature and purpose of the treatment or procedure; the risks, benefits, and possible complications of the intervention; the alternatives, including doing nothing; the risk(s) and benefit(s) of the alternative treatment(s) or procedure(s); and the risk(s) and benefit(s) of doing nothing. The patient was provided information about the general risks and possible complications associated with the procedure. These may include, but are not limited to: failure to achieve desired goals, infection, bleeding, organ or nerve damage, allergic reactions, paralysis, and death. In addition, the patient was informed of those risks and complications associated to Spine-related procedures, such as failure to decrease pain; infection (i.e.: Meningitis, epidural or intraspinal abscess); bleeding (i.e.: epidural  hematoma, subarachnoid hemorrhage, or any other type of intraspinal or  peri-dural bleeding); organ or nerve damage (i.e.: Any type of peripheral nerve, nerve root, or spinal cord injury) with subsequent damage to sensory, motor, and/or autonomic systems, resulting in permanent pain, numbness, and/or weakness of one or several areas of the body; allergic reactions; (i.e.: anaphylactic reaction); and/or death. Furthermore, the patient was informed of those risks and complications associated with the medications. These include, but are not limited to: allergic reactions (i.e.: anaphylactic or anaphylactoid reaction(s)); adrenal axis suppression; blood sugar elevation that in diabetics may result in ketoacidosis or comma; water retention that in patients with history of congestive heart failure may result in shortness of breath, pulmonary edema, and decompensation with resultant heart failure; weight gain; swelling or edema; medication-induced neural toxicity; particulate matter embolism and blood vessel occlusion with resultant organ, and/or nervous system infarction; and/or aseptic necrosis of one or more joints. Finally, the patient was informed that Medicine is not an exact science; therefore, there is also the possibility of unforeseen or unpredictable risks and/or possible complications that may result in a catastrophic outcome. The patient indicated having understood very clearly. We have given the patient no guarantees and we have made no promises. Enough time was given to the patient to ask questions, all of which were answered to the patient's satisfaction. [name] has indicated that he wanted to continue with the procedure. Attestation: I, the ordering provider, attest that I have discussed with the patient the benefits, risks, side-effects, alternatives, likelihood of achieving goals, and potential problems during recovery for the procedure that I have provided informed consent. Date  Time:  11/24/2020  9:23 AM  Pre-Procedure Preparation:  Monitoring: As per clinic protocol. Respiration, ETCO2, SpO2, BP, heart rate and rhythm monitor placed and checked for adequate function Safety Precautions: Patient was assessed for positional comfort and pressure points before starting the procedure. Time-out: I initiated and conducted the "Time-out" before starting the procedure, as per protocol. The patient was asked to participate by confirming the accuracy of the "Time Out" information. Verification of the correct person, site, and procedure were performed and confirmed by me, the nursing staff, and the patient. "Time-out" conducted as per Joint Commission's Universal Protocol (UP.01.01.01). Time: 1017  Description of Procedure:          Laterality: Left Level: C5, C6, Medial Branch Level(s). Area Prepped: Posterior Cervico-thoracic Region DuraPrep (Iodine Povacrylex [0.7% available iodine] and Isopropyl Alcohol, 74% w/w) Safety Precautions: Aspiration looking for blood return was conducted prior to all injections. At no point did we inject any substances, as a needle was being advanced. Before injecting, the patient was told to immediately notify me if he was experiencing any new onset of "ringing in the ears, or metallic taste in the mouth". No attempts were made at seeking any paresthesias. Safe injection practices and needle disposal techniques used. Medications properly checked for expiration dates. SDV (single dose vial) medications used. After the completion of the procedure, all disposable equipment used was discarded in the proper designated medical waste containers. Local Anesthesia: Protocol guidelines were followed. The patient was positioned over the fluoroscopy table. The area was prepped in the usual manner. The time-out was completed. The target area was identified using fluoroscopy. A 12-in long, straight, sterile hemostat was used with fluoroscopic guidance to locate the targets  for each level blocked. Once located, the skin was marked with an approved surgical skin marker. Once all sites were marked, the skin (epidermis, dermis, and hypodermis), as well as deeper tissues (fat, connective tissue and muscle) were  infiltrated with a small amount of a short-acting local anesthetic, loaded on a 10cc syringe with a 25G, 1.5-in  Needle. An appropriate amount of time was allowed for local anesthetics to take effect before proceeding to the next step. Local Anesthetic: Lidocaine 2.0% The unused portion of the local anesthetic was discarded in the proper designated containers. Technical explanation of process:   C5 Medial Branch Nerve Block (MBB): The target area for the C5 dorsal medial articular branch is the lateral concave waist of the articular pillar of C5. Under fluoroscopic guidance, a Quincke needle was inserted until contact was made with os over the postero-lateral aspect of the articular pillar of C5 (target area). After negative aspiration for blood, 1.3mL of the nerve block solution was injected without difficulty or complication. The needle was removed intact. C6 Medial Branch Nerve Block (MBB): The target area for the C6 dorsal medial articular branch is the lateral concave waist of the articular pillar of C6. Under fluoroscopic guidance, a Quincke needle was inserted until contact was made with os over the postero-lateral aspect of the articular pillar of C6 (target area). After negative aspiration for blood, 1.57mL of the nerve block solution was injected without difficulty or complication. The needle was removed intact. Procedural Needles: 22-gauge, 3.5-inch, Quincke needles used for all levels. Nerve block solution:   6 cc solution made of 4 cc of 0.2% ropivacaine, 2 cc of Decadron 10 mg/cc.  1.5 cc injected at each level above on the left.  Of note, patient was unable to tolerate the procedure for the right side.  He has significant hypersensitivity and allodynia even  with skin local anesthetic and after receiving 2 mg of IV Versed for anxiolytic management.  The unused portion of the solution was discarded in the proper designated containers.  Once the entire procedure was completed, the treated area was cleaned, making sure to leave some of the prepping solution back to take advantage of its long term bactericidal properties.  Anatomy Reference Guide:       Vitals:   11/24/20 1020 11/24/20 1025 11/24/20 1029 11/24/20 1038  BP: 98/69 96/71 96/76  102/69  Pulse:      Resp: 18 16 18 16   Temp:      TempSrc:      SpO2: 100% 100% 100% 100%  Weight:      Height:        Start Time: 1017 hrs. End Time: 1028 hrs.  Imaging Guidance (Spinal):          Type of Imaging Technique: Fluoroscopy Guidance (Spinal) Indication(s): Assistance in needle guidance and placement for procedures requiring needle placement in or near specific anatomical locations not easily accessible without such assistance. Exposure Time: Please see nurses notes. Contrast: None used. Fluoroscopic Guidance: I was personally present during the use of fluoroscopy. "Tunnel Vision Technique" used to obtain the best possible view of the target area. Parallax error corrected before commencing the procedure. "Direction-depth-direction" technique used to introduce the needle under continuous pulsed fluoroscopy. Once target was reached, antero-posterior, oblique, and lateral fluoroscopic projection used confirm needle placement in all planes. Images permanently stored in EMR. Interpretation: No contrast injected. I personally interpreted the imaging intraoperatively. Adequate needle placement confirmed in multiple planes. Permanent images saved into the patient's record.  Post-operative Assessment:  Post-procedure Vital Signs:  Pulse/HCG Rate: 6461 Temp:  (!) 97.3 F (36.3 C) Resp: 16 BP: 102/69 SpO2: 100 %  EBL: None  Complications: No immediate post-treatment complications observed by  team, or  reported by patient.  Note: The patient tolerated the entire procedure well. A repeat set of vitals were taken after the procedure and the patient was kept under observation following institutional policy, for this type of procedure. Post-procedural neurological assessment was performed, showing return to baseline, prior to discharge. The patient was provided with post-procedure discharge instructions, including a section on how to identify potential problems. Should any problems arise concerning this procedure, the patient was given instructions to immediately contact us, at any time, without hesitation. In any case, we plan to contact the patient by telephone for a follow-up status report regarding this interventional procedure.  Comments:  No additional relevant information.  Plan of Care  Orders:  Orders Placed This Encounter  Procedures   DG PAIN CLINIC C-ARM 1-60 MIN NO REPORT    Intraoperative interpretation by procedural physician at [facility].    Standing Status:   Standing    Number of Occurrences:   1    Order Specific Question:   Reason for exam:    Answer:   Assistance in needle guidance and placement for procedures requiring needle placement in or near specific anatomical locations not easily accessible without such assistance.    Medications ordered for procedure: Meds ordered this encounter  Medications   lidocaine (XYLOCAINE) 2 % (with pres) injection 400 mg   lactated ringers infusion 1,000 mL   midazolam (VERSED) 5 MG/5ML injection 0.5-2 mg    Make sure Flumazenil is available in the pyxis when using this medication. If oversedation occurs, administer 0.2 mg IV over 15 sec. If after 45 sec no response, administer 0.2 mg again over 1 min; may repeat at 1 min intervals; not to exceed 4 doses (1 mg)   dexamethasone (DECADRON) injection 10 mg   dexamethasone (DECADRON) injection 10 mg   ropivacaine (PF) 2 mg/mL (0.2%) (NAROPIN) injection 9 mL    ropivacaine (PF) 2 mg/mL (0.2%) (NAROPIN) injection 9 mL   Medications administered: We administered lidocaine, lactated ringers, midazolam, dexamethasone, dexamethasone, ropivacaine (PF) 2 mg/mL (0.2%), and ropivacaine (PF) 2 mg/mL (0.2%).  See the medical record for exact dosing, route, and time of administration.  Follow-up plan:   Return in about 7 weeks (around 01/12/2021) for Post Procedure Evaluation, virtual.     Recent Visits Date Type Provider Dept  11/11/20 Office Visit Edward Jolly, MD [facility]  Showing recent visits within past 90 days and meeting all other requirements Today's Visits Date Type Provider Dept  11/24/20 Procedure visit Edward Jolly, MD [facility]  Showing today's visits and meeting all other requirements Future Appointments Date Type Provider Dept  12/22/20 Appointment Edward Jolly, MD [facility]  Showing future appointments within next 90 days and meeting all other requirements Disposition: Discharge home  Discharge (Date  Time): 11/24/2020; 1042 hrs.   Primary Care Physician: Armando Gang, FNP Location: [facility] Note by: Edward Jolly, MD Date: 11/24/2020; Time: 1:35 PM  Disclaimer:  Medicine is not an exact science. The only guarantee in medicine is that nothing is guaranteed. It is important to note that the decision to proceed with this intervention was based on the information collected from the patient. The Data and conclusions were drawn from the patient's questionnaire, the interview, and the physical examination. Because the information was provided in large part by the patient, it cannot be guaranteed that it has not been purposely or unconsciously manipulated. Every effort has been made to obtain as much relevant  data as possible for this evaluation. It is important to note that the conclusions that lead to this procedure are derived in large part from the available data.  Always take into account that the treatment will also be dependent on availability of resources and existing treatment guidelines, considered by other Pain Management Practitioners as being common knowledge and practice, at the time of the intervention. For Medico-Legal purposes, it is also important to point out that variation in procedural techniques and pharmacological choices are the acceptable norm. The indications, contraindications, technique, and results of the above procedure should only be interpreted and judged by a Board-Certified Interventional Pain Specialist with extensive familiarity and expertise in the same exact procedure and technique.

## 2020-11-24 NOTE — Patient Instructions (Signed)

## 2020-11-24 NOTE — Progress Notes (Signed)
1028 Only the left side done. Patient unable to tolerate procedure.

## 2020-11-25 ENCOUNTER — Telehealth: Payer: Self-pay

## 2020-11-25 NOTE — Telephone Encounter (Signed)
Post procedure phone call.  Patient states he is doing good.  

## 2020-12-05 ENCOUNTER — Inpatient Hospital Stay: Payer: 59 | Attending: Oncology

## 2020-12-05 NOTE — Progress Notes (Signed)
Nutrition  Patient was a no show to nutrition appointment today.   Benjamine Strout B. Freida Busman, RD, LDN Registered Dietitian [phone] (mobile)

## 2020-12-18 ENCOUNTER — Encounter: Payer: Self-pay | Admitting: Student in an Organized Health Care Education/Training Program

## 2020-12-22 ENCOUNTER — Encounter: Payer: Self-pay | Admitting: Student in an Organized Health Care Education/Training Program

## 2020-12-22 ENCOUNTER — Other Ambulatory Visit: Payer: Self-pay

## 2020-12-22 ENCOUNTER — Ambulatory Visit
Payer: 59 | Attending: Student in an Organized Health Care Education/Training Program | Admitting: Student in an Organized Health Care Education/Training Program

## 2020-12-22 DIAGNOSIS — G894 Chronic pain syndrome: Secondary | ICD-10-CM

## 2020-12-22 DIAGNOSIS — Q761 Klippel-Feil syndrome: Secondary | ICD-10-CM | POA: Diagnosis not present

## 2020-12-22 DIAGNOSIS — M47812 Spondylosis without myelopathy or radiculopathy, cervical region: Secondary | ICD-10-CM

## 2020-12-22 NOTE — Progress Notes (Signed)
Patient: Joshua [name]  Service Category: E/M  Provider: Gillis Santa, MD  DOB: 02/21/1970  DOS: 12/22/2020  Location: Office  MRN: [id number]  Setting: Ambulatory outpatient  Referring Provider: Remi Haggard, FNP  Type: Established Patient  Specialty: Interventional Pain Management  PCP: Remi Haggard, FNP  Location: Remote location  Delivery: TeleHealth     Virtual Encounter - Pain Management PROVIDER NOTE: Information contained herein reflects review and annotations entered in association with encounter. Interpretation of such information and data should be left to medically-trained personnel. Information provided to patient can be located elsewhere in the medical record under "Patient Instructions". Document created using STT-dictation technology, any transcriptional errors that may result from process are unintentional.    Contact & Pharmacy Preferred: [phone] Home: [phone] (home) Mobile: [phone] (mobile) E-mail: [email or web address], [place] [address] [place] [address] Phone: [phone] Fax: [phone]  [id number] - [place], [id number]: [phone]: [phone]  Pre-screening  Joshua [name] offered "in-person" vs "virtual" encounter. [name]   Reason COVID-19*  Social distancing based on CDC and AMA recommendations.   I contacted Joshua Scaleon 12/22/2020 via telephone.      I clearly identified myself as BGillis Santa MD. I verified that I was speaking with the correct person using two identifiers (Name: Joshua [name] and date of birth: [id number].  Consent I sought verbal advanced consent from Joshua Scalefor virtual visit interactions. I informed Joshua [name] of possible security and privacy concerns, risks, and limitations  associated with providing "not-in-person" medical evaluation and management services. I also informed Joshua [name] of the availability of "in-person" appointments. Finally, I informed him that there would be a charge for the virtual visit and that he could be  personally, fully or partially, financially responsible for it. Joshua [name] understanding and agreed to proceed.   Historic Elements   Mr. TFARUQ ROSENBERGERis a 50y.o. year old, male patient evaluated today after our last contact on 11/24/2020. Joshua [name] has a past medical history of Allergy, Anxiety, Depression, Multiple sclerosis ([place], Neck pain, and Neuromuscular disorder ([place]. He also  has a past surgical history that includes Spinal fusion; Neck surgery (2014); Cranioplasty (2005); and Anterior cervical decomp/discectomy fusion (N/A, 02/20/2020). Mr. AStankehas a current medication list which includes the following prescription(s): acetaminophen, modafinil, and nortriptyline. He  reports that he has been smoking cigarettes. He has a 20.00 pack-year smoking history. He has never used smokeless tobacco. He reports current alcohol use. He reports current drug use. Frequency: 7.00 times per week. Drug: Marijuana. Joshua [name] allergic to penicillins.   HPI  Today, he is being contacted for a post-procedure assessment.   Post-Procedure Evaluation  Procedure (11/24/2020):   Procedure:          Anesthesia, Analgesia, Anxiolysis:  Type: Cervical Facet Medial Branch Block(s)           Primary Purpose: Diagnostic Region: Posterolateral cervical spine Level:  C5, C6, Medial Branch Level(s). Injecting these levels blocks the C5-6 cervical facet joints. Laterality: Left  Anesthesia: Local (1-2% Lidocaine)  Anxiolysis: IV 2 mg of IV Versed Sedation: Minimal  Guidance: Fluoroscopy           Position: Prone with head of the table raised to facilitate breathing.  Indications: 1. Cervical facet joint syndrome   2. Chronic  pain syndrome    Pain Score: Pre-procedure: 7 /10 Post-procedure: 6 /10      Anxiolysis: Please see nurses note.  Effectiveness during initial hour after procedure (Ultra-Short Term Relief): 100 %   Local anesthetic used: Long-acting (4-6 hours) Effectiveness: Defined as any analgesic benefit obtained secondary to the administration of local anesthetics. This carries significant diagnostic value as to the etiological location, or anatomical origin, of the pain. Duration of benefit is expected to coincide with the duration of the local anesthetic used.  Effectiveness during initial 4-6 hours after procedure (Short-Term Relief): 100 %   Long-term benefit: Defined as any relief past the pharmacologic duration of the local anesthetics.  Effectiveness past the initial 6 hours after procedure (Long-Term Relief): 0 %   Benefits, current: Defined as benefit present at the time of this evaluation.   Analgesia:  0%   Do not recommend RFA, follow-up with neurology for MS management. Laboratory Chemistry Profile   Renal Lab Results  Component Value Date   BUN 11 11/21/2020   CREATININE 0.92 93/81/8299   BCR NOT APPLICABLE 37/16/9678   GFRAA >60 04/12/2019   GFRNONAA >60 11/21/2020    Hepatic Lab Results  Component Value Date   AST 20 11/21/2020   ALT 13 11/21/2020   ALBUMIN 4.1 11/21/2020   ALKPHOS 56 11/21/2020    Electrolytes Lab Results  Component Value Date   NA 134 (L) 11/21/2020   K 4.0 11/21/2020   CL 102 11/21/2020   CALCIUM 8.7 (L) 11/21/2020    Bone Lab Results  Component Value Date   VD25OH 30.66 09/29/2020    Inflammation (CRP: Acute Phase) (ESR: Chronic Phase) No results found for: CRP, ESRSEDRATE, LATICACIDVEN       Note: Above Lab results reviewed.   Assessment  The primary encounter diagnosis was Cervical facet joint syndrome. Diagnoses of Cervical fusion syndrome (C4-C7) and Chronic pain syndrome were also pertinent to this visit.  Plan of Care    No benefit with diagnostic cervical facet medial branch nerve block.  Do not recommend subsequent radiofrequency ablation.  Follow with neurology for continued management.  Follow-up plan:   No follow-ups on file.    Recent Visits Date Type Provider Dept  11/24/20 Procedure visit Gillis Santa, MD [facility]  11/11/20 Office Visit Gillis Santa, MD [facility]  Showing recent visits within past 90 days and meeting all other requirements Today's Visits Date Type Provider Dept  12/22/20 Office Visit Gillis Santa, MD [facility]  Showing today's visits and meeting all other requirements Future Appointments No visits were found meeting these conditions. Showing future appointments within next 90 days and meeting all other requirements I discussed the assessment and treatment plan with the patient. The patient was provided an opportunity to ask questions and all were answered. The patient agreed with the plan and demonstrated an understanding of the instructions.  Patient advised to call back or seek an in-person evaluation if the symptoms or condition worsens.  Duration of encounter: 28mnutes.  Note by: BGillis Santa MD Date: 12/22/2020; Time: 3:27 PM

## 2020-12-23 ENCOUNTER — Telehealth: Payer: Self-pay | Admitting: Neurology

## 2020-12-23 NOTE — Telephone Encounter (Signed)
[facility] called in stating she started the patient on Ocrivis infusion on 12/11/20 and was due to second dose this week, but cancelled the appointment. He stated he was going to check up with his doctor about continuing it. He wouldn't discuss any of his concerns with them. She is requesting a call to the patient to discuss.

## 2020-12-29 NOTE — Telephone Encounter (Signed)
Per pt and his wife which he added as a merge call.  Per Wife she was the one who cancelled the appt for the patient. He wan't feeling good that day and they never gave her a chance to reschedule the appt.  She never told them he wante to speak to Dr. Everlena Cooper before he gets his next infusion.   Per pt Wife she will call and reschedule.

## 2021-01-07 ENCOUNTER — Telehealth: Payer: Self-pay | Admitting: Neurology

## 2021-01-07 NOTE — Telephone Encounter (Signed)
[facility] called regarding pt ocrevus. They need to know how they need to proceed.  Can call [phone].  I LM on sheena VM

## 2021-01-07 NOTE — Telephone Encounter (Signed)
[facility] office wanted to know if the patient could restart where he left off or do he need to restart at 300 mg IV of Ocrevus since he hasn't had an infusion in a while. Patient had to cancel his infusion in 12/2020.    Please advise

## 2021-01-07 NOTE — Telephone Encounter (Signed)
Advised [facility] Dr.Jaffe note, He would need to repeat the split starting dose.  This is the second time that he missed an infusion.  If we are going to continue with Ocrevus, then it must be done at home.  If this is not possible then we would need to discuss starting a different medication.  They have tried to contact the patient as well to see if they can go ahead and get him schedule and not response. They will restart the splitting dose two weeks apart. Per Rep the Ocrevus  infusion is given at the patient home.

## 2021-04-06 ENCOUNTER — Other Ambulatory Visit: Payer: Self-pay

## 2021-04-06 ENCOUNTER — Emergency Department
Admission: EM | Admit: 2021-04-06 | Discharge: 2021-04-06 | Disposition: A | Discharge status: Home / Self Care | Payer: 59 | Attending: Emergency Medicine | Admitting: Emergency Medicine

## 2021-04-06 ENCOUNTER — Encounter: Payer: Self-pay | Admitting: Emergency Medicine

## 2021-04-06 DIAGNOSIS — M542 Cervicalgia: Secondary | ICD-10-CM | POA: Insufficient documentation

## 2021-04-06 LAB — CBC WITH DIFFERENTIAL/PLATELET
Abs Immature Granulocytes: 0.09 10*3/uL — ABNORMAL HIGH (ref 0.00–0.07)
Basophils Absolute: 0 10*3/uL (ref 0.0–0.1)
Basophils Relative: 0 %
Eosinophils Absolute: 0 10*3/uL (ref 0.0–0.5)
Eosinophils Relative: 0 %
HCT: 42.1 % (ref 39.0–52.0)
Hemoglobin: 14.5 g/dL (ref 13.0–17.0)
Immature Granulocytes: 1 %
Lymphocytes Relative: 14 %
Lymphs Abs: 2 10*3/uL (ref 0.7–4.0)
MCH: 30 pg (ref 26.0–34.0)
MCHC: 34.4 g/dL (ref 30.0–36.0)
MCV: 87.2 fL (ref 80.0–100.0)
Monocytes Absolute: 0.7 10*3/uL (ref 0.1–1.0)
Monocytes Relative: 5 %
Neutro Abs: 11.7 10*3/uL — ABNORMAL HIGH (ref 1.7–7.7)
Neutrophils Relative %: 80 %
Platelets: 257 10*3/uL (ref 150–400)
RBC: 4.83 MIL/uL (ref 4.22–5.81)
RDW: 12.5 % (ref 11.5–15.5)
WBC: 14.5 10*3/uL — ABNORMAL HIGH (ref 4.0–10.5)
nRBC: 0 % (ref 0.0–0.2)

## 2021-04-06 LAB — BASIC METABOLIC PANEL
Anion gap: 10 (ref 5–15)
BUN: 9 mg/dL (ref 6–20)
CO2: 24 mmol/L (ref 22–32)
Calcium: 9.2 mg/dL (ref 8.9–10.3)
Chloride: 101 mmol/L (ref 98–111)
Creatinine, Ser: 0.86 mg/dL (ref 0.61–1.24)
GFR, Estimated: >60 mL/min (ref >60)
Glucose, Bld: 156 mg/dL — ABNORMAL HIGH (ref 70–99)
Potassium: 3.6 mmol/L (ref 3.5–5.1)
Sodium: 135 mmol/L (ref 135–145)

## 2021-04-06 MED ORDER — METHYLPREDNISOLONE SODIUM SUCC 125 MG IJ SOLR
125.0000 mg | Freq: Once | INTRAMUSCULAR | Status: AC
Start: 2021-04-06 — End: 2021-04-06
  Administered 2021-04-06: 125 mg via INTRAVENOUS
  Filled 2021-04-06: qty 2

## 2021-04-06 MED ORDER — HYDROMORPHONE HCL 1 MG/ML IJ SOLN
1.0000 mg | Freq: Once | INTRAMUSCULAR | Status: AC
  Administered 2021-04-06: 1 mg via SUBCUTANEOUS
  Filled 2021-04-06: qty 1

## 2021-04-06 MED ORDER — PREDNISONE 50 MG PO TABS: 50.0000 mg | ORAL_TABLET | Freq: Every day | ORAL | 0 refills | Status: DC

## 2021-04-06 MED ORDER — PREDNISONE 20 MG PO TABS
60.0000 mg | ORAL_TABLET | Freq: Once | ORAL | Status: DC
  Filled 2021-04-06: qty 3

## 2021-04-06 NOTE — ED Provider Notes (Signed)
? ?[facility] ?Provider Note ? ? ? Event Date/Time  ? First MD Initiated Contact with Patient 04/06/21 1032   ?  (approximate) ? ? ?History  ? ?Neck Pain ? ? ?HPI ? ?Joshua [name] is a 51 y.o. male with a history of multiple cervical surgeries and chronic neck pain as well as MS presents with complaints of neck pain.  Patient reports that he is unable to get any relief from his medication.  He does see pain management last surgery was in February 2022.  He reports he has analgesics at home but feels that he needs steroids to help ? ?No weakness or numbness, no headache ?  ? ? ?Physical Exam  ? ?Triage Vital Signs: ?ED Triage Vitals  ?Enc Vitals Group  ?   BP 04/06/21 0841 (!) 132/93  ?   Pulse Rate 04/06/21 0841 88  ?   Resp 04/06/21 0841 16  ?   Temp 04/06/21 0841 98.6 ?F (37 ?C)  ?   Temp Source 04/06/21 0841 Oral  ?   SpO2 04/06/21 0841 98 %  ?   Weight 04/06/21 0845 63.5 kg (140 lb)  ?   Height 04/06/21 0845 1.702 m (5\' 7" )  ?   Head Circumference --   ?   Peak Flow --   ?   Pain Score 04/06/21 0844 9  ?   Pain Loc --   ?   Pain Edu? --   ?   Excl. in GC? --   ? ? ?Most recent vital signs: ?Vitals:  ? 04/06/21 0841  ?BP: (!) 132/93  ?Pulse: 88  ?Resp: 16  ?Temp: 98.6 ?F (37 ?C)  ?SpO2: 98%  ? ? ? ?General: Awake, no distress.  ?CV:  Good peripheral perfusion.  ?Resp:  Normal effort.  ?Abd:  No distention.  ?Other:  Neuro exam is normal, ambulating well, normal strength ? ? ?ED Results / Procedures / Treatments  ? ?Labs ?(all labs ordered are listed, but only abnormal results are displayed) ?Labs Reviewed  ?CBC WITH DIFFERENTIAL/PLATELET - Abnormal; Notable for the following components:  ?    Result Value  ? WBC 14.5 (*)   ? Neutro Abs 11.7 (*)   ? Abs Immature Granulocytes 0.09 (*)   ? All other components within normal limits  ?BASIC METABOLIC PANEL - Abnormal; Notable for the following components:  ? Glucose, Bld 156 (*)   ? All other components within normal limits   ? ? ? ?EKG ? ? ? ? ?RADIOLOGY ? ? ? ? ?PROCEDURES: ? ?Critical Care performed:  ? ?Procedures ? ? ?MEDICATIONS ORDERED IN ED: ?Medications  ?HYDROmorphone (DILAUDID) injection 1 mg (has no administration in time range)  ?predniSONE (DELTASONE) tablet 60 mg (has no administration in time range)  ? ? ? ?IMPRESSION / MDM / ASSESSMENT AND PLAN / ED COURSE  ?I reviewed the triage vital signs and the nursing notes. ? ? ?Patient presents with neck pain as detailed above, made worse by his "jerks "that he says he has from MS.  He states that in the past prednisone has significantly helped with this.  We will give a dose of subcu Dilaudid because he is in significant acute on chronic pain. ? ?We will also give p.o. prednisone with a prescription for this.  He has appropriate follow-up arranged ? ? ? ? ?  ? ? ?FINAL CLINICAL IMPRESSION(S) / ED DIAGNOSES  ? ?Final diagnoses:  ?Neck pain  ? ? ? ?Rx /  DC Orders  ? ?ED Discharge Orders   ? ?      Ordered  ?  predniSONE (DELTASONE) 50 MG tablet  Daily with breakfast       ? Pending  ? ?  ?  ? ?  ? ? ? ?Note:  This document was prepared using Dragon voice recognition software and may include unintentional dictation errors. ?  ?Jene Every, MD ?04/06/21 1102 ? ?

## 2021-04-06 NOTE — ED Triage Notes (Signed)
Pt to ED via POV stating that he is having severe neck pain. Pt states that he had a fusion last year. Pt states that he also has a history of MS. Pt states that he is having a lot of involuntary jumping due to his MS and this is making his pain worse. Pt states that normally when his symptoms get this bad he has to get IV steroids. Pt is in NAD.  ?

## 2021-04-07 NOTE — Progress Notes (Signed)
Virtual Visit via Video Note The purpose of this virtual visit is to provide medical care while limiting exposure to the novel coronavirus.    Consent was obtained for video visit:  Yes.   Answered questions that patient had about telehealth interaction:  Yes.   I discussed the limitations, risks, security and privacy concerns of performing an evaluation and management service by telemedicine. I also discussed with the patient that there may be a patient responsible charge related to this service. The patient expressed understanding and agreed to proceed.  Pt location: Home Physician Location: office Name of referring provider:  Armando Gang, FNP I connected with Joshua [name] at patients initiation/request on 04/08/2021 at  53:10 PM EST by video enabled telemedicine application and verified that I am speaking with the correct person using two identifiers. 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 Repeat MRI brain/c-spine with and without contrast in 6 months. Refill nortriptyline 50mg  QHS and modafinil 100mg  daily Recommended to use a pill box to help remember medications Continue follow up with pain management Follow up 6 months.     Subjective:  Joshua [name] is a 51 year old left-handed African American male s/p C5-7 and C3-4 ACDF who follows up for multiple sclerosis.  MRIs from May and Saturday personally reviewed.  Accompanied by his wife via phone.   UPDATE: Current DMT:  Ocrevus. Other medications:  modafinil 100mg  daily, nortriptyline 50mg  QHS No longer taking vit D.    Restarted Ocrevus on 12/11/2020 but missed follow up dose 2 weeks  later because he wasn't feeling well.  He missed his second split dose again.  He finally was able to get a complete starting dose about a month ago.    Vision:  No issues Motor:  No weakness.  He reports that sometimes his legs start shaking when he first wakes up, lasting up to a minute. Sensory:  Paresthesias involving all extremities.  NCV-EMG from 05/27/2014 showed right ulnar neuropathy Pain:  Chronic diffuse pain.  Chronic neck pain s/p ACDF.  No improvement in pain despite surgery.  Chronic back pain.  Chronic chest pain.  Referred to pain management, Dr. Cherylann Ratel.  Recently in the ED for pain. Gait:  Unsteady gait.   Bowel/Bladder:  No issues Fatigue:  Daytime fatigue.  Poor sleep Cognition:  Reports short term memory deficits.  He forgets things to happened the previous day.  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  Mood:  Depressed.  Trouble sleeping. Erectile dysfunction   HISTORY:  Initially presented with hand numbness and diffuse pain.  MRI of brain and cervical cord at the time revealed numerous periventricular and cervical spinal cord lesions.  He did not follow up with neurology and continued to experience diffuse pain, arm and hand numbness, unsteady gait with falls and "MS hug".   Other pertinent history: 2005 Subdural hematoma: "spontaneous bleed" in setting of cocaine use and high blood pressure s/p surgery on the left skull region.  Multiple cervical spine surgeries with chronic neck pain.  07/12/2013 ACDF C4-5 and C6-7.  Headaches resolved.  Still with neck pain. Last surgery was February 2022.  Right ulnar neuropathy on NCV-EMG 05/27/2014. History of alcoholism and drug addiction.   Family History:  Sister (diagnosed with MS in her 32s)  Past disease modifying therapy:  Tecfidera (reports that it didn't work) Past medications:  Cymbalta 30mg  daily; gabapentin, Lyrica, Flexeril, paroxetine   Imaging: 11/13/2009 MRI BRAIN W WO:   Multiple periventricular, deep and subcortical white matter lesions, including characteristic perpendicular lesion adjacent to the right ventricle. 11/13/2009 MRI CERVICAL & THORACIC SPINE W WO:  T2/STIR hyperintense lesions in the cervical medullary junction, C5, T1-2, T7-8, and T9  vertebral levels without evidence of enhancement.  Multilevel degenerative changes of the cervical spine. 07/07/2010 MRI CERVICAL SPINE W WO (personally reviewed):  Nonenhancing lesions at C1 and C5 levels. 01/04/2012 MRI BRAIN W WO (personally reviewed):  Two tiny nonspecific nonenhancing hyperintense foci in subcortical white matter. 05/17/2013 MRI CERVICAL SPINE WO:  Multiple nonenhancing lesions within spinal cord, reportedly stable compared to imaging from 07/07/2010.  Spondylosis with right paracentral disc herniation without neural compression at C3-4, broad based disc protrusion with bilateral neural foraminal stenosis compressing C7 nerve roots at C6-7, and left-sided facet arthropathy with edema at C7-T1 09/11/2013 MRI CERVICAL SPINE WO:  S/p ACDF at C4-5 and C6-7, increased disc herniation and spinal stenosis at C3-4, chronjic left facet arthropathy at C7-T1. 06/07/2014 MRI BRAIN W WO: Multiple T2 hyperintense lesions within periventricular and subcortical white matter without abnormal enhancement. 06/07/2014 MRI BRAIN W WO: Multiple T2 hyperintense peripheral cervical spinal cord lesions from the level of the C1 arch to the T2 level without abnormal enhancement.  Status post C4-C7 ACDF.  Small disc bulge at C3-C4 resulting in mild spinal canal stenosis.  02/13/2019 MRI BRAIN W WO:  1. Mild patchy T2/FLAIR signal abnormality involving the periventricular and juxta cortical supratentorial cerebral white matter, consistent with multiple sclerosis. Overall, appearance is mildly progressed relative to 2013. No evidence for active demyelination.  2. Underlying mildly progressed cerebral atrophy for age.  3. Chronic right  maxillary sinusitis. 02/13/2019 MRI CERVICAL SPINE W WO:  1. Patchy multifocal cord signal abnormality involving the cervical spinal cord as above, consistent with history of multiple sclerosis.  Overall, appearance is minimally progressed relative to 2015. No evidence for active demyelination.  2. Prior ACDF at C4 through C7 without residual stenosis.  3. Adjacent segment disease with central disc protrusion at C3-4 indenting the ventral thecal sac with resultant moderate spinal stenosis and moderate right and mild left C4 foraminal stenosis. 10/20/2019 MRI BRAIN W WO: Stable 10/20/2019 MRI CERVICAL SPINE W WO:  Stable.  Moderate spinal stenosis at C3-4.  No spinal stenosis at fused C5-7 levels.  Small disc central disc protrusion indenting ventral thecal sac at C2-3 without significant stenosis.   06/16/2020 MRI C-SPINE W WO:  Chronic demyelinating lesions C2 and C5, consistent with history of multiple sclerosis. No new lesions.  Prior C3-C5 and C6-C7 ACDF. Unchanged adjacent segment disease at C5-C6 with moderate bilateral neuroforaminal stenosis. 09/27/2020 MRI BRAIN W WO:  1. No significant interval change in distribution and number of chronic demyelinating lesions involving the supratentorial cerebral white matter. No new lesions or evidence for active demyelination. 09/27/2020 MRI C-SPINE W WO:  1. Chronic demyelinating lesions at C2, C5, and within the upper thoracic cord, stable. No new lesions to suggest disease progression or active demyelination.  2. Prior ACDF at C3-C5 and C6-7. Adjacent segment disease at C5-6 with mild left greater than right C5 foraminal narrowing, stable.   Labs: 04/22/2014 positive JC Virus Ab  with index 1.28 Reportedly underwent lumbar puncture for CSF analysis, revealing 10 oligoclonal bands.  Past Medical History: Past Medical History:  Diagnosis Date   Allergy    Anxiety    Depression    Multiple sclerosis (HCC)    Neck pain    Neuromuscular disorder (HCC)      Medications: Outpatient Encounter Medications as of 04/08/2021  Medication Sig   acetaminophen (TYLENOL) 500 MG tablet Take 2 tablets (1,000 mg total) by mouth every 6 (six) hours as needed for mild pain or moderate pain.   modafinil (PROVIGIL) 100 MG tablet Take 1 tablet (100 mg total) by mouth in the morning.   nortriptyline (PAMELOR) 25 MG capsule Take 1 capsule at bedtime for a week, then increase to 2 capsules at bedtime   predniSONE (DELTASONE) 50 MG tablet Take 1 tablet (50 mg total) by mouth daily with breakfast.   No facility-administered encounter medications on file as of 04/08/2021.    Allergies: Allergies  Allergen Reactions   Penicillins Hives    As a child     Family History: Family History  Problem Relation Age of Onset   Hypertension Mother    Hypertension Father    Multiple sclerosis Sister    Aneurysm Brother     Observations/Objective:   No acute distress.  Alert and oriented.  Speech fluent and not dysarthric.  Language intact.     Follow Up Instructions:    -I discussed the assessment and treatment plan with the patient. The patient was provided an opportunity to ask questions and all were answered. The patient agreed with the plan and demonstrated an understanding of the instructions.   The patient was advised to call back or seek an in-person evaluation if the symptoms worsen or if the condition fails to improve as anticipated.   Cira Servant, DO

## 2021-04-08 ENCOUNTER — Encounter: Payer: Self-pay | Admitting: Neurology

## 2021-04-08 ENCOUNTER — Other Ambulatory Visit: Payer: Self-pay

## 2021-04-08 ENCOUNTER — Telehealth (INDEPENDENT_AMBULATORY_CARE_PROVIDER_SITE_OTHER): Payer: 59 | Admitting: Neurology

## 2021-04-08 VITALS — Ht 67.0 in | Wt 145.0 lb

## 2021-04-08 DIAGNOSIS — Q761 Klippel-Feil syndrome: Secondary | ICD-10-CM

## 2021-04-08 DIAGNOSIS — R251 Tremor, unspecified: Secondary | ICD-10-CM

## 2021-04-08 DIAGNOSIS — G894 Chronic pain syndrome: Secondary | ICD-10-CM

## 2021-04-08 DIAGNOSIS — G35 Multiple sclerosis: Secondary | ICD-10-CM | POA: Diagnosis not present

## 2021-04-08 DIAGNOSIS — R4189 Other symptoms and signs involving cognitive functions and awareness: Secondary | ICD-10-CM

## 2021-04-08 MED ORDER — MODAFINIL 100 MG PO TABS: 100.0000 mg | ORAL_TABLET | Freq: Every morning | ORAL | 2 refills | Status: DC

## 2021-04-08 MED ORDER — NORTRIPTYLINE HCL 50 MG PO CAPS: 50.0000 mg | ORAL_CAPSULE | Freq: Every day | ORAL | 5 refills | Status: DC

## 2021-04-08 NOTE — Addendum Note (Signed)
Addended by: Venetia Night on: 04/08/2021 02:16 PM ? ? Modules accepted: Orders ? ?

## 2021-04-10 ENCOUNTER — Ambulatory Visit: Payer: 59 | Admitting: Neurology

## 2021-05-13 ENCOUNTER — Telehealth: Payer: Self-pay | Admitting: Neurology

## 2021-05-13 NOTE — Telephone Encounter (Signed)
Caller states he needs to clarify the correct orders for PT. He states there is an error on PT orders therapy. Needs to speak with doctor to continue treatment ?

## 2021-05-19 NOTE — Telephone Encounter (Signed)
Per pt he wants to stay on Ocrevus. He also wanted to let Dr.Jaffe know that the pain is bad. What should he do. ? ?Every day he feels like he has worked for over 18 hours. His body feels weak, he's  tired all the time. ? ? ?Please advise.  ?

## 2021-05-19 NOTE — Telephone Encounter (Signed)
Pt advised, and appt schedule for 05/21/21. ?am not comfortable about continuing the Ocrevus as he has not been compliant.  I think he needds to make a follow up appointment to discuss this or other options.  He is supposed to have a pain specialist to treat his pain.  This is above my scope of practice.  ?

## 2021-05-20 NOTE — Progress Notes (Signed)
? ?NEUROLOGY FOLLOW UP OFFICE NOTE ? ?Joshua [name] ?[id number] ? ?Assessment/Plan:  ? ?Multiple sclerosis ?Cervical fusion syndrome ?Chronic pain syndrome ?Spinal myoclonus ?Chronic fatigue ? ?  ?DMT:  Will continue Ocrevus.  Discussed importance of taking it on time.  Advised to reschedule next infusion at time of current infusion ?Check quantitative immunoglobulin panel and vit D now and in 6 months. ?Repeat MRI brain/c-spine with and without contrast in 6 months. ?Nortriptyline 50mg  QHS and modafinil 100mg  daily ?Start levetiracetam 250mg  twice daily for treatment of spinal myoclonus ?Continue follow up with pain management ?Follow up 6 months. ? ?Subjective:  ?Joshua [name] is a 51 year old left-handed African American male s/p C5-7 and C3-4 ACDF who follows up for multiple sclerosis.  ?  ?UPDATE: ?Current DMT:  Ocrevus. ?Other medications:  modafinil 100mg  daily, nortriptyline 50mg  QHS ?No longer taking vit D.   ?  ?When he restarted Ocrevus, he received his second dose 8 weeks after the first dose.  The infusion center said they tried calling him.  He says he never received a call.   ?  ?Vision:  No issues ?Motor:  No weakness.  Sometimes his legs may jerk.  Also body jerks as well. ?Sensory:  Paresthesias involving all extremities.  NCV-EMG from 05/27/2014 showed right ulnar neuropathy ?Pain:  Chronic diffuse pain.  Chronic neck pain s/p ACDF.  No improvement in pain despite surgery.  Chronic back pain.  Chronic chest pain.  Referred to pain management, Dr. Marguerite Olea.  He is in constant pain.  It is difficult for him to function.  He was denied disability.  Thinking about reapplying.   ?Gait:  Unsteady gait.   ?Bowel/Bladder:  No issues ?Fatigue:  Daytime fatigue.  Poor sleep ?Cognition:  Reports short term memory deficits.  He forgets things to happened the previous day.  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  ?Mood:  Depressed.  Trouble sleeping. ?Erectile  dysfunction ?  ?HISTORY:  ?Initially presented with hand numbness and diffuse pain.  MRI of brain and cervical cord at the time revealed numerous periventricular and cervical spinal cord lesions.  He did not follow up with neurology and continued to experience diffuse pain, arm and hand numbness, unsteady gait with falls and "MS hug". ?  ?Other pertinent history: ?2005 Subdural hematoma: "spontaneous bleed" in setting of cocaine use and high blood pressure s/p surgery on the left skull region.  ?Multiple cervical spine surgeries with chronic neck pain.  07/12/2013 ACDF C4-5 and C6-7.  Headaches resolved.  Still with neck pain. Last surgery was February 2022. ?Right ulnar neuropathy on NCV-EMG 05/27/2014. ?History of alcoholism and drug addiction. ?  ?Family History:  Sister (diagnosed with MS in her 30s) ? ?Past disease modifying therapy:  Tecfidera (reports that it didn't work) ?Past medications:  Cymbalta 30mg  daily; gabapentin, Lyrica, Flexeril, paroxetine ?  ?Imaging: ?11/13/2009 MRI BRAIN W WO:  Multiple periventricular, deep and subcortical white matter lesions, including characteristic perpendicular lesion adjacent to the right ventricle. ?11/13/2009 MRI CERVICAL & THORACIC SPINE W WO:  T2/STIR hyperintense lesions in the cervical medullary junction, C5, T1-2, T7-8, and T9  vertebral levels without evidence of enhancement.  Multilevel degenerative changes of the cervical spine. ?07/07/2010 MRI CERVICAL SPINE W WO (personally reviewed):  Nonenhancing lesions at C1 and C5 levels. ?01/04/2012 MRI BRAIN W WO (personally reviewed):  Two tiny nonspecific nonenhancing hyperintense foci in subcortical white matter. ?05/17/2013 MRI CERVICAL SPINE WO:  Multiple  nonenhancing lesions within spinal cord, reportedly stable compared to imaging from 07/07/2010.  Spondylosis with right paracentral disc herniation without neural compression at C3-4, broad based disc protrusion with bilateral neural foraminal stenosis compressing C7  nerve roots at C6-7, and left-sided facet arthropathy with edema at C7-T1 ?09/11/2013 MRI CERVICAL SPINE WO:  S/p ACDF at C4-5 and C6-7, increased disc herniation and spinal stenosis at C3-4, chronjic left facet arthropathy at C7-T1. ?06/07/2014 MRI BRAIN W WO: Multiple T2 hyperintense lesions within periventricular and subcortical white matter without abnormal enhancement. ?06/07/2014 MRI BRAIN W WO: Multiple T2 hyperintense peripheral cervical spinal cord lesions from the level of the C1 arch to the T2 level without abnormal enhancement.  Status post C4-C7 ACDF.  Small disc bulge at C3-C4 resulting in mild spinal canal stenosis. ? 02/13/2019 MRI BRAIN W WO:  1. Mild patchy T2/FLAIR signal abnormality involving the periventricular and juxta cortical supratentorial cerebral white matter, consistent with multiple sclerosis. Overall, appearance is mildly progressed relative to 2013. No evidence for active demyelination.  2. Underlying mildly progressed cerebral atrophy for age.  3. Chronic right maxillary sinusitis. ?02/13/2019 MRI CERVICAL SPINE W WO:  1. Patchy multifocal cord signal abnormality involving the cervical spinal cord as above, consistent with history of multiple sclerosis.  Overall, appearance is minimally progressed relative to 2015. No evidence for active demyelination.  2. Prior ACDF at C4 through C7 without residual stenosis.  3. Adjacent segment disease with central disc protrusion at C3-4 indenting the ventral thecal sac with resultant moderate spinal stenosis and moderate right and mild left C4 foraminal stenosis. ?10/20/2019 MRI BRAIN W WO: Stable ?10/20/2019 MRI CERVICAL SPINE W WO:  Stable.  Moderate spinal stenosis at C3-4.  No spinal stenosis at fused C5-7 levels.  Small disc central disc protrusion indenting ventral thecal sac at C2-3 without significant stenosis.   ?06/16/2020 MRI C-SPINE W WO:  Chronic demyelinating lesions C2 and C5, consistent with history ?of multiple sclerosis. No new  lesions.  Prior C3-C5 and C6-C7 ACDF. Unchanged adjacent segment disease at ?C5-C6 with moderate bilateral neuroforaminal stenosis. ?09/27/2020 MRI BRAIN W WO:  1. No significant interval change in distribution and number of chronic demyelinating lesions involving the supratentorial cerebral white matter. No new lesions or evidence for active demyelination. ?09/27/2020 MRI C-SPINE W WO:  1. Chronic demyelinating lesions at C2, C5, and within the upper ?thoracic cord, stable. No new lesions to suggest disease progression or active demyelination.  2. Prior ACDF at C3-C5 and C6-7. Adjacent segment disease at C5-6 with mild left greater than right C5 foraminal narrowing, stable. ?  ?Labs: ?04/22/2014 positive JC Virus Ab with index 1.28 ?Reportedly underwent lumbar puncture for CSF analysis, revealing 10 oligoclonal bands. ? ?PAST MEDICAL HISTORY: ?Past Medical History:  ?Diagnosis Date  ? Allergy   ? Anxiety   ? Depression   ? Multiple sclerosis (HCC)   ? Neck pain   ? Neuromuscular disorder (HCC)   ? ? ?MEDICATIONS: ?Current Outpatient Medications on File Prior to Visit  ?Medication Sig Dispense Refill  ? acetaminophen (TYLENOL) 500 MG tablet Take 2 tablets (1,000 mg total) by mouth every 6 (six) hours as needed for mild pain or moderate pain. 30 tablet 0  ? modafinil (PROVIGIL) 100 MG tablet Take 1 tablet (100 mg total) by mouth in the morning. 30 tablet 2  ? nortriptyline (PAMELOR) 50 MG capsule Take 1 capsule (50 mg total) by mouth at bedtime. 30 capsule 5  ? predniSONE (DELTASONE) 50 MG tablet Take 1  tablet (50 mg total) by mouth daily with breakfast. 4 tablet 0  ? ?No current facility-administered medications on file prior to visit.  ? ? ?ALLERGIES: ?Allergies  ?Allergen Reactions  ? Penicillins Hives  ?  As a child ?  ? ? ?FAMILY HISTORY: ?Family History  ?Problem Relation Age of Onset  ? Hypertension Mother   ? Hypertension Father   ? Multiple sclerosis Sister   ? Aneurysm Brother   ? ? ?  ?Objective:  ?Blood  pressure 129/88, pulse 88, height 5\' 7"  (1.702 m), weight 139 lb 3.2 oz (63.1 kg), SpO2 96 %. ?General: No acute distress.  Patient appears well-groomed.   ?Head:  Normocephalic/atraumatic ?Eyes:  Fundi e

## 2021-05-21 ENCOUNTER — Ambulatory Visit: Payer: 59 | Admitting: Neurology

## 2021-05-21 ENCOUNTER — Other Ambulatory Visit (INDEPENDENT_AMBULATORY_CARE_PROVIDER_SITE_OTHER): Payer: 59

## 2021-05-21 ENCOUNTER — Encounter: Payer: Self-pay | Admitting: Neurology

## 2021-05-21 VITALS — BP 129/88 | HR 88 | Ht 67.0 in | Wt 139.2 lb

## 2021-05-21 DIAGNOSIS — G35 Multiple sclerosis: Secondary | ICD-10-CM | POA: Diagnosis not present

## 2021-05-21 DIAGNOSIS — R5383 Other fatigue: Secondary | ICD-10-CM

## 2021-05-21 DIAGNOSIS — Q761 Klippel-Feil syndrome: Secondary | ICD-10-CM | POA: Diagnosis not present

## 2021-05-21 DIAGNOSIS — R519 Headache, unspecified: Secondary | ICD-10-CM

## 2021-05-21 DIAGNOSIS — G253 Myoclonus: Secondary | ICD-10-CM

## 2021-05-21 DIAGNOSIS — G894 Chronic pain syndrome: Secondary | ICD-10-CM | POA: Diagnosis not present

## 2021-05-21 LAB — COMPREHENSIVE METABOLIC PANEL
ALT: 14 U/L (ref 0–53)
AST: 18 U/L (ref 0–37)
Albumin: 4.1 g/dL (ref 3.5–5.2)
Alkaline Phosphatase: 59 U/L (ref 39–117)
BUN: 7 mg/dL (ref 6–23)
CO2: 29 mEq/L (ref 19–32)
Calcium: 8.8 mg/dL (ref 8.4–10.5)
Chloride: 103 mEq/L (ref 96–112)
Creatinine, Ser: 0.79 mg/dL (ref 0.40–1.50)
GFR: 101.96 mL/min (ref >60.00)
Glucose, Bld: 83 mg/dL (ref 70–99)
Potassium: 3.9 mEq/L (ref 3.5–5.1)
Sodium: 138 mEq/L (ref 135–145)
Total Bilirubin: 0.3 mg/dL (ref 0.2–1.2)
Total Protein: 6.7 g/dL (ref 6.0–8.3)

## 2021-05-21 LAB — VITAMIN D 25 HYDROXY (VIT D DEFICIENCY, FRACTURES): VITD: 25.43 ng/mL — ABNORMAL LOW (ref 30.00–100.00)

## 2021-05-21 MED ORDER — LEVETIRACETAM 250 MG PO TABS: 250.0000 mg | ORAL_TABLET | Freq: Two times a day (BID) | ORAL | 5 refills | Status: DC

## 2021-05-21 NOTE — Patient Instructions (Addendum)
Continue Ocrevus ?For jerking, start levetiracetam 250mg  twice daily ?Continue nortriptyline 50mg  at bedtime for headache and pain ?Continue modafanil for sleepiness ?Check CBC with diff, CMP, immunoglobulins and vit D ?MRI of brain and c-spine w/wo in 6 months ?Follow up 6 months ?

## 2021-05-22 LAB — CBC WITH DIFFERENTIAL
Basophils Absolute: 0.1 10*3/uL (ref 0.0–0.2)
Basos: 1 %
EOS (ABSOLUTE): 0.4 10*3/uL (ref 0.0–0.4)
Eos: 5 %
Hematocrit: 43.5 % (ref 37.5–51.0)
Hemoglobin: 14.7 g/dL (ref 13.0–17.7)
Immature Grans (Abs): 0 10*3/uL (ref 0.0–0.1)
Immature Granulocytes: 0 %
Lymphocytes Absolute: 1.3 10*3/uL (ref 0.7–3.1)
Lymphs: 15 %
MCH: 30.6 pg (ref 26.6–33.0)
MCHC: 33.8 g/dL (ref 31.5–35.7)
MCV: 91 fL (ref 79–97)
Monocytes Absolute: 0.9 10*3/uL (ref 0.1–0.9)
Monocytes: 10 %
Neutrophils Absolute: 5.7 10*3/uL (ref 1.4–7.0)
Neutrophils: 69 %
RBC: 4.8 x10E6/uL (ref 4.14–5.80)
RDW: 12.8 % (ref 11.6–15.4)
WBC: 8.2 10*3/uL (ref 3.4–10.8)

## 2021-05-22 LAB — IGG, IGA, IGM
IgG (Immunoglobin G), Serum: 1193 mg/dL (ref 600–1640)
IgM, Serum: 48 mg/dL — ABNORMAL LOW (ref 50–300)
Immunoglobulin A: 158 mg/dL (ref 47–310)

## 2021-05-22 LAB — SPECIMEN STATUS REPORT

## 2021-05-26 ENCOUNTER — Telehealth: Payer: Self-pay

## 2021-05-26 NOTE — Telephone Encounter (Signed)
[facility] call to [facility]. ?Need to discuss Ocrevus.  ?

## 2021-06-18 ENCOUNTER — Other Ambulatory Visit: Payer: Self-pay

## 2021-06-18 ENCOUNTER — Emergency Department: Payer: 59

## 2021-06-18 ENCOUNTER — Emergency Department
Admission: EM | Admit: 2021-06-18 | Discharge: 2021-06-18 | Disposition: A | Discharge status: Home / Self Care | Payer: 59 | Attending: Emergency Medicine | Admitting: Emergency Medicine

## 2021-06-18 DIAGNOSIS — M25551 Pain in right hip: Secondary | ICD-10-CM | POA: Insufficient documentation

## 2021-06-18 DIAGNOSIS — W19XXXA Unspecified fall, initial encounter: Secondary | ICD-10-CM

## 2021-06-18 DIAGNOSIS — W101XXA Fall (on)(from) sidewalk curb, initial encounter: Secondary | ICD-10-CM | POA: Diagnosis not present

## 2021-06-18 MED ORDER — PREDNISONE 50 MG PO TABS: 50.0000 mg | ORAL_TABLET | Freq: Every day | ORAL | 0 refills | Status: AC

## 2021-06-18 MED ORDER — MELOXICAM 15 MG PO TABS: 15.0000 mg | ORAL_TABLET | Freq: Every day | ORAL | 0 refills | Status: AC

## 2021-06-18 NOTE — ED Notes (Signed)
Pt comes with c/o mechanical fall and now having right hip pain. Pt states he heard a pop. Pt states unable to bear weight on leg.

## 2021-06-18 NOTE — ED Triage Notes (Signed)
Pt here with a fall yesterday. Pt has MS and has an appt for prior neck fusions. Pt fell off a curb yesterday and hurt his right hip. Pt would like medication for his MS. Pt denies N/V/D.

## 2021-06-18 NOTE — Discharge Instructions (Addendum)
-  You may treat pain with Tylenol/meloxicam as needed.  -You may take the prednisone for your MS flare  -Follow-up with your primary care provider in the next few days, as discussed  -Return to the emergency department anytime if you begin to experience any new or worsening symptoms

## 2021-06-18 NOTE — ED Provider Notes (Signed)
"  [facility] Provider Note    Event Date/Time   First MD Initiated Contact with Patient 06/18/21 1102     (approximate)   History   Chief Complaint Fall   HPI Joshua [name] is a 51 y.o. male, history of cyst, substance abuse, cervical fusion syndrome, anxiety/depression, presents to the emergency department for evaluation of injury sustained from fall.  Patient states he fell on his right side yesterday after tripping on a curb.  Denies any head injury.  Reports feeling pain along the right side of his hip extending down to his thigh.  Patient still able to ambulate on his own without assistance.  Additionally, patient states he is currently going through a MS flare at this time and is having body aches all over.  He is requesting prednisone for this.  Denies blood thinner use.  Denies chest pain, shortness of breath, fever/chills, flank pain, nausea/vomiting, diarrhea, dysuria, rash/lesions, or numbness/tingling in upper or lower extremities.  History Limitations: No limitations        Physical Exam  Triage Vital Signs: ED Triage Vitals  Enc Vitals Group     BP 06/18/21 0948 115/72     Pulse Rate 06/18/21 0948 75     Resp 06/18/21 0948 18     Temp 06/18/21 0948 98.4 F (36.9 C)     Temp Source 06/18/21 0948 Oral     SpO2 06/18/21 0948 98 %     Weight 06/18/21 0948 139 lb 1.8 oz (63.1 kg)     Height 06/18/21 0948 5\' 7"  (1.702 m)     Head Circumference --      Peak Flow --      Pain Score 06/18/21 0947 10     Pain Loc --      Pain Edu? --      Excl. in GC? --     Most recent vital signs: Vitals:   06/18/21 0948  BP: 115/72  Pulse: 75  Resp: 18  Temp: 98.4 F (36.9 C)  SpO2: 98%    General: Awake, NAD.  Skin: Warm, dry. No rashes or lesions.  Eyes: PERRL. Conjunctivae normal.  CV: Good peripheral perfusion.  Resp: Normal effort.  Abd: Soft, non-tender. No distention.  Neuro: At baseline. No gross neurological deficits.    Focused Exam: No gross deformities to the right lower extremity.  Endorses mild-moderate tenderness when palpating the hip and thigh region.  He has neurovascularly intact.  Normal range of motion at the hip, knee, and ankle.  Physical Exam    ED Results / Procedures / Treatments  Labs (all labs ordered are listed, but only abnormal results are displayed) Labs Reviewed - No data to display   EKG N/A.   RADIOLOGY  ED Provider Interpretation: I personally viewed and interpreted this image, no evidence of fracture or dislocation.  No results found.  PROCEDURES:  Critical Care performed: N/A.  Procedures    MEDICATIONS ORDERED IN ED: Medications - No data to display   IMPRESSION / MDM / ASSESSMENT AND PLAN / ED COURSE  I reviewed the triage vital signs and the nursing notes.                              Differential diagnosis includes, but is not limited to, pelvic fracture, femur fracture, musculoskeletal strain.  ED Course Patient appears well, vitals within normal limits.  NAD.  Assessment/Plan Patient presents for  evaluation of injury to the right hip/right lower extremity sustained from mechanical fall.  No evidence of fractures or dislocations on plain film.  Low suspicion for any occult injuries warranting advanced imaging.  Likely musculoskeletal strain.  He is still able to ambulate on his own.  Will provide patient with prescription for meloxicam.  In regards to the patient's MS flare, I will prescribe him with a short prednisone burst.  Patient has a follow-up appoint with his primary care provider tomorrow.  Encouraged him to follow-up with them.  We will plan to discharge.  Provided the patient with anticipatory guidance, return precautions, and educational material. Encouraged the patient to return to the emergency department at any time if they begin to experience any new or worsening symptoms. Patient expressed understanding and agreed with the plan.        FINAL CLINICAL IMPRESSION(S) / ED DIAGNOSES   Final diagnoses:  Fall, initial encounter     Rx / DC Orders   ED Discharge Orders          Ordered    predniSONE (DELTASONE) 50 MG tablet  Daily with breakfast        06/18/21 1158    meloxicam (MOBIC) 15 MG tablet  Daily        06/18/21 1158             Note:  This document was prepared using Dragon voice recognition software and may include unintentional dictation errors.   Varney Daily, [place] 06/19/21 1126    Concha Se, MD 06/19/21 1134

## 2021-06-23 ENCOUNTER — Telehealth: Payer: Self-pay

## 2021-06-23 NOTE — Telephone Encounter (Signed)
Restart Ocrevus for patient, Patient was advised he has to keep his infusions on time. If not they will have discuss a new plan or medication for MS.  Deniece Ree will start a new Verbal order for patient to start the Ocrevus from his original start date in Jan. 2023. So his appt 07/17/21 is 6 months from then.   Advised Judithann Sauger is out of the office until next Tuesday he will sign the order that will be fax over then.

## 2021-07-02 ENCOUNTER — Telehealth: Payer: Self-pay | Admitting: Neurology

## 2021-07-02 NOTE — Telephone Encounter (Signed)
Pt called in and left a message. He is supposed to have an MRI done this Sunday. He is afraid he won't be able to lay still due to his neck injury and doesn't want to waste anyone's time. He would like to see if he could be prescribed something to be able to help him with that? He says [facility] [place].

## 2021-07-03 ENCOUNTER — Other Ambulatory Visit: Payer: Self-pay | Admitting: Neurology

## 2021-07-03 MED ORDER — DIAZEPAM 5 MG PO TABS: ORAL_TABLET | ORAL | 0 refills | Status: DC

## 2021-07-03 NOTE — Telephone Encounter (Signed)
Pt called an informed Script for diazepam sent.  Causes drowsiness so he must have a driver to and from the MRI facility. Pt stated his wife is going to drive him

## 2021-07-05 ENCOUNTER — Ambulatory Visit
Admission: RE | Admit: 2021-07-05 | Discharge: 2021-07-05 | Disposition: A | Discharge status: Home / Self Care | Payer: 59 | Source: Ambulatory Visit | Attending: Neurology | Admitting: Neurology

## 2021-07-05 DIAGNOSIS — G894 Chronic pain syndrome: Secondary | ICD-10-CM

## 2021-07-05 DIAGNOSIS — G35 Multiple sclerosis: Secondary | ICD-10-CM

## 2021-07-05 DIAGNOSIS — Q761 Klippel-Feil syndrome: Secondary | ICD-10-CM

## 2021-07-05 MED ORDER — GADOBENATE DIMEGLUMINE 529 MG/ML IV SOLN
14.0000 mL | Freq: Once | INTRAVENOUS | Status: AC | PRN
  Administered 2021-07-05: 14 mL via INTRAVENOUS

## 2021-07-07 ENCOUNTER — Telehealth: Payer: Self-pay

## 2021-07-07 ENCOUNTER — Telehealth: Payer: Self-pay | Admitting: Neurology

## 2021-07-07 NOTE — Telephone Encounter (Signed)
Pt called in after getting a missed call.

## 2021-07-07 NOTE — Telephone Encounter (Signed)
Per christy patient wanted to speak to [facility].  Telephone call to patient, Per Patient he wanted to know how to get started on Disability. He was advised by his pcp to go file.  He doesn't know what to do he been in the hospital for MS flares. Patient C/o Fatigue,Pain all over,Depression and Tingling in his arms and hands.  Advised patient, to contact [employer] for Disability. I can't say if Dr.Jaffe will fill it out or not. Just send it over and we will give it to [facility] to review. It will cost $25 . [facility] infusion and to schedule an appt.  Patient Verbalized Understanding.

## 2021-07-07 NOTE — Telephone Encounter (Signed)
-----   Message from Pieter Partridge, DO sent at 07/07/2021  7:42 AM EDT ----- MRI is stable.

## 2021-09-03 ENCOUNTER — Ambulatory Visit: Payer: Self-pay | Admitting: Neurosurgery

## 2021-09-14 ENCOUNTER — Telehealth (HOSPITAL_COMMUNITY): Payer: Self-pay | Admitting: Pharmacy Technician

## 2021-09-14 ENCOUNTER — Other Ambulatory Visit (HOSPITAL_COMMUNITY): Payer: Self-pay

## 2021-09-14 ENCOUNTER — Encounter: Payer: Self-pay | Admitting: Oncology

## 2021-09-14 ENCOUNTER — Telehealth: Payer: Self-pay

## 2021-09-14 NOTE — Telephone Encounter (Signed)
Letter received from [facility], [place] needed for Ocrevus.

## 2021-09-14 NOTE — Telephone Encounter (Signed)
Patient Advocate Encounter   Received notification that prior authorization for Ocrevus 300MG /10ML solution is required.   PA submitted on 09/14/2021 Key 09/16/2021 Status is pending       [id number], CPhT Pharmacy Patient Advocate Specialist [facility] Pharm[facility] Patient Advocate Team Direct Number: [phone]  Fax: [phone]

## 2021-09-15 ENCOUNTER — Other Ambulatory Visit (HOSPITAL_COMMUNITY): Payer: Self-pay

## 2021-09-15 NOTE — Telephone Encounter (Signed)
Pharmacy Benefits Denied to Approve  Has been submitted through medical to [facility] 10/15/2021.  He still has approval that is good through 10/14/2021.  Case Id # [id number]  Roland Earl, CPHT Pharmacy Patient Advocate Specialist [facility] Pharm[facility] Patient Advocate Team Direct Number: [phone]  Fax: [phone]

## 2021-09-22 ENCOUNTER — Telehealth: Payer: Self-pay

## 2021-09-22 NOTE — Telephone Encounter (Signed)
Riley with [employer] called for an update on the forms.

## 2021-09-22 NOTE — Telephone Encounter (Signed)
Disability forms received 09/15/21, Per Dr.Jaffe for the portion of the form ask about memory patient will need to do Neuropsych testing.   Patient advised. Next appt not until next year. Patient okay with that. Will have front desk to call patient to schedule.

## 2021-09-22 NOTE — Telephone Encounter (Signed)
LMOVm patient advised to give the provider at least two weeks to fill ou the forms and Per Dr.Jaffe patient needs to have Neuropsych testing.

## 2021-09-23 ENCOUNTER — Telehealth: Payer: Self-pay

## 2021-09-23 NOTE — Telephone Encounter (Signed)
-----   Message from Rockey Situ sent at 09/23/2021  2:03 PM EDT ----- He agreed to the letter and for [place] to call him when ready. He wants to pick it up. ----- Message ----- From: Aretha Parrot, CMA Sent: 09/23/2021  12:30 PM EDT To: Rockey Situ  Dr. Myer Haff has reviewed his FCE from [facility]. The FCE says that he is not able to perform his current job and so Dr. Myer Haff wanted to make sure he is okay with [place] putting that in a letter before we type this up. Do we need to fax this some where?

## 2021-09-23 NOTE — Telephone Encounter (Signed)
He will call back with a fax number.

## 2021-09-23 NOTE — Telephone Encounter (Signed)
I have typed up the letter, can you please let him know.

## 2021-09-24 NOTE — Telephone Encounter (Signed)
The fax number that he patient called back with will not go through. He will pick up the letter.

## 2021-10-09 ENCOUNTER — Ambulatory Visit: Payer: 59 | Admitting: Neurology

## 2021-10-09 NOTE — Telephone Encounter (Signed)
2nd message left, Per Dr.Jaffe paperwork will request cognitive questions. Patient will need a Neuropysh testing which patient is aware.

## 2021-10-09 NOTE — Telephone Encounter (Signed)
Riley with [employer] called to check on the status of the forms.

## 2021-10-12 NOTE — Progress Notes (Unsigned)
NEUROLOGY FOLLOW UP OFFICE NOTE  Joshua [name] [id number]  Assessment/Plan:   Multiple sclerosis Cervical fusion syndrome Chronic pain syndrome Spinal myoclonus Chronic fatigue     DMT:  Will continue Ocrevus.  Discussed importance of taking it on time.  Advised to reschedule next infusion at time of current infusion Check quantitative immunoglobulin panel and vit D now and in 6 months. Repeat MRI brain/c-spine with and without contrast in 6 months. Nortriptyline 50mg  QHS and modafinil 100mg  daily Start levetiracetam 250mg  twice daily for treatment of spinal myoclonus Continue follow up with pain management Follow up 6 months.   Subjective:  Joshua [name] is a 51 year old left-handed African American male s/p C5-7 and C3-4 ACDF who follows up for multiple sclerosis.    UPDATE: Current DMT:  Ocrevus. Other medications:  modafinil 100mg  daily, nortriptyline 50mg  QHS D3 4000 IU daily  05/21/2021 LABS:  CBC with WBC 8.2, HGB 14.7, HCT 43.5; CMP with Na 138, K 3.9, Cl 103, CO2 29, glucose 83, BUN 7, Cr 0.79, t bili 0.3, ALP 59, AST 18, ALT 14; IgA 158, KgG 1193, IgM 48; vit D 25.43.  advised to start D3 4000 IU daily.   Imaging personally reviewed: 07/06/2021 MRI BRAIN & C-SPINE W WO:  Unchanged lesions involving the cerebral white matter and cervical spinal cord consistent with multiple sclerosis. No evidence of active demyelination.   Vision:  No issues Motor:  No weakness.  Sometimes his legs may jerk.  Also body jerks as well. Sensory:  Paresthesias involving all extremities.  NCV-EMG from 05/27/2014 showed right ulnar neuropathy Pain:  Chronic diffuse pain.  Chronic neck pain s/p ACDF.  No improvement in pain despite surgery.  Chronic back pain.  Chronic chest pain.  Referred to pain management, Dr. .  He is in constant pain.  It is difficult for him to function.  He was denied disability.  Thinking about reapplying.   Gait:  Unsteady gait.   Bowel/Bladder:  No  issues Fatigue:  Daytime fatigue.  Poor sleep Cognition:  Reports short term memory deficits.  He forgets things to happened the previous day.  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  Mood:  Depressed.  Trouble sleeping. Erectile dysfunction   HISTORY:  Initially presented with hand numbness and diffuse pain.  MRI of brain and cervical cord at the time revealed numerous periventricular and cervical spinal cord lesions.  He did not follow up with neurology and continued to experience diffuse pain, arm and hand numbness, unsteady gait with falls and "MS hug".   Other pertinent history: 2005 Subdural hematoma: "spontaneous bleed" in setting of cocaine use and high blood pressure s/p surgery on the left skull region.  Multiple cervical spine surgeries with chronic neck pain.  07/12/2013 ACDF C4-5 and C6-7.  Headaches resolved.  Still with neck pain. Last surgery was February 2022. Right ulnar neuropathy on NCV-EMG 05/27/2014. History of alcoholism and drug addiction.   Family History:  Sister (diagnosed with MS in her 3s)  Past disease modifying therapy:  Tecfidera (reports that it didn't work) Past medications:  Cymbalta 30mg  daily; gabapentin, Lyrica, Flexeril, paroxetine   Imaging: 11/13/2009 MRI BRAIN W WO:  Multiple periventricular, deep and subcortical white matter lesions, including characteristic perpendicular lesion adjacent to the right ventricle. 11/13/2009 MRI CERVICAL & THORACIC SPINE W WO:  T2/STIR hyperintense lesions in the cervical medullary junction, C5, T1-2, T7-8, and T9  vertebral levels without evidence of  enhancement.  Multilevel degenerative changes of the cervical spine. 07/07/2010 MRI CERVICAL SPINE W WO (personally reviewed):  Nonenhancing lesions at C1 and C5 levels. 01/04/2012 MRI BRAIN W WO (personally reviewed):  Two tiny nonspecific nonenhancing hyperintense foci in subcortical white matter. 05/17/2013 MRI CERVICAL SPINE  WO:  Multiple nonenhancing lesions within spinal cord, reportedly stable compared to imaging from 07/07/2010.  Spondylosis with right paracentral disc herniation without neural compression at C3-4, broad based disc protrusion with bilateral neural foraminal stenosis compressing C7 nerve roots at C6-7, and left-sided facet arthropathy with edema at C7-T1 09/11/2013 MRI CERVICAL SPINE WO:  S/p ACDF at C4-5 and C6-7, increased disc herniation and spinal stenosis at C3-4, chronjic left facet arthropathy at C7-T1. 06/07/2014 MRI BRAIN W WO: Multiple T2 hyperintense lesions within periventricular and subcortical white matter without abnormal enhancement. 06/07/2014 MRI BRAIN W WO: Multiple T2 hyperintense peripheral cervical spinal cord lesions from the level of the C1 arch to the T2 level without abnormal enhancement.  Status post C4-C7 ACDF.  Small disc bulge at C3-C4 resulting in mild spinal canal stenosis.  02/13/2019 MRI BRAIN W WO:  1. Mild patchy T2/FLAIR signal abnormality involving the periventricular and juxta cortical supratentorial cerebral white matter, consistent with multiple sclerosis. Overall, appearance is mildly progressed relative to 2013. No evidence for active demyelination.  2. Underlying mildly progressed cerebral atrophy for age.  3. Chronic right maxillary sinusitis. 02/13/2019 MRI CERVICAL SPINE W WO:  1. Patchy multifocal cord signal abnormality involving the cervical spinal cord as above, consistent with history of multiple sclerosis.  Overall, appearance is minimally progressed relative to 2015. No evidence for active demyelination.  2. Prior ACDF at C4 through C7 without residual stenosis.  3. Adjacent segment disease with central disc protrusion at C3-4 indenting the ventral thecal sac with resultant moderate spinal stenosis and moderate right and mild left C4 foraminal stenosis. 10/20/2019 MRI BRAIN W WO: Stable 10/20/2019 MRI CERVICAL SPINE W WO:  Stable.  Moderate spinal stenosis at  C3-4.  No spinal stenosis at fused C5-7 levels.  Small disc central disc protrusion indenting ventral thecal sac at C2-3 without significant stenosis.   06/16/2020 MRI C-SPINE W WO:  Chronic demyelinating lesions C2 and C5, consistent with history of multiple sclerosis. No new lesions.  Prior C3-C5 and C6-C7 ACDF. Unchanged adjacent segment disease at C5-C6 with moderate bilateral neuroforaminal stenosis. 09/27/2020 MRI BRAIN W WO:  1. No significant interval change in distribution and number of chronic demyelinating lesions involving the supratentorial cerebral white matter. No new lesions or evidence for active demyelination. 09/27/2020 MRI C-SPINE W WO:  1. Chronic demyelinating lesions at C2, C5, and within the upper thoracic cord, stable. No new lesions to suggest disease progression or active demyelination.  2. Prior ACDF at C3-C5 and C6-7. Adjacent segment disease at C5-6 with mild left greater than right C5 foraminal narrowing, stable.   Labs: 04/22/2014 positive JC Virus Ab with index 1.28 Reportedly underwent lumbar puncture for CSF analysis, revealing 10 oligoclonal bands.  PAST MEDICAL HISTORY: Past Medical History:  Diagnosis Date   Allergy    Anxiety    Depression    Multiple sclerosis (HCC)    Neck pain    Neuromuscular disorder (HCC)     MEDICATIONS: Current Outpatient Medications on File Prior to Visit  Medication Sig Dispense Refill   acetaminophen (TYLENOL) 500 MG tablet Take 2 tablets (1,000 mg total) by mouth every 6 (six) hours as needed for mild pain or moderate pain. 30 tablet 0  diazepam (VALIUM) 5 MG tablet Take 1 tablet 30-40 minutes prior to MRI.  May repeat dose at MRI facility if needed.  Caution for drowsiness.  Must have a driver to and from the MRI facility 2 tablet 0   levETIRAcetam (KEPPRA) 250 MG tablet Take 1 tablet (250 mg total) by mouth 2 (two) times daily. 60 tablet 5   modafinil (PROVIGIL) 100 MG tablet Take 1 tablet (100 mg total) by mouth in  the morning. 30 tablet 2   nortriptyline (PAMELOR) 50 MG capsule Take 1 capsule (50 mg total) by mouth at bedtime. 30 capsule 5   No current facility-administered medications on file prior to visit.    ALLERGIES: Allergies  Allergen Reactions   Penicillins Hives    As a child     FAMILY HISTORY: Family History  Problem Relation Age of Onset   Hypertension Mother    Hypertension Father    Multiple sclerosis Sister    Aneurysm Brother       Objective:  *** General: No acute distress.  Patient appears ***-groomed.   Head:  Normocephalic/atraumatic Eyes:  Fundi examined but not visualized Neck: supple, no paraspinal tenderness, full range of motion Heart:  Regular rate and rhythm Lungs:  Clear to auscultation bilaterally Back: No paraspinal tenderness Neurological Exam: alert and oriented to person, place, and time.  Speech fluent and not dysarthric, language intact.  CN II-XII intact. Tone slightly increased, muscle strength 5/5 throughout.  Sensation to light touch intact.  Deep tendon reflexes 2+ throughout except 3+ in knees, toes downgoing.  Finger to nose testing intact.  Slight broad-based gait, Romberg with sway.   Shon Millet, DO  CC: ***

## 2021-10-13 NOTE — Telephone Encounter (Signed)
Riley from [employer] called again. Stated they have paid, its been a month and they still have not received the forms. They need it faxed no later than this week.

## 2021-10-13 NOTE — Telephone Encounter (Signed)
The following message was left with [employer] on 10/12/21 at 4:49 PM.  Victory Dakin from [email or web address], disability services, says they faxed a med request form and needs to have it returned. Also to let them know they made a payment.

## 2021-10-14 ENCOUNTER — Ambulatory Visit (INDEPENDENT_AMBULATORY_CARE_PROVIDER_SITE_OTHER): Payer: 59 | Admitting: Neurology

## 2021-10-14 ENCOUNTER — Encounter: Payer: Self-pay | Admitting: Neurology

## 2021-10-14 VITALS — BP 122/70 | HR 97

## 2021-10-14 DIAGNOSIS — Q761 Klippel-Feil syndrome: Secondary | ICD-10-CM | POA: Diagnosis not present

## 2021-10-14 DIAGNOSIS — G253 Myoclonus: Secondary | ICD-10-CM | POA: Diagnosis not present

## 2021-10-14 DIAGNOSIS — G35 Multiple sclerosis: Secondary | ICD-10-CM | POA: Diagnosis not present

## 2021-10-14 NOTE — Patient Instructions (Addendum)
Continue Ocrevus, modafinil, nortriptyline, levetiracetam, D3 4000 IU daily Check CBC with diff, CMP, immunoglobulin panel and vit D Follow up 6 months.

## 2021-10-14 NOTE — Telephone Encounter (Signed)
Riley from [place] called again about the forms. She states they have already paid the form fee and needs an update on when the forms will be filled out.

## 2021-10-15 NOTE — Telephone Encounter (Signed)
Paperwork faxed, and scanned to the chart.

## 2021-10-27 ENCOUNTER — Ambulatory Visit: Payer: Self-pay | Admitting: Neurosurgery

## 2021-11-05 ENCOUNTER — Ambulatory Visit (INDEPENDENT_AMBULATORY_CARE_PROVIDER_SITE_OTHER): Payer: 59 | Admitting: Neurosurgery

## 2021-11-05 ENCOUNTER — Encounter: Payer: Self-pay | Admitting: Neurosurgery

## 2021-11-05 ENCOUNTER — Encounter: Payer: Self-pay | Admitting: Oncology

## 2021-11-05 VITALS — BP 106/81 | HR 75 | Ht 67.0 in | Wt 124.4 lb

## 2021-11-05 DIAGNOSIS — G894 Chronic pain syndrome: Secondary | ICD-10-CM

## 2021-11-05 DIAGNOSIS — G35 Multiple sclerosis: Secondary | ICD-10-CM

## 2021-11-05 DIAGNOSIS — M542 Cervicalgia: Secondary | ICD-10-CM
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
## 2021-11-23 ENCOUNTER — Ambulatory Visit: Payer: 59 | Admitting: Neurology

## 2021-11-25 ENCOUNTER — Inpatient Hospital Stay: Payer: 59 | Attending: Internal Medicine

## 2021-11-25 ENCOUNTER — Inpatient Hospital Stay: Payer: 59

## 2021-11-25 NOTE — Progress Notes (Addendum)
Nutrition Assessment   Reason for Assessment:  NO recent referral from provider Last seen by Dr B on 11/21/2020, no future appointments with provider   ASSESSMENT:  51 year old male with anemia and multiple sclerosis.    RD saw patient on 08/20/20 after referral from Sonia Baller, NP at [facility].   Follow-up on 10/15/2020 was no show Follow-up on 12/05/2020 was a no show  Patient on RD  schedule for phone call.  ?? Error.  Called patient and says that he requested referral to a nutritionist from his PCP Threasa Alpha because he can't gain weight. Wants to know if there is a pill he can take to increase his appetite.     Anthropometrics:   Height: 67 inches Weight: 124 lb 6.4 oz 139 lb on 06/18/2021 BMI: 19    INTERVENTION:  Patient is currently not being followed by MD at the cancer center and would be best served by outpatient RD at [facility] in [place] for his concerns regarding weight loss. Informed him of this and will follow-up with PCP Will mail High Calorie, High protein Diet handout. Discussed appetite stimulant medications but that PCP would need to decide if those medications were appropriate for him to try.  Will mail coupons for ensure.  RD called [facility], office and left message regarding above and left contact number for PCP if has further questions.     Next Visit: no follow-up  Jesica Goheen B. Zenia Resides, [place], [place] Registered Dietitian [phone]

## 2021-11-25 NOTE — Progress Notes (Unsigned)
See progress note.

## 2021-11-26 ENCOUNTER — Other Ambulatory Visit: Payer: Self-pay | Admitting: Neurology

## 2021-12-18 ENCOUNTER — Other Ambulatory Visit: Payer: Self-pay | Admitting: Neurology

## 2021-12-18 ENCOUNTER — Other Ambulatory Visit: Payer: Self-pay

## 2022-02-04 ENCOUNTER — Other Ambulatory Visit: Payer: Self-pay | Admitting: Neurology

## 2022-03-04 ENCOUNTER — Other Ambulatory Visit: Payer: Self-pay | Admitting: Neurology

## 2022-03-17 ENCOUNTER — Ambulatory Visit: Payer: 59 | Admitting: Urology

## 2022-03-17 ENCOUNTER — Encounter: Payer: Self-pay | Admitting: Oncology

## 2022-03-17 VITALS — BP 108/60 | HR 74 | Ht 67.0 in | Wt 145.0 lb

## 2022-03-17 DIAGNOSIS — R39198 Other difficulties with micturition: Secondary | ICD-10-CM | POA: Diagnosis not present

## 2022-03-17 DIAGNOSIS — R300 Dysuria: Secondary | ICD-10-CM | POA: Diagnosis not present

## 2022-03-17 DIAGNOSIS — F5232 Male orgasmic disorder: Secondary | ICD-10-CM | POA: Diagnosis not present

## 2022-03-17 LAB — BLADDER SCAN AMB NON-IMAGING: Scan Result: 20

## 2022-03-17 MED ORDER — SULFAMETHOXAZOLE-TRIMETHOPRIM 800-160 MG PO TABS: 1.0000 | ORAL_TABLET | Freq: Two times a day (BID) | ORAL | 0 refills | Status: DC

## 2022-03-17 NOTE — Progress Notes (Signed)
   03/17/22 4:01 PM   Joshua [name] 1970-02-29 [id number]  CC: Urinary symptoms, dysuria, difficulty with ejaculation  HPI: 52 year old male with history of multiple sclerosis here for the above issues.  He reports at least a few months of dysuria and urinary symptoms with frequency and difficulty urinating.  He was started on Flomax by his PCP which she feels has helped his flow.  He has numerous sexual partners.  No recent STD testing.  He denies any problems with erections, but has difficulty reaching orgasm.  Urinalysis today appears grossly infected with greater than 30 WBC, 3-10 RBC, many bacteria, nitrite positive, 1+ leukocyte.  PVR normal at 71ml.  He also has questions about prostate cancer screening.   PMH: Past Medical History:  Diagnosis Date   Allergy    Anxiety    Depression    Multiple sclerosis ([place])    Neck pain    Neuromuscular disorder [facility])     Surgical History: Past Surgical History:  Procedure Laterality Date   ANTERIOR CERVICAL DECOMP/DISCECTOMY FUSION N/A 02/20/2020   Procedure: ANTERIOR CERVICAL DECOMPRESSION/DISCECTOMY FUSION 1 LEVEL C3-4;  Surgeon: Meade Maw, MD;  Location: [facility] ORS;  Service: Neurosurgery;  Laterality: N/A;   CRANIOPLASTY  2005   subdural hematoma  that had to be removed   NECK SURGERY  2014   SPINAL FUSION       Family History: Family History  Problem Relation Age of Onset   Hypertension Mother    Hypertension Father    Multiple sclerosis Sister    Aneurysm Brother     Social History:  reports that he has been smoking cigarettes. He has a 20.00 pack-year smoking history. He has never used smokeless tobacco. He reports current alcohol use. He reports current drug use. Frequency: 7.00 times per week. Drug: Marijuana.  Physical Exam: BP 108/60   Pulse 74   Ht 5\' 7"  (1.702 m)   Wt 145 lb (65.8 kg)   BMI 22.71 kg/m    Constitutional:  Alert and oriented, No acute distress. Cardiovascular: No clubbing,  cyanosis, or edema. Respiratory: Normal respiratory effort, no increased work of breathing. GI: Abdomen is soft, nontender, nondistended, no abdominal masses  Assessment & Plan:   52 year old male here for a number of urologic issues today.  In terms of his dysuria, urinalysis today appears grossly infected on I recommended Bactrim DS twice daily x 2 weeks for UTI/prostatitis.  Will also send for STDs and atypical cultures and follow-up those results.  Regarding his difficulty with ejaculations, this could be related to his nortriptyline, but he denies any problems with erections.  We reviewed there are no medications that can help him achieve orgasm quicker, but he could consider stopping the nortriptyline if cleared by the prescriber.  Regarding PSA screening, we reviewed the risks and benefits at length and he is interested in pursuing screening.  I recommended deferring a PSA today in the setting of active infection, but could consider a repeat PSA and follow-up in 3 months.  -Bactrim DS twice daily x 2 weeks for acute UTI, follow-up cultures and STD testing -Consider stopping nortriptyline to see if this improves his difficulty reaching orgasm -RTC 3 months symptom check, PSA at that time   Nickolas Madrid, MD 03/17/2022  [facility] [address] [place], [place] [address] [phone]

## 2022-03-18 ENCOUNTER — Ambulatory Visit: Payer: Self-pay | Admitting: Family Medicine

## 2022-03-18 ENCOUNTER — Encounter: Payer: Self-pay | Admitting: Family Medicine

## 2022-03-18 DIAGNOSIS — Z113 Encounter for screening for infections with a predominantly sexual mode of transmission: Secondary | ICD-10-CM

## 2022-03-18 LAB — MICROSCOPIC EXAMINATION: WBC, UA: >30 /hpf — AB (ref 0–5)

## 2022-03-18 LAB — URINALYSIS, COMPLETE
Bilirubin, UA: NEGATIVE
Glucose, UA: NEGATIVE
Ketones, UA: NEGATIVE
Nitrite, UA: POSITIVE — AB
Protein,UA: NEGATIVE
Specific Gravity, UA: 1.02 (ref 1.005–1.030)
Urobilinogen, Ur: 0.2 mg/dL (ref 0.2–1.0)
pH, UA: 6 (ref 5.0–7.5)

## 2022-03-18 NOTE — Progress Notes (Signed)
Pt arrived in clinic for STI testing. Reviewed chart- pt had testing yesterday for GC/Chlamydia and UTI. Given abx by MD for UTI treatment. Offered to do bloodwork for HIV and syphilis. Pt declined. Pt left office.  [facility] FNP-C

## 2022-03-21 LAB — GC/CHLAMYDIA PROBE AMP
Chlamydia trachomatis, NAA: NEGATIVE
Neisseria Gonorrhoeae by PCR: NEGATIVE

## 2022-03-23 LAB — MYCOPLASMA / UREAPLASMA CULTURE
Mycoplasma hominis Culture: NEGATIVE
Ureaplasma urealyticum: NEGATIVE

## 2022-03-23 LAB — CULTURE, URINE COMPREHENSIVE

## 2022-04-06 ENCOUNTER — Ambulatory Visit: Payer: 59 | Admitting: Neurosurgery

## 2022-04-06 NOTE — Progress Notes (Unsigned)
Referring Physician:  No referring provider defined for this encounter.  Primary Physician:  Joshua Haggard, FNP  History of Present Illness: 04/08/2022 [name] has a history of MS.   He had ACDF C3-C4 on 02/20/20 by Dr. Izora [name] and C4-C7 cervical fusion by Dr. Hal [name] in 2014.   Last seen by Dr. Izora [name] on 11/05/21 and he was complaining of neck pain and symptoms of MS. Dr. Izora [name] kept him out of work and agreed with him being on disability.   4 month history of increased neck pain that is posterior. Some radiation into his shoulders and down into his shoulder blades. No new arm pain. He has numbness and tingling in both arms.   He is scheduled for carpal tunnel release and ulnar nerve release on left, then 4-6 weeks later he will have the right side done.    Conservative measures:  Physical therapy: has participated in, but years ago.  Multimodal medical therapy including regular antiinflammatories: tylenol, ibuprofen, topical lidocaine, flexeril, lyrica, prednisone Injections:  07/31/20 left C2-C3 and C5-C6 facet injections Alba Destine) 09/05/19: C6-7 ESI (Dr. Holley Raring) helped for 2-3 days 07/18/19: C6-7 ESI(Dr. Holley Raring) helped for 2-3 days  Past Surgery:  ACDF C3-C4 on 02/20/20 by Dr. Izora [name] 2014 C4-C7 cervical fusion by Dr. Hal [name]   Review of Systems:  A 10 point review of systems is negative, except for the pertinent positives and negatives detailed in the HPI.  Past Medical History: Past Medical History:  Diagnosis Date   Allergy    Anxiety    Depression    Multiple sclerosis ([place])    Neck pain    Neuromuscular disorder ([place])     Past Surgical History: Past Surgical History:  Procedure Laterality Date   ANTERIOR CERVICAL DECOMP/DISCECTOMY FUSION N/A 02/20/2020   Procedure: ANTERIOR CERVICAL DECOMPRESSION/DISCECTOMY FUSION 1 LEVEL C3-4;  Surgeon: Meade Maw, MD;  Location: [facility] ORS;  Service: Neurosurgery;  Laterality: N/A;   CRANIOPLASTY   2005   subdural hematoma  that had to be removed   NECK SURGERY  2014   SPINAL FUSION      Allergies: Allergies as of 04/08/2022 - Review Complete 03/18/2022  Allergen Reaction Noted   Penicillins Hives 02/12/2015    Medications: Outpatient Encounter Medications as of 04/08/2022  Medication Sig   modafinil (PROVIGIL) 100 MG tablet TAKE 1 TABLET BY MOUTH IN THE MORNING   predniSONE (DELTASONE) 20 MG tablet Take 20 mg by mouth 2 (two) times daily.   sulfamethoxazole-trimethoprim (BACTRIM DS) 800-160 MG tablet Take 1 tablet by mouth 2 (two) times daily.   tamsulosin (FLOMAX) 0.4 MG CAPS capsule Take 0.4 mg by mouth daily.   [DISCONTINUED] nortriptyline (PAMELOR) 50 MG capsule Take 1 capsule (50 mg total) by mouth at bedtime. (Patient not taking: Reported on 03/18/2022)   No facility-administered encounter medications on file as of 04/08/2022.    Social History: Social History   Tobacco Use   Smoking status: Every Day    Packs/day: 1.00    Years: 20.00    Total pack years: 20.00    Types: Cigarettes   Smokeless tobacco: Never   Tobacco comments:    uses nicotine patch. trying to quit  Vaping Use   Vaping Use: Never used  Substance Use Topics   Alcohol use: Yes    Comment: occasionally   Drug use: Yes    Frequency: 7.0 times per week    Types: Marijuana    Family Medical History: Family History  Problem Relation Age  of Onset   Hypertension Mother    Hypertension Father    Multiple sclerosis Sister    Aneurysm Brother     Physical Examination: Vitals:   04/08/22 0933  BP: 118/70    General: Patient is well developed, well nourished, calm, collected, and in no apparent distress. Attention to examination is appropriate.  Respiratory: Patient is breathing without any difficulty.   NEUROLOGICAL:     Awake, alert, oriented to person, place, and time.  Speech is clear and fluent. Fund of knowledge is appropriate.   Cranial Nerves: Pupils equal round and reactive  to light.  Facial tone is symmetric.    Limited ROM of cervical spine with pain Mild lower posterior cervical tenderness with tenderness in paraspinals. He has tenderness in bilateral trapezial region and into medial scapular region bilaterally.   Cervical incisions well healed.   No abnormal lesions on exposed skin.   Strength: Side Biceps Triceps Deltoid Interossei Grip Wrist Ext. Wrist Flex.  R '5 5 5 5 5 5 5  '$ L '5 5 5 5 5 5 5   '$ Side Iliopsoas Quads Hamstring PF DF EHL  R '5 5 5 5 5 5  '$ L '5 5 5 5 5 5   '$ Reflexes are 2+ and symmetric at the biceps, triceps, brachioradialis, patella and achilles.   Hoffman's is absent.  Clonus is not present.   Bilateral upper and lower extremity sensation is intact to light touch.     Gait is normal.    Medical Decision Making  Imaging: MRI of cervical spine dated 07/05/21:  MRI CERVICAL SPINE FINDINGS   Alignment: Mild chronic straightening of the normal cervical lordosis. No listhesis.   Vertebrae: Prior C3-C5 and C6-7 ACDF. No fracture or suspicious marrow lesion.   Cord: Unchanged T2 hyperintense lesions in the spinal cord at C2, C5, and T1-2. No definite new or enhancing lesions.   Posterior Fossa, vertebral arteries, paraspinal tissues: Unremarkable.   Disc levels:   C2-3: Small central disc protrusion without stenosis, unchanged.   C3-4: ACDF.  No stenosis.   C4-5: ACDF. Patent spinal canal and right neural foramen. Unchanged minimal to mild residual left neural foraminal narrowing.   C5-6: Moderate to severe disc space narrowing. Broad-based posterior disc osteophyte complex result in mild bilateral neural foraminal stenosis without spinal stenosis, unchanged.   C6-7: ACDF. Patent spinal canal and right neural foramen. Unchanged mild residual left neural foraminal narrowing.   C7-T1: Moderate left facet arthrosis without stenosis, unchanged.   IMPRESSION: Unchanged lesions involving the cerebral white matter and  cervical spinal cord consistent with multiple sclerosis. No evidence of active demyelination.     Electronically Signed   By: Logan Bores M.D.   On: 07/06/2021 17:48    I have personally reviewed the images and agree with the above interpretation.  Assessment and Plan: Mr. Scavetta is a pleasant 52 y.o. male has history of ACDF C3-C4 on 02/20/20 by Dr. Izora [name] and C4-C7 cervical fusion by Dr. Hal [name] in 2014.   4 month history of increased constant neck pain that is posterior. Some radiation into his shoulders and down into his shoulder blades. No new arm pain. He has numbness and tingling in both arms.   Pain appears more myofascial in nature.   Treatment options discussed with patient and following plan made:   - Cervical xrays ordered.  - If xrays look okay, will refer to [facility] in [place].  - Will call him to make sure he is  set back up on MyChart. If so, will message him with results. If not, will do phone call visit.  - He will f/u with me in 6 weeks for recheck. He does have f/u scheduled with Dr. Izora [name] in October as well.   I spent a total of 20 minutes in face-to-face and non-face-to-face activities related to this patient's care today including review of outside records, review of imaging, review of symptoms, physical exam, discussion of differential diagnosis, discussion of treatment options, and documentation.   Thank you for involving me in the care of this patient.   Geronimo Boot PA-C Dept. of Neurosurgery

## 2022-04-08 ENCOUNTER — Ambulatory Visit (INDEPENDENT_AMBULATORY_CARE_PROVIDER_SITE_OTHER): Payer: 59 | Admitting: Orthopedic Surgery

## 2022-04-08 ENCOUNTER — Ambulatory Visit
Admission: RE | Admit: 2022-04-08 | Discharge: 2022-04-08 | Disposition: A | Discharge status: Home / Self Care | Payer: 59 | Attending: Orthopedic Surgery | Admitting: Orthopedic Surgery

## 2022-04-08 ENCOUNTER — Ambulatory Visit
Admission: RE | Admit: 2022-04-08 | Discharge: 2022-04-08 | Disposition: A | Discharge status: Home / Self Care | Payer: 59 | Source: Ambulatory Visit | Attending: Orthopedic Surgery | Admitting: Orthopedic Surgery

## 2022-04-08 VITALS — BP 118/70 | Ht 67.0 in | Wt 145.0 lb

## 2022-04-08 DIAGNOSIS — Z981 Arthrodesis status: Secondary | ICD-10-CM

## 2022-04-08 DIAGNOSIS — G894 Chronic pain syndrome: Secondary | ICD-10-CM

## 2022-04-08 DIAGNOSIS — M542 Cervicalgia: Secondary | ICD-10-CM

## 2022-04-08 NOTE — Patient Instructions (Addendum)
It was so nice to see you today. Thank you so much for coming in.    I ordered xrays of your neck. You can go across the street to get these at [facility] (building with the white pillars). You do not need any appointment.   Once I review the xray results, I will likely order PT for you at [facility].   Once I get xray results back, we will call you to see if you are set up with MyChart. If so, I will send you a message. If not, we can do a phone visit to review them.   I will see you back in 6-8 weeks. Please do not hesitate to call if you have any questions or concerns. You can also message me in [place].   Geronimo Boot PA-C [phone]

## 2022-04-14 ENCOUNTER — Ambulatory Visit (INDEPENDENT_AMBULATORY_CARE_PROVIDER_SITE_OTHER): Payer: 59 | Admitting: Orthopedic Surgery

## 2022-04-14 ENCOUNTER — Encounter: Payer: Self-pay | Admitting: Orthopedic Surgery

## 2022-04-14 DIAGNOSIS — Z981 Arthrodesis status: Secondary | ICD-10-CM

## 2022-04-14 DIAGNOSIS — G894 Chronic pain syndrome: Secondary | ICD-10-CM

## 2022-04-14 NOTE — Progress Notes (Signed)
   Telephone Visit- Progress Note: Referring Physician:  No referring provider defined for this encounter.  Primary Physician:  Remi Haggard, FNP  This visit was performed via telephone.  Patient location: home Provider location: office  I spent a total of 10 minutes non-face-to-face activities for this visit on the date of this encounter including review of current clinical condition and response to treatment.    Patient has given verbal consent to this telephone visits and we reviewed the limitations of a telephone visit. Patient wishes to proceed.    Chief Complaint:  review cervical xrays  History of Present Illness: Joshua [name] is a 52 y.o. male has a history of MS.    He had ACDF C3-C4 on 02/20/20 by Dr. Izora Ribas and C4-C7 cervical fusion by Dr. Hal Neer in 2014.   Last seen by me on 04/08/22 for neck pain that radiates into his shoulders/shoulder blades.   No change in his pain.   Phone visit to review his cervical xrays.   Conservative measures:  Physical therapy: has participated in, but years ago.  Multimodal medical therapy including regular antiinflammatories: tylenol, ibuprofen, topical lidocaine, flexeril, lyrica, prednisone Injections:  07/31/20 left C2-C3 and C5-C6 facet injections Alba Destine) 09/05/19: C6-7 ESI (Dr. Holley Raring) helped for 2-3 days 07/18/19: C6-7 ESI(Dr. Holley Raring) helped for 2-3 days  Past Surgery:  ACDF C3-C4 on 02/20/20 by Dr. Izora Ribas 2014 C4-C7 cervical fusion by Dr. Hal Neer  Exam: No exam done as this was a telephone encounter.     Imaging: Cervical xrays dated 04/08/22:  FINDINGS: An anterior plate is affixed to the cervical spine at C3-4 with a disc spacer device at this level. Hardware is stable.   A disc spacer device is identified at C4-5, also stable.   An anterior plate is affixed to the cervical spine at C6-7 with a disc spacer device at this level. Hardware is in stable position. No other bony or soft tissue  abnormalities are noted.   IMPRESSION: 1. Anterior plate and disc spacer device at C3-4. Hardware is in stable position. 2. Stable disc spacer device at C4-5. 3. Stable anterior plate and disc spacer device at C6-7.     Electronically Signed   By: Dorise Bullion III M.D.   On: 04/10/2022 11:00  I have personally reviewed the images and agree with the above interpretation.  Assessment and Plan: Joshua [name] is a pleasant 52 y.o. male with a history of ACDF C3-C4 on 02/20/20 by Dr. Izora Ribas and C4-C7 cervical fusion by Dr. Hal Neer in 2014.    He continues to complain of increased constant neck pain that is posterior. Some radiation into his shoulders and down into his shoulder blades. No new arm pain. He has numbness and tingling in both arms.    Xrays show no complications.    Treatment options discussed with patient and following plan made:   - Order for physical therapy for cervical spine to [facility] in [place]. Patient to call to schedule appointment.  - Keep follow up with me in 6 weeks.  - If no improvement with above, may consider further imaging (would review with Dr. Izora Ribas).   Geronimo Boot PA-C Neurosurgery

## 2022-05-17 ENCOUNTER — Encounter: Payer: Self-pay | Admitting: Gastroenterology

## 2022-05-21 ENCOUNTER — Encounter: Payer: Self-pay | Admitting: Gastroenterology

## 2022-05-24 ENCOUNTER — Encounter: Payer: Self-pay | Admitting: Gastroenterology

## 2022-05-24 ENCOUNTER — Ambulatory Visit
Admission: RE | Admit: 2022-05-24 | Discharge: 2022-05-24 | Disposition: A | Discharge status: Home / Self Care | Payer: 59 | Attending: Gastroenterology | Admitting: Gastroenterology

## 2022-05-24 ENCOUNTER — Encounter: Payer: Self-pay | Admitting: Anesthesiology

## 2022-05-24 ENCOUNTER — Encounter
Admission: RE | Disposition: A | Discharge status: Home / Self Care | Payer: Self-pay | Source: Home / Self Care | Attending: Gastroenterology

## 2022-05-24 DIAGNOSIS — Z1211 Encounter for screening for malignant neoplasm of colon: Secondary | ICD-10-CM | POA: Diagnosis not present

## 2022-05-24 DIAGNOSIS — Z538 Procedure and treatment not carried out for other reasons: Secondary | ICD-10-CM | POA: Diagnosis not present

## 2022-05-24 HISTORY — PX: COLONOSCOPY: SHX5424

## 2022-05-24 SURGERY — COLONOSCOPY
Anesthesia: General

## 2022-05-24 MED ORDER — SODIUM CHLORIDE 0.9 % IV SOLN: INTRAVENOUS | Status: DC

## 2022-05-24 NOTE — Progress Notes (Signed)
Patient ate at 7 pm the day of prep

## 2022-05-24 NOTE — H&P (Signed)
Pt consumed full dinner last night at 1900. Stool still brown per patient/nursing report. Office will call to reschedule colonoscopy.  Enis Slipper, DO [facility]

## 2022-05-25 NOTE — Progress Notes (Deleted)
Referring Physician:  No referring provider defined for this encounter.  Primary Physician:  Armando Gang, FNP  History of Present Illness: 04/08/2022 [name] has a history of MS.   He had ACDF C3-C4 on 02/20/20 by Dr. Myer Haff and C4-C7 cervical fusion by Dr. Gerlene Fee in 2014.   Last seen by me on 04/14/22 for a phone visit. Previous cervical xrays showed no complications. I felt his pain was more muscular in nature and he was sent to PT.   He is here for follow up.           Dr. Myer Haff on 11/05/21 and he was complaining of neck pain and symptoms of MS. Dr. Myer Haff kept him out of work and agreed with him being on disability.   4 month history of increased neck pain that is posterior. Some radiation into his shoulders and down into his shoulder blades. No new arm pain. He has numbness and tingling in both arms.   He is scheduled for carpal tunnel release and ulnar nerve release on left, then 4-6 weeks later he will have the right side done.    Conservative measures:  Physical therapy: has participated in, but years ago. *** Multimodal medical therapy including regular antiinflammatories: tylenol, ibuprofen, topical lidocaine, flexeril, lyrica, prednisone Injections:  07/31/20 left C2-C3 and C5-C6 facet injections Mariah Milling) 09/05/19: C6-7 ESI (Dr. Cherylann Ratel) helped for 2-3 days 07/18/19: C6-7 ESI(Dr. Cherylann Ratel) helped for 2-3 days  Past Surgery:  ACDF C3-C4 on 02/20/20 by Dr. Myer Haff 2014 C4-C7 cervical fusion by Dr. Gerlene Fee   Review of Systems:  A 10 point review of systems is negative, except for the pertinent positives and negatives detailed in the HPI.  Past Medical History: Past Medical History:  Diagnosis Date   Allergy    Anxiety    Depression    Multiple sclerosis ([facility])    Neck pain    Neuromuscular disorder ([facility])     Past Surgical History: Past Surgical History:  Procedure Laterality Date   ANTERIOR CERVICAL DECOMP/DISCECTOMY FUSION N/A  02/20/2020   Procedure: ANTERIOR CERVICAL DECOMPRESSION/DISCECTOMY FUSION 1 LEVEL C3-4;  Surgeon: Venetia Night, MD;  Location: [facility] ORS;  Service: Neurosurgery;  Laterality: N/A;   CRANIOPLASTY  2005   subdural hematoma  that had to be removed   NECK SURGERY  2014   SPINAL FUSION      Allergies: Allergies as of 04/08/2022 - Review Complete 03/18/2022  Allergen Reaction Noted   Penicillins Hives 02/12/2015    Medications: Outpatient Encounter Medications as of 04/08/2022  Medication Sig   modafinil (PROVIGIL) 100 MG tablet TAKE 1 TABLET BY MOUTH IN THE MORNING   predniSONE (DELTASONE) 20 MG tablet Take 20 mg by mouth 2 (two) times daily.   sulfamethoxazole-trimethoprim (BACTRIM DS) 800-160 MG tablet Take 1 tablet by mouth 2 (two) times daily.   tamsulosin (FLOMAX) 0.4 MG CAPS capsule Take 0.4 mg by mouth daily.   [DISCONTINUED] nortriptyline (PAMELOR) 50 MG capsule Take 1 capsule (50 mg total) by mouth at bedtime. (Patient not taking: Reported on 03/18/2022)   No facility-administered encounter medications on file as of 04/08/2022.    Social History: Social History   Tobacco Use   Smoking status: Every Day    Packs/day: 1.00    Years: 20.00    Total pack years: 20.00    Types: Cigarettes   Smokeless tobacco: Never   Tobacco comments:    uses nicotine patch. trying to quit  Vaping Use   Vaping Use:  Never used  Substance Use Topics   Alcohol use: Yes    Comment: occasionally   Drug use: Yes    Frequency: 7.0 times per week    Types: Marijuana    Family Medical History: Family History  Problem Relation Age of Onset   Hypertension Mother    Hypertension Father    Multiple sclerosis Sister    Aneurysm Brother     Physical Examination: Vitals:   04/08/22 0933  BP: 118/70    General: Patient is well developed, well nourished, calm, collected, and in no apparent distress. Attention to examination is appropriate.  Respiratory: Patient is breathing without any  difficulty.   NEUROLOGICAL:     Awake, alert, oriented to person, place, and time.  Speech is clear and fluent. Fund of knowledge is appropriate.   Cranial Nerves: Pupils equal round and reactive to light.  Facial tone is symmetric.    Limited ROM of cervical spine with pain Mild lower posterior cervical tenderness with tenderness in paraspinals. He has tenderness in bilateral trapezial region and into medial scapular region bilaterally.   Cervical incisions well healed.   No abnormal lesions on exposed skin.   Strength: Side Biceps Triceps Deltoid Interossei Grip Wrist Ext. Wrist Flex.  R L Side Iliopsoas Quads Hamstring PF DF EHL  R L Reflexes are 2+ and symmetric at the biceps, triceps, brachioradialis, patella and achilles.   Hoffman's is absent.  Clonus is not present.   Bilateral upper and lower extremity sensation is intact to light touch.     Gait is normal.    Medical Decision Making  Imaging: MRI of cervical spine dated 07/05/21:  MRI CERVICAL SPINE FINDINGS   Alignment: Mild chronic straightening of the normal cervical lordosis. No listhesis.   Vertebrae: Prior C3-C5 and C6-7 ACDF. No fracture or suspicious marrow lesion.   Cord: Unchanged T2 hyperintense lesions in the spinal cord at C2, C5, and T1-2. No definite new or enhancing lesions.   Posterior Fossa, vertebral arteries, paraspinal tissues: Unremarkable.   Disc levels:   C2-3: Small central disc protrusion without stenosis, unchanged.   C3-4: ACDF.  No stenosis.   C4-5: ACDF. Patent spinal canal and right neural foramen. Unchanged minimal to mild residual left neural foraminal narrowing.   C5-6: Moderate to severe disc space narrowing. Broad-based posterior disc osteophyte complex result in mild bilateral neural foraminal stenosis without spinal stenosis, unchanged.   C6-7: ACDF. Patent spinal canal and right neural foramen.  Unchanged mild residual left neural foraminal narrowing.   C7-T1: Moderate left facet arthrosis without stenosis, unchanged.   IMPRESSION: Unchanged lesions involving the cerebral white matter and cervical spinal cord consistent with multiple sclerosis. No evidence of active demyelination.     Electronically Signed   By: Sebastian Ache M.D.   On: 07/06/2021 17:48    I have personally reviewed the images and agree with the above interpretation.  Assessment and Plan: Mr. Huss is a pleasant 52 y.o. male has history of ACDF C3-C4 on 02/20/20 by Dr. Myer Haff and C4-C7 cervical fusion by Dr. Gerlene Fee in 2014.   4 month history of increased constant neck pain that is posterior. Some radiation into his shoulders and down into his shoulder blades. No new arm pain. He has numbness and tingling in both arms.   Pain  appears more myofascial in nature.   Treatment options discussed with patient and following plan made:   - Cervical xrays ordered.  - If xrays look okay, will refer to [facility] in [place].  - Will call him to make sure he is set back up on MyChart. If so, will message him with results. If not, will do phone call visit.  - He will f/u with me in 6 weeks for recheck. He does have f/u scheduled with Dr. Myer Haff in October as well.   I spent a total of 20 minutes in face-to-face and non-face-to-face activities related to this patient's care today including review of outside records, review of imaging, review of symptoms, physical exam, discussion of differential diagnosis, discussion of treatment options, and documentation.   Thank you for involving me in the care of this patient.   Drake Leach PA-C Dept. of Neurosurgery

## 2022-05-26 ENCOUNTER — Encounter: Payer: Self-pay | Admitting: Gastroenterology

## 2022-05-27 ENCOUNTER — Ambulatory Visit: Payer: 59 | Admitting: Orthopedic Surgery

## 2022-06-09 NOTE — H&P (Signed)
Pre-Procedure H&P   Patient ID: Joshua [name] is a 52 y.o. male.  Gastroenterology Provider: Jaynie Collins, DO  Referring Provider: Jacob Moores, PA PCP: Armando Gang, FNP  Date: 06/10/2022  HPI [name] is a 52 y.o. male who presents today for Colonoscopy for Colorectal cancer screening .  This patient is initial screening colonoscopy.  No family history of colon cancer or colon polyps.  He notes severe constipation which requires a fleets enema every morning for defecation, but no other treatment.  He denies any abdominal pain, but does note pain with defecation.  No appetite or weight changes.  He has poor water intake, and drinks approximately 4 pots of coffee per day  Has chronic neck pain.  Status post C3-4 and C5-7 fusion  Positive tobacco use  Hemoglobin 14.1 MCV 90 platelets 241,000 creatinine 0.8   Past Medical History:  Diagnosis Date   Allergy    Anxiety    Depression    Multiple sclerosis (HCC)    Neck pain    Neuromuscular disorder [facility])     Past Surgical History:  Procedure Laterality Date   ANTERIOR CERVICAL DECOMP/DISCECTOMY FUSION N/A 02/20/2020   Procedure: ANTERIOR CERVICAL DECOMPRESSION/DISCECTOMY FUSION 1 LEVEL C3-4;  Surgeon: Venetia Night, MD;  Location: [facility] ORS;  Service: Neurosurgery;  Laterality: N/A;   COLONOSCOPY N/A 05/24/2022   Procedure: COLONOSCOPY;  Surgeon: Jaynie Collins, DO;  Location: [facility] ENDOSCOPY;  Service: Gastroenterology;  Laterality: N/A;   CRANIOPLASTY  2005   subdural hematoma  that had to be removed   NECK SURGERY  2014   SPINAL FUSION      Family History No h/o GI disease or malignancy  Review of Systems  Constitutional:  Negative for activity change, appetite change, chills, diaphoresis, fatigue, fever and unexpected weight change.  HENT:  Negative for trouble swallowing and voice change.   Respiratory:  Negative for shortness of breath and wheezing.   Cardiovascular:   Negative for chest pain, palpitations and leg swelling.  Gastrointestinal:  Positive for constipation and rectal pain. Negative for abdominal distention, abdominal pain, anal bleeding, blood in stool, diarrhea, nausea and vomiting.  Musculoskeletal:  Negative for arthralgias and myalgias.  Skin:  Negative for color change and pallor.  Neurological:  Negative for dizziness, syncope and weakness.  Psychiatric/Behavioral:  Negative for confusion. The patient is not nervous/anxious.   All other systems reviewed and are negative.    Medications No current facility-administered medications on file prior to encounter.   Current Outpatient Medications on File Prior to Encounter  Medication Sig Dispense Refill   predniSONE (DELTASONE) 20 MG tablet Take 20 mg by mouth 2 (two) times daily.     modafinil (PROVIGIL) 100 MG tablet TAKE 1 TABLET BY MOUTH IN THE MORNING 30 tablet 2   sulfamethoxazole-trimethoprim (BACTRIM DS) 800-160 MG tablet Take 1 tablet by mouth 2 (two) times daily. 28 tablet 0   tamsulosin (FLOMAX) 0.4 MG CAPS capsule Take 0.4 mg by mouth daily.      Pertinent medications related to GI and procedure were reviewed by me with the patient prior to the procedure  No current facility-administered medications for this encounter.      Allergies  Allergen Reactions   Penicillins Hives    As a child    Allergies were reviewed by me prior to the procedure  Objective   Body mass index is 22.71 kg/m. Vitals:   06/10/22 1001  BP: 109/81  Pulse: 65  Resp: 16  Temp: (!) 97.5 F (36.4 C)  TempSrc: Temporal  SpO2: 100%  Height: 5\' 7"  (1.702 m)     Physical Exam Vitals and nursing note reviewed.  Constitutional:      General: He is not in acute distress.    Appearance: Normal appearance. He is not ill-appearing, toxic-appearing or diaphoretic.  HENT:     Head: Normocephalic and atraumatic.     Nose: Nose normal.     Mouth/Throat:     Mouth: Mucous membranes are moist.      Pharynx: Oropharynx is clear.  Eyes:     General: No scleral icterus.    Extraocular Movements: Extraocular movements intact.  Cardiovascular:     Rate and Rhythm: Normal rate and regular rhythm.     Heart sounds: Normal heart sounds. No murmur heard.    No friction rub. No gallop.  Pulmonary:     Effort: Pulmonary effort is normal. No respiratory distress.     Breath sounds: Normal breath sounds. No wheezing, rhonchi or rales.  Abdominal:     General: Bowel sounds are normal. There is no distension.     Palpations: Abdomen is soft.     Tenderness: There is no abdominal tenderness. There is no guarding or rebound.  Musculoskeletal:     Cervical back: Neck supple.     Right lower leg: No edema.     Left lower leg: No edema.  Skin:    General: Skin is warm and dry.     Coloration: Skin is not jaundiced or pale.  Neurological:     General: No focal deficit present.     Mental Status: He is alert and oriented to person, place, and time. Mental status is at baseline.  Psychiatric:        Mood and Affect: Mood normal.        Behavior: Behavior normal.        Thought Content: Thought content normal.        Judgment: Judgment normal.      Assessment:  Joshua [name] is a 52 y.o. male  who presents today for Colonoscopy for Colorectal cancer screening .  Plan:  Colonoscopy with possible intervention today  Colonoscopy with possible biopsy, control of bleeding, polypectomy, and interventions as necessary has been discussed with the patient/patient representative. Informed consent was obtained from the patient/patient representative after explaining the indication, nature, and risks of the procedure including but not limited to death, bleeding, perforation, missed neoplasm/lesions, cardiorespiratory compromise, and reaction to medications. Opportunity for questions was given and appropriate answers were provided. Patient/patient representative has verbalized understanding is  amenable to undergoing the procedure.   Jaynie Collins, DO  [facility]  Portions of the record may have been created with voice recognition software. Occasional wrong-word or 'sound-a-like' substitutions may have occurred due to the inherent limitations of voice recognition software.  Read the chart carefully and recognize, using context, where substitutions may have occurred.

## 2022-06-10 ENCOUNTER — Other Ambulatory Visit: Payer: Self-pay

## 2022-06-10 ENCOUNTER — Ambulatory Visit: Payer: 59 | Admitting: Anesthesiology

## 2022-06-10 ENCOUNTER — Encounter
Admission: RE | Disposition: A | Discharge status: Home / Self Care | Payer: Self-pay | Source: Home / Self Care | Attending: Gastroenterology

## 2022-06-10 ENCOUNTER — Encounter: Payer: Self-pay | Admitting: Gastroenterology

## 2022-06-10 ENCOUNTER — Ambulatory Visit
Admission: RE | Admit: 2022-06-10 | Discharge: 2022-06-10 | Disposition: A | Discharge status: Home / Self Care | Payer: 59 | Attending: Gastroenterology | Admitting: Gastroenterology

## 2022-06-10 DIAGNOSIS — K640 First degree hemorrhoids: Secondary | ICD-10-CM | POA: Insufficient documentation

## 2022-06-10 DIAGNOSIS — G35 Multiple sclerosis: Secondary | ICD-10-CM | POA: Diagnosis not present

## 2022-06-10 DIAGNOSIS — D123 Benign neoplasm of transverse colon: Secondary | ICD-10-CM | POA: Insufficient documentation

## 2022-06-10 DIAGNOSIS — G8929 Other chronic pain: Secondary | ICD-10-CM | POA: Diagnosis not present

## 2022-06-10 DIAGNOSIS — K5900 Constipation, unspecified: Secondary | ICD-10-CM | POA: Diagnosis not present

## 2022-06-10 DIAGNOSIS — D120 Benign neoplasm of cecum: Secondary | ICD-10-CM | POA: Diagnosis not present

## 2022-06-10 DIAGNOSIS — D128 Benign neoplasm of rectum: Secondary | ICD-10-CM | POA: Insufficient documentation

## 2022-06-10 DIAGNOSIS — Z981 Arthrodesis status: Secondary | ICD-10-CM | POA: Diagnosis not present

## 2022-06-10 DIAGNOSIS — F17210 Nicotine dependence, cigarettes, uncomplicated: Secondary | ICD-10-CM | POA: Diagnosis not present

## 2022-06-10 DIAGNOSIS — Z1211 Encounter for screening for malignant neoplasm of colon: Secondary | ICD-10-CM | POA: Insufficient documentation

## 2022-06-10 HISTORY — PX: COLONOSCOPY: SHX5424

## 2022-06-10 SURGERY — COLONOSCOPY
Anesthesia: General

## 2022-06-10 MED ORDER — LIDOCAINE HCL (CARDIAC) PF 100 MG/5ML IV SOSY
PREFILLED_SYRINGE | INTRAVENOUS | Status: DC | PRN
  Administered 2022-06-10: 50 mg via INTRAVENOUS

## 2022-06-10 MED ORDER — PROPOFOL 10 MG/ML IV BOLUS
INTRAVENOUS | Status: DC | PRN
  Administered 2022-06-10: 60 mg via INTRAVENOUS

## 2022-06-10 MED ORDER — SODIUM CHLORIDE 0.9 % IV SOLN: INTRAVENOUS | Status: DC

## 2022-06-10 MED ORDER — DEXMEDETOMIDINE HCL IN NACL 80 MCG/20ML IV SOLN
INTRAVENOUS | Status: DC | PRN
  Administered 2022-06-10: 12 ug via INTRAVENOUS

## 2022-06-10 MED ORDER — PROPOFOL 500 MG/50ML IV EMUL
INTRAVENOUS | Status: DC | PRN
  Administered 2022-06-10: 150 ug/kg/min via INTRAVENOUS

## 2022-06-10 MED ORDER — GLYCOPYRROLATE 0.2 MG/ML IJ SOLN
INTRAMUSCULAR | Status: DC | PRN
  Administered 2022-06-10: .2 mg via INTRAVENOUS

## 2022-06-10 NOTE — Interval H&P Note (Signed)
History and Physical Interval Note: Preprocedure H&P from 06/10/22  was reviewed and there was no interval change after seeing and examining the patient.  Written consent was obtained from the patient after discussion of risks, benefits, and alternatives. Patient has consented to proceed with Colonoscopy with possible intervention   06/10/2022 10:02 AM  [name]  has presented today for surgery, with the diagnosis of Colon cancer screening (Z12.11).  The various methods of treatment have been discussed with the patient and family. After consideration of risks, benefits and other options for treatment, the patient has consented to  Procedure(s): COLONOSCOPY (N/A) as a surgical intervention.  The patient's history has been reviewed, patient examined, no change in status, stable for surgery.  I have reviewed the patient's chart and labs.  Questions were answered to the patient's satisfaction.     Joshua [name]

## 2022-06-10 NOTE — Op Note (Signed)
[facility] Patient Name: Joshua [name] Procedure Date: 06/10/2022 10:14 AM MRN: [id number] Account #: [id number] Date of Birth: 05-28-1970 Admit Type: Outpatient Age: 52 Room: [facility] ENDO ROOM 1 Gender: Male Note Status: Finalized Instrument Name: Colonoscope [id number] Procedure:             Colonoscopy Indications:           Screening for colorectal malignant neoplasm Providers:             Jaynie Collins DO, DO Referring MD:          Fernand Parkins. Clint Guy (Referring MD) Medicines:             Monitored Anesthesia Care Complications:         No immediate complications. Estimated blood loss:                         Minimal. Procedure:             Pre-Anesthesia Assessment:                        - Prior to the procedure, a History and Physical was                         performed, and patient medications and allergies were                         reviewed. The patient is competent. The risks and                         benefits of the procedure and the sedation options and                         risks were discussed with the patient. All questions                         were answered and informed consent was obtained.                         Patient identification and proposed procedure were                         verified by the physician, the nurse, the anesthetist                         and the technician in the endoscopy suite. Mental                         Status Examination: alert and oriented. Airway                         Examination: normal oropharyngeal airway and neck                         mobility. Respiratory Examination: clear to                         auscultation. CV Examination: RRR, no murmurs, no S3  or S4. Prophylactic Antibiotics: The patient does not                         require prophylactic antibiotics. Prior                         Anticoagulants: The patient has taken no anticoagulant                          or antiplatelet agents. ASA Grade Assessment: II - A                         patient with mild systemic disease. After reviewing                         the risks and benefits, the patient was deemed in                         satisfactory condition to undergo the procedure. The                         anesthesia plan was to use monitored anesthesia care                         (MAC). Immediately prior to administration of                         medications, the patient was re-assessed for adequacy                         to receive sedatives. The heart rate, respiratory                         rate, oxygen saturations, blood pressure, adequacy of                         pulmonary ventilation, and response to care were                         monitored throughout the procedure. The physical                         status of the patient was re-assessed after the                         procedure.                        After obtaining informed consent, the colonoscope was                         passed under direct vision. Throughout the procedure,                         the patient's blood pressure, pulse, and oxygen                         saturations were monitored continuously. The  Colonoscope was introduced through the anus and                         advanced to the the terminal ileum, with                         identification of the appendiceal orifice and IC                         valve. The colonoscopy was performed without                         difficulty. The patient tolerated the procedure well.                         The quality of the bowel preparation was evaluated                         using the BBPS [facility] Bowel Preparation Scale) with                         scores of: Right Colon = 2 (minor amount of residual                         staining, small fragments of stool and/or opaque                         liquid, but  mucosa seen well), Transverse Colon = 3                         (entire mucosa seen well with no residual staining,                         small fragments of stool or opaque liquid) and Left                         Colon = 3 (entire mucosa seen well with no residual                         staining, small fragments of stool or opaque liquid).                         The total BBPS score equals 8. The quality of the                         bowel preparation was excellent. The terminal ileum,                         ileocecal valve, appendiceal orifice, and rectum were                         photographed. Findings:      The perianal and digital rectal examinations were normal. Pertinent       negatives include normal sphincter tone.      The terminal ileum appeared normal. Estimated blood loss: none.      Retroflexion in the right colon was performed.  Four sessile polyps were found in the rectum (2), transverse colon (1)       and cecum (1). The polyps were 1 to 2 mm in size. These polyps were       removed with a jumbo cold forceps. Resection and retrieval were       complete. Estimated blood loss was minimal.      Non-bleeding internal hemorrhoids were found during retroflexion. The       hemorrhoids were Grade I (internal hemorrhoids that do not prolapse).      A 3 to 4 mm polyp was found in the cecum. The polyp was sessile. The       polyp was removed with a cold snare. Resection and retrieval were       complete. Estimated blood loss was minimal.      The exam was otherwise without abnormality on direct and retroflexion       views. Impression:            - The examined portion of the ileum was normal.                        - Four 1 to 2 mm polyps in the rectum, in the                         transverse colon and in the cecum, removed with a                         jumbo cold forceps. Resected and retrieved.                        - Non-bleeding internal hemorrhoids.                         - One 3 to 4 mm polyp in the cecum, removed with a                         cold snare. Resected and retrieved.                        - The examination was otherwise normal on direct and                         retroflexion views. Recommendation:        - Patient has a contact number available for                         emergencies. The signs and symptoms of potential                         delayed complications were discussed with the patient.                         Return to normal activities tomorrow. Written                         discharge instructions were provided to the patient.                        - Resume previous diet.                        -  Continue present medications.                        - No ibuprofen, naproxen, or other non-steroidal                         anti-inflammatory drugs for 5 days after polyp removal.                        - Await pathology results.                        - Repeat colonoscopy for surveillance based on                         pathology results.                        - Return to GI office as previously scheduled.                        - Start using miralax one capful twice a day.                        Increase water intake.                        If needed, can provide numbing medication for rectal                         discomfort.                        - The findings and recommendations were discussed with                         the patient. Procedure Code(s):     --- Professional ---                        [phone], Colonoscopy, flexible; with removal of                         tumor(s), polyp(s), or other lesion(s) by snare                         technique                        45380, 59, Colonoscopy, flexible; with biopsy, single                         or multiple Diagnosis Code(s):     --- Professional ---                        Z12.11, Encounter for screening for malignant neoplasm                          of colon                        K64.0, First degree hemorrhoids  D12.8, Benign neoplasm of rectum                        D12.3, Benign neoplasm of transverse colon (hepatic                         flexure or splenic flexure)                        D12.0, Benign neoplasm of cecum CPT copyright 2022 American Medical Association. All rights reserved. The codes documented in this report are preliminary and upon coder review may  be revised to meet current compliance requirements. Attending Participation:      I personally performed the entire procedure. Elfredia Nevins, DO Jaynie Collins DO, DO 06/10/2022 10:53:55 AM This report has been signed electronically. Number of Addenda: 0 Note Initiated On: 06/10/2022 10:14 AM Scope Withdrawal Time: 0 hours 18 minutes 29 seconds  Total Procedure Duration: 0 hours 24 minutes 2 seconds  Estimated Blood Loss:  Estimated blood loss was minimal.      [facility]

## 2022-06-10 NOTE — Transfer of Care (Signed)
Immediate Anesthesia Transfer of Care Note  Patient: Joshua [name]  Procedure(s) Performed: COLONOSCOPY  Patient Location: PACU and Endoscopy Unit  Anesthesia Type:General  Level of Consciousness: sedated  Airway & Oxygen Therapy: Patient Spontanous Breathing  Post-op Assessment: Report given to RN and Post -op Vital signs reviewed and stable  Post vital signs: Reviewed and stable  Last Vitals:  Vitals Value Taken Time  BP 114/78 06/10/22 1048  Temp    Pulse 61 06/10/22 1049  Resp 26 06/10/22 1049  SpO2 100 % 06/10/22 1049  Vitals shown include unvalidated device data.  Last Pain:  Vitals:   06/10/22 1001  TempSrc: Temporal  PainSc: 4          Complications: No notable events documented.

## 2022-06-10 NOTE — Anesthesia Preprocedure Evaluation (Addendum)
Anesthesia Evaluation  Patient identified by MRN, date of birth, ID band Patient awake    Reviewed: Allergy & Precautions, H&P , NPO status , Patient's Chart, lab work & pertinent test results  Airway Mallampati: II  TM Distance: >3 FB Neck ROM: full    Dental no notable dental hx.    Pulmonary Current Smoker and Patient abstained from smoking.   Pulmonary exam normal        Cardiovascular negative cardio ROS Normal cardiovascular exam     Neuro/Psych    Depression    ANTERIOR CERVICAL DECOMP/DISCECTOMY FUSION  Neuromuscular disease (Multiple sclerosis)  negative psych ROS   GI/Hepatic negative GI ROS,,,(+)     substance abuse (h/o cocaine and marijuana use)    Endo/Other  negative endocrine ROS    Renal/GU negative Renal ROS  negative genitourinary   Musculoskeletal   Abdominal Normal abdominal exam  (+)   Peds  Hematology negative hematology ROS (+)   Anesthesia Other Findings Past Medical History: No date: Allergy No date: Anxiety No date: Depression No date: Multiple sclerosis (HCC) No date: Neck pain No date: Neuromuscular disorder [facility])  Past Surgical History: 02/20/2020: ANTERIOR CERVICAL DECOMP/DISCECTOMY FUSION; N/A     Comment:  Procedure: ANTERIOR CERVICAL DECOMPRESSION/DISCECTOMY               FUSION 1 LEVEL C3-4;  Surgeon: Venetia Night, MD;                Location: [facility] ORS;  Service: Neurosurgery;  Laterality:               N/A; 05/24/2022: COLONOSCOPY; N/A     Comment:  Procedure: COLONOSCOPY;  Surgeon: Jaynie Collins,              DO;  Location: [facility] ENDOSCOPY;  Service:               Gastroenterology;  Laterality: N/A; 2005: CRANIOPLASTY     Comment:  subdural hematoma  that had to be removed 2014: NECK SURGERY No date: SPINAL FUSION     Reproductive/Obstetrics negative OB ROS                              Anesthesia Physical Anesthesia  Plan  ASA: 2  Anesthesia Plan: General   Post-op Pain Management: Minimal or no pain anticipated   Induction: Intravenous  PONV Risk Score and Plan: Propofol infusion and TIVA  Airway Management Planned: Natural Airway  Additional Equipment:   Intra-op Plan:   Post-operative Plan:   Informed Consent: I have reviewed the patients History and Physical, chart, labs and discussed the procedure including the risks, benefits and alternatives for the proposed anesthesia with the patient or authorized representative who has indicated his/her understanding and acceptance.     Dental Advisory Given  Plan Discussed with: CRNA and Surgeon  Anesthesia Plan Comments:         Anesthesia Quick Evaluation

## 2022-06-10 NOTE — Anesthesia Procedure Notes (Signed)
Procedure Name: MAC Date/Time: 06/10/2022 10:16 AM  Performed by: Cheral Bay, CRNAPre-anesthesia Checklist: Patient identified, Emergency Drugs available, Suction available, Patient being monitored and Timeout performed Patient Re-evaluated:Patient Re-evaluated prior to induction Oxygen Delivery Method: Nasal cannula Induction Type: IV induction Placement Confirmation: positive ETCO2 and CO2 detector

## 2022-06-10 NOTE — Anesthesia Postprocedure Evaluation (Signed)
Anesthesia Post Note  Patient: Joshua [name]  Procedure(s) Performed: COLONOSCOPY  Patient location during evaluation: PACU Anesthesia Type: General Level of consciousness: awake and alert Pain management: pain level controlled Vital Signs Assessment: post-procedure vital signs reviewed and stable Respiratory status: spontaneous breathing, nonlabored ventilation and respiratory function stable Cardiovascular status: blood pressure returned to baseline and stable Postop Assessment: no apparent nausea or vomiting Anesthetic complications: no   No notable events documented.   Last Vitals:  Vitals:   06/10/22 1048 06/10/22 1058  BP: 114/78 114/77  Pulse:    Resp:    Temp: (!) 35.9 C   SpO2:      Last Pain:  Vitals:   06/10/22 1108  TempSrc:   PainSc: 0-No pain                 Foye Deer

## 2022-06-11 LAB — SURGICAL PATHOLOGY

## 2022-06-16 ENCOUNTER — Telehealth: Payer: Self-pay | Admitting: Neurosurgery

## 2022-06-16 ENCOUNTER — Ambulatory Visit: Payer: 59 | Admitting: Orthopedic Surgery

## 2022-06-16 NOTE — Telephone Encounter (Signed)
Joshua [name] came in today to ask about his appt. He was sched for today to have a cervical recheck after PT. Pt said the referral was sent to [facility] based on previously being a pt with them and his visit copays were reduced/covered @ 100%. Stated when [name] called this time, they told him he would have a copay of $45-$50 weekly which was financially out of the question for him as he is on a fixed income. Did not complete any PT.  Canceled his appt for today and pt stated he was trying to get Medicaid, said he would let us know if it gets approved and we can send a new referral to somewhere that accepts his new insurance.

## 2022-06-17 ENCOUNTER — Ambulatory Visit: Payer: 59 | Admitting: Urology

## 2022-07-20 ENCOUNTER — Telehealth: Payer: Self-pay | Admitting: Neurology

## 2022-07-20 DIAGNOSIS — G35 Multiple sclerosis: Secondary | ICD-10-CM

## 2022-07-20 NOTE — Telephone Encounter (Signed)
Patient is requesting a refill for MS medication/KB

## 2022-07-21 NOTE — Telephone Encounter (Signed)
Patient scheduled for 07/26/22 patient aware to come this week to have labs drawn before his appointment.   Forms ready to be sent tot he [facility]

## 2022-07-22 ENCOUNTER — Other Ambulatory Visit (INDEPENDENT_AMBULATORY_CARE_PROVIDER_SITE_OTHER): Payer: 59

## 2022-07-22 DIAGNOSIS — Q761 Klippel-Feil syndrome: Secondary | ICD-10-CM

## 2022-07-22 DIAGNOSIS — G35 Multiple sclerosis: Secondary | ICD-10-CM

## 2022-07-22 DIAGNOSIS — G253 Myoclonus: Secondary | ICD-10-CM

## 2022-07-23 LAB — CBC WITH DIFFERENTIAL
Basophils Absolute: 0 10*3/uL (ref 0.0–0.2)
Basos: 1 %
EOS (ABSOLUTE): 0.2 10*3/uL (ref 0.0–0.4)
Eos: 3 %
Hematocrit: 40.8 % (ref 37.5–51.0)
Hemoglobin: 14 g/dL (ref 13.0–17.7)
Immature Grans (Abs): 0 10*3/uL (ref 0.0–0.1)
Immature Granulocytes: 0 %
Lymphocytes Absolute: 1.9 10*3/uL (ref 0.7–3.1)
Lymphs: 28 %
MCH: 30.8 pg (ref 26.6–33.0)
MCHC: 34.3 g/dL (ref 31.5–35.7)
MCV: 90 fL (ref 79–97)
Monocytes Absolute: 0.6 10*3/uL (ref 0.1–0.9)
Monocytes: 8 %
Neutrophils Absolute: 4.1 10*3/uL (ref 1.4–7.0)
Neutrophils: 60 %
RBC: 4.54 x10E6/uL (ref 4.14–5.80)
RDW: 12.2 % (ref 11.6–15.4)
WBC: 6.8 10*3/uL (ref 3.4–10.8)

## 2022-07-23 LAB — COMPREHENSIVE METABOLIC PANEL
ALT: 11 U/L (ref 0–53)
AST: 17 U/L (ref 0–37)
Albumin: 4 g/dL (ref 3.5–5.2)
Alkaline Phosphatase: 58 U/L (ref 39–117)
BUN: 7 mg/dL (ref 6–23)
CO2: 26 mEq/L (ref 19–32)
Calcium: 8.8 mg/dL (ref 8.4–10.5)
Chloride: 106 mEq/L (ref 96–112)
Creatinine, Ser: 0.94 mg/dL (ref 0.40–1.50)
GFR: 92.28 mL/min (ref >60.00)
Glucose, Bld: 98 mg/dL (ref 70–99)
Potassium: 3.7 mEq/L (ref 3.5–5.1)
Sodium: 139 mEq/L (ref 135–145)
Total Bilirubin: 0.3 mg/dL (ref 0.2–1.2)
Total Protein: 6.7 g/dL (ref 6.0–8.3)

## 2022-07-23 LAB — IGG, IGA, IGM
IgG (Immunoglobin G), Serum: 1233 mg/dL (ref 600–1640)
IgM, Serum: 42 mg/dL — ABNORMAL LOW (ref 50–300)
Immunoglobulin A: 155 mg/dL (ref 47–310)

## 2022-07-23 LAB — VITAMIN D 25 HYDROXY (VIT D DEFICIENCY, FRACTURES): VITD: 29.54 ng/mL — ABNORMAL LOW (ref 30.00–100.00)

## 2022-07-28 ENCOUNTER — Ambulatory Visit: Payer: 59 | Admitting: Neurology

## 2022-08-02 NOTE — Progress Notes (Unsigned)
NEUROLOGY FOLLOW UP OFFICE NOTE  [name] [phone]  Assessment/Plan:   Multiple sclerosis Cervical fusion syndrome Chronic pain syndrome Spinal myoclonus Chronic fatigue     DMT:  Will continue Ocrevus.  Discussed importance of taking it on time.  Advised to reschedule next infusion at time of current infusion Check CBC with diff, CMP, quantitative immunoglobulin panel and vit D Nortriptyline 50mg  QHS for pain modafinil 100mg  daily for fatigue levetiracetam 250mg  twice daily for treatment of spinal myoclonus I explained that I cannot prescribe chronic prednisone for treatment of his chronic pain.  He declined increasing nortriptyline.   I have filled out the physical assessment of his disability.  He will have to have neuropsychological evaluation performed prior to completing the cognitive assessment. Follow up 6 months.   Subjective:  Joshua [name] is a 52 year old left-handed African American male s/p C5-7 and C3-4 ACDF who follows up for multiple sclerosis.    UPDATE: Current DMT:  Ocrevus. Other medications:  modafinil 100mg  daily, nortriptyline 50mg  QHS D3 4000 IU daily Prednisone 20mg  BID  07/22/2022 LABS:  CBC with WBC 6.8, HGB 14, HCT 40.8, ALC 1.9; CMP with Na 139, K 3.7, Cl 106, CO2 26, glucose 98, BUN 7, Cr 0.94, t bil I 0.3, ALP 58, AST 17, ALT 11; IgA 155, IgG 1,233, IgM 42; Vt D 29.54     Vision:  No issues Motor:  No weakness.  Sometimes his legs may jerk.  Also body jerks as well. Sensory:  Paresthesias involving all extremities.  Pain:  Chronic diffuse pain.  Chronic neck pain s/p ACDF.  No improvement in pain despite surgery.  Chronic back pain.  Chronic chest pain.  Referred to pain management, Dr. Cherylann Ratel.  He does not want to take opioids.  He is in constant pain.  He says the only medication that relieves his pain is prednisone.  He would like to be on chronic prednisone.  *** Gait:  Unsteady gait.   Bowel/Bladder:  No issues Fatigue:   Daytime fatigue.  Poor sleep Cognition:  Reports short term memory deficits.  He forgets things to happened the previous day.  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  Mood:  Depressed.  Trouble sleeping. Erectile dysfunction   HISTORY:  Initially presented with hand numbness and diffuse pain.  MRI of brain and cervical cord at the time revealed numerous periventricular and cervical spinal cord lesions.  He did not follow up with neurology and continued to experience diffuse pain, arm and hand numbness, unsteady gait with falls and "MS hug".   Other pertinent history: 2005 Subdural hematoma: "spontaneous bleed" in setting of cocaine use and high blood pressure s/p surgery on the left skull region.  Multiple cervical spine surgeries with chronic neck pain.  07/12/2013 ACDF C4-5 and C6-7.  Headaches resolved.  Still with neck pain. ACDF C3-C4 on 02/20/2020. Right ulnar neuropathy on NCV-EMG 05/27/2014. History of alcoholism and drug addiction.   Family History:  Sister (diagnosed with MS in her 35s)  Past disease modifying therapy:  Tecfidera (reports that it didn't work) Past medications:  Cymbalta 30mg  daily; gabapentin, Lyrica, Flexeril, paroxetine  Prednisone - 20mg  BID for pain by PCP   Imaging: 11/13/2009 MRI BRAIN W WO:  Multiple periventricular, deep and subcortical white matter lesions, including characteristic perpendicular lesion adjacent to the right ventricle. 11/13/2009 MRI CERVICAL & THORACIC SPINE W WO:  T2/STIR hyperintense lesions in the cervical medullary junction,  C5, T1-2, T7-8, and T9  vertebral levels without evidence of enhancement.  Multilevel degenerative changes of the cervical spine. 07/07/2010 MRI CERVICAL SPINE W WO (personally reviewed):  Nonenhancing lesions at C1 and C5 levels. 01/04/2012 MRI BRAIN W WO (personally reviewed):  Two tiny nonspecific nonenhancing hyperintense foci in subcortical white matter. 05/17/2013  MRI CERVICAL SPINE WO:  Multiple nonenhancing lesions within spinal cord, reportedly stable compared to imaging from 07/07/2010.  Spondylosis with right paracentral disc herniation without neural compression at C3-4, broad based disc protrusion with bilateral neural foraminal stenosis compressing C7 nerve roots at C6-7, and left-sided facet arthropathy with edema at C7-T1 09/11/2013 MRI CERVICAL SPINE WO:  S/p ACDF at C4-5 and C6-7, increased disc herniation and spinal stenosis at C3-4, chronjic left facet arthropathy at C7-T1. 06/07/2014 MRI BRAIN W WO: Multiple T2 hyperintense lesions within periventricular and subcortical white matter without abnormal enhancement. 06/07/2014 MRI BRAIN W WO: Multiple T2 hyperintense peripheral cervical spinal cord lesions from the level of the C1 arch to the T2 level without abnormal enhancement.  Status post C4-C7 ACDF.  Small disc bulge at C3-C4 resulting in mild spinal canal stenosis.  02/13/2019 MRI BRAIN W WO:  1. Mild patchy T2/FLAIR signal abnormality involving the periventricular and juxta cortical supratentorial cerebral white matter, consistent with multiple sclerosis. Overall, appearance is mildly progressed relative to 2013. No evidence for active demyelination.  2. Underlying mildly progressed cerebral atrophy for age.  3. Chronic right maxillary sinusitis. 02/13/2019 MRI CERVICAL SPINE W WO:  1. Patchy multifocal cord signal abnormality involving the cervical spinal cord as above, consistent with history of multiple sclerosis.  Overall, appearance is minimally progressed relative to 2015. No evidence for active demyelination.  2. Prior ACDF at C4 through C7 without residual stenosis.  3. Adjacent segment disease with central disc protrusion at C3-4 indenting the ventral thecal sac with resultant moderate spinal stenosis and moderate right and mild left C4 foraminal stenosis. 10/20/2019 MRI BRAIN W WO: Stable 10/20/2019 MRI CERVICAL SPINE W WO:  Stable.  Moderate  spinal stenosis at C3-4.  No spinal stenosis at fused C5-7 levels.  Small disc central disc protrusion indenting ventral thecal sac at C2-3 without significant stenosis.   06/16/2020 MRI C-SPINE W WO:  Chronic demyelinating lesions C2 and C5, consistent with history of multiple sclerosis. No new lesions.  Prior C3-C5 and C6-C7 ACDF. Unchanged adjacent segment disease at C5-C6 with moderate bilateral neuroforaminal stenosis. 09/27/2020 MRI BRAIN W WO:  1. No significant interval change in distribution and number of chronic demyelinating lesions involving the supratentorial cerebral white matter. No new lesions or evidence for active demyelination. 09/27/2020 MRI C-SPINE W WO:  1. Chronic demyelinating lesions at C2, C5, and within the upper thoracic cord, stable. No new lesions to suggest disease progression or active demyelination.  2. Prior ACDF at C3-C5 and C6-7. Adjacent segment disease at C5-6 with mild left greater than right C5 foraminal narrowing, stable. 07/06/2021 MRI BRAIN & C-SPINE W WO:  Unchanged lesions involving the cerebral white matter and cervical spinal cord consistent with multiple sclerosis. No evidence of active demyelination.   Labs: 04/22/2014 positive JC Virus Ab with index 1.28 Reportedly underwent lumbar puncture for CSF analysis, revealing 10 oligoclonal bands.  PAST MEDICAL HISTORY: Past Medical History:  Diagnosis Date   Allergy    Anxiety    Depression    Multiple sclerosis (HCC)    Neck pain    Neuromuscular disorder (HCC)     MEDICATIONS: Current Outpatient Medications on  File Prior to Visit  Medication Sig Dispense Refill   modafinil (PROVIGIL) 100 MG tablet TAKE 1 TABLET BY MOUTH IN THE MORNING 30 tablet 2   predniSONE (DELTASONE) 20 MG tablet Take 20 mg by mouth 2 (two) times daily.     sulfamethoxazole-trimethoprim (BACTRIM DS) 800-160 MG tablet Take 1 tablet by mouth 2 (two) times daily. 28 tablet 0   tamsulosin (FLOMAX) 0.4 MG CAPS capsule Take 0.4  mg by mouth daily.     No current facility-administered medications on file prior to visit.    ALLERGIES: Allergies  Allergen Reactions   Penicillins Hives    As a child     FAMILY HISTORY: Family History  Problem Relation Age of Onset   Hypertension Mother    Hypertension Father    Multiple sclerosis Sister    Aneurysm Brother       Objective:  *** General: No acute distress.  Patient appears well-groomed.   Head:  Normocephalic/atraumatic Eyes:  Fundi examined but not visualized Neck: supple, no paraspinal tenderness, full range of motion Heart:  Regular rate and rhythm Lungs:  Clear to auscultation bilaterally Back: No paraspinal tenderness Neurological Exam: Alert and oriented.  Speech fluent and not dysarthric.  Language intact.  CN II-XII intact.  Tone slightly increased.  Muscle strength 5/5 throughout.  Sensation to light touch intact.  Deep tendon reflexes 2+ throughout except 3+ in knees.  Finger to nose testing intact.  Slight broad-based gait.  Romberg with sway.   Shon Millet, DO  CC: Joshua Nones, FNP

## 2022-08-03 ENCOUNTER — Encounter: Payer: Self-pay | Admitting: Neurology

## 2022-08-03 ENCOUNTER — Other Ambulatory Visit: Payer: Self-pay | Admitting: Neurology

## 2022-08-03 ENCOUNTER — Other Ambulatory Visit (INDEPENDENT_AMBULATORY_CARE_PROVIDER_SITE_OTHER): Payer: 59

## 2022-08-03 ENCOUNTER — Ambulatory Visit: Payer: 59 | Admitting: Neurology

## 2022-08-03 VITALS — BP 110/67 | HR 78 | Ht 67.0 in | Wt 144.0 lb

## 2022-08-03 DIAGNOSIS — G35 Multiple sclerosis: Secondary | ICD-10-CM

## 2022-08-03 LAB — VITAMIN B12: Vitamin B-12: 406 pg/mL (ref 211–911)

## 2022-08-03 LAB — VITAMIN D 25 HYDROXY (VIT D DEFICIENCY, FRACTURES): VITD: 25.72 ng/mL — ABNORMAL LOW (ref 30.00–100.00)

## 2022-08-03 MED ORDER — MODAFINIL 100 MG PO TABS: 100.0000 mg | ORAL_TABLET | Freq: Every morning | ORAL | 5 refills | Status: DC

## 2022-08-03 MED ORDER — NORTRIPTYLINE HCL 50 MG PO CAPS: 50.0000 mg | ORAL_CAPSULE | Freq: Every day | ORAL | 5 refills | Status: DC

## 2022-08-03 NOTE — Patient Instructions (Signed)
Continue Ocrevus Restart nortriptyline 50mg  at bedtime and modafinil 100mg  in morning Take over the counter D3 4000 units daily Repeat MRIs and labs in 6 months Check B12 level today

## 2022-08-04 LAB — IGG, IGA, IGM
IgG (Immunoglobin G), Serum: 1245 mg/dL (ref 600–1640)
IgM, Serum: 38 mg/dL — ABNORMAL LOW (ref 50–300)
Immunoglobulin A: 165 mg/dL (ref 47–310)

## 2022-08-04 LAB — CBC WITH DIFFERENTIAL
Basophils Absolute: 0.1 10*3/uL (ref 0.0–0.2)
Basos: 1 %
EOS (ABSOLUTE): 0.1 10*3/uL (ref 0.0–0.4)
Eos: 1 %
Hematocrit: 42.4 % (ref 37.5–51.0)
Hemoglobin: 14.5 g/dL (ref 13.0–17.7)
Immature Grans (Abs): 0 10*3/uL (ref 0.0–0.1)
Immature Granulocytes: 0 %
Lymphocytes Absolute: 2.4 10*3/uL (ref 0.7–3.1)
Lymphs: 27 %
MCH: 30.6 pg (ref 26.6–33.0)
MCHC: 34.2 g/dL (ref 31.5–35.7)
MCV: 90 fL (ref 79–97)
Monocytes Absolute: 0.6 10*3/uL (ref 0.1–0.9)
Monocytes: 6 %
Neutrophils Absolute: 6 10*3/uL (ref 1.4–7.0)
Neutrophils: 65 %
RBC: 4.74 x10E6/uL (ref 4.14–5.80)
RDW: 12.3 % (ref 11.6–15.4)
WBC: 9.1 10*3/uL (ref 3.4–10.8)

## 2022-08-16 ENCOUNTER — Encounter: Payer: Self-pay | Admitting: Neurology

## 2022-09-13 NOTE — Progress Notes (Unsigned)
Referring Physician:  No referring provider defined for this encounter.  Primary Physician:  Armando Gang, FNP  History of Present Illness: 09/14/2022 Joshua [name] has a history of MS.   He had ACDF C3-C4 on 02/20/20 by Dr. Myer Haff and C4-C7 cervical fusion by Dr. Gerlene Fee in 2014.   Last seen by me on 04/08/22 with posterior neck pain with some radiation to shoulders. No arm pain. He has numbness and tingling in both arms. Cervical xrays looked okay.   He was sent to PT (did not go due to copay).   He is here for follow up.   He has constant neck pain with left arm pain into his arm pit and to the elbow- this pain is severe when he looks down. He feels better with left arm flexed over his head. Left arm pain started about a week ago. Neck pain is chronic.   He has known severe ulnar compression and carpal tunnel syndrome bilaterally. He was scheduled for carpal tunnel and ulnar nerve release on left but cannot afford to get it done. He also needs this done on the right as well. Is planning to get money to pay [facility] for the left side in next week.   He continues on prednisone 20mg  bid- this is the only thing that helps.   Conservative measures:  Physical therapy: has participated in, but years ago.  Multimodal medical therapy including regular antiinflammatories: tylenol, ibuprofen, topical lidocaine, flexeril, lyrica, prednisone Injections:  07/31/20 left C2-C3 and C5-C6 facet injections Mariah Milling) 09/05/19: C6-7 ESI (Dr. Cherylann Ratel) helped for 2-3 days 07/18/19: C6-7 ESI(Dr. Cherylann Ratel) helped for 2-3 days  Past Surgery:  ACDF C3-C4 on 02/20/20 by Dr. Myer Haff 2014 C4-C7 cervical fusion by Dr. Gerlene Fee   Review of Systems:  A 10 point review of systems is negative, except for the pertinent positives and negatives detailed in the HPI.  Past Medical History: Past Medical History:  Diagnosis Date   Allergy    Anxiety    Depression    Multiple sclerosis (HCC)    Neck pain     Neuromuscular disorder (HCC)     Past Surgical History: Past Surgical History:  Procedure Laterality Date   ANTERIOR CERVICAL DECOMP/DISCECTOMY FUSION N/A 02/20/2020   Procedure: ANTERIOR CERVICAL DECOMPRESSION/DISCECTOMY FUSION 1 LEVEL C3-4;  Surgeon: Venetia Night, MD;  Location: [facility] ORS;  Service: Neurosurgery;  Laterality: N/A;   COLONOSCOPY N/A 05/24/2022   Procedure: COLONOSCOPY;  Surgeon: Jaynie Collins, DO;  Location: [facility] ENDOSCOPY;  Service: Gastroenterology;  Laterality: N/A;   COLONOSCOPY N/A 06/10/2022   Procedure: COLONOSCOPY;  Surgeon: Jaynie Collins, DO;  Location: [facility] ENDOSCOPY;  Service: Gastroenterology;  Laterality: N/A;   CRANIOPLASTY  2005   subdural hematoma  that had to be removed   NECK SURGERY  2014   SPINAL FUSION      Allergies: Allergies as of 09/14/2022 - Review Complete 09/14/2022  Allergen Reaction Noted   Penicillins Hives 02/12/2015    Medications: Outpatient Encounter Medications as of 09/14/2022  Medication Sig   megestrol (MEGACE) 40 MG tablet Take 40 mg by mouth daily.   modafinil (PROVIGIL) 100 MG tablet Take 1 tablet (100 mg total) by mouth every morning.   nortriptyline (PAMELOR) 50 MG capsule Take 1 capsule (50 mg total) by mouth at bedtime.   predniSONE (DELTASONE) 20 MG tablet Take 20 mg by mouth 2 (two) times daily.   tamsulosin (FLOMAX) 0.4 MG CAPS capsule Take 0.4 mg by mouth daily.  varenicline (CHANTIX) 0.5 MG tablet Take 0.5 mg by mouth 2 (two) times daily.   No facility-administered encounter medications on file as of 09/14/2022.    Social History: Social History   Tobacco Use   Smoking status: Former    Current packs/day: 0.00    Average packs/day: 1 pack/day for 20.0 years (20.0 ttl pk-yrs)    Types: Cigarettes    Quit date: 08/2022    Years since quitting: 0.1   Smokeless tobacco: Never   Tobacco comments:    uses Chantix , also smoke marijuana  Vaping Use   Vaping status: Never Used   Substance Use Topics   Alcohol use: Yes    Comment: occasionally,none last 24   Drug use: Yes    Frequency: 7.0 times per week    Types: Marijuana    Family Medical History: Family History  Problem Relation Age of Onset   Hypertension Mother    Hypertension Father    Multiple sclerosis Sister    Aneurysm Brother     Physical Examination: Vitals:   09/14/22 1109  BP: 115/72    Awake, alert, oriented to person, place, and time.  Speech is clear and fluent. Fund of knowledge is appropriate.   Cranial Nerves: Pupils equal round and reactive to light.  Facial tone is symmetric.    Limited ROM of cervical spine with pain  No posterior cervical tenderness. No trapezial tenderness.   Cervical incisions well healed.   No abnormal lesions on exposed skin.   Strength: Side Biceps Triceps Deltoid Interossei Grip Wrist Ext. Wrist Flex.  R 5 5 5 5 5 5 5   L 5 5 5 5 5 5 5    Side Iliopsoas Quads Hamstring PF DF EHL  R 5 5 5 5 5 5   L 5 5 5 5 5 5    Reflexes are 2+ and symmetric at the biceps, triceps, brachioradialis, patella and achilles.   Hoffman's is absent.  Clonus is not present.   Bilateral upper and lower extremity sensation is intact to light touch.     Good ROM of both shoulders with no pain.   Gait is normal.    Medical Decision Making  Imaging: Xrays of cervical spine dated 04/08/22:  FINDINGS: An anterior plate is affixed to the cervical spine at C3-4 with a disc spacer device at this level. Hardware is stable.   A disc spacer device is identified at C4-5, also stable.   An anterior plate is affixed to the cervical spine at C6-7 with a disc spacer device at this level. Hardware is in stable position. No other bony or soft tissue abnormalities are noted.   IMPRESSION: 1. Anterior plate and disc spacer device at C3-4. Hardware is in stable position. 2. Stable disc spacer device at C4-5. 3. Stable anterior plate and disc spacer device at C6-7.      Electronically Signed   By: Gerome Sam III M.D.   On: 04/10/2022 11:00  I have personally reviewed the images and agree with the above interpretation.   Assessment and Plan: Joshua [name] is a pleasant 52 y.o. male has history of ACDF C3-C4 on 02/20/20 by Dr. Myer Haff and C4-C7 cervical fusion by Dr. Gerlene Fee in 2014.   He has constant chronic neck pain with new left arm pain into his arm pit and to the elbow- this pain is severe when he looks down. He feels better with left arm flexed over his head.  He has known severe ulnar compression and  carpal tunnel syndrome bilaterally. He has pain and numbness, tingling, and weakness in both arms from the elbows into the hands.   Previous cervical xrays looked okay.  Treatment options discussed with patient and following plan made:   - MRI of cervical spine to further evaluate cervical radiculopathy.  - Follow up with [facility] for his ulnar compression/carpal tunnel.  - Will schedule follow up with me to review MRI once it is done.  - He also has f/u with Dr. Myer Haff in October.   I spent a total of 25 minutes in face-to-face and non-face-to-face activities related to this patient's care today including review of outside records, review of imaging, review of symptoms, physical exam, discussion of differential diagnosis, discussion of treatment options, and documentation.   Drake Leach PA-C Dept. of Neurosurgery

## 2022-09-14 ENCOUNTER — Ambulatory Visit: Payer: 59 | Admitting: Orthopedic Surgery

## 2022-09-14 ENCOUNTER — Encounter: Payer: Self-pay | Admitting: Orthopedic Surgery

## 2022-09-14 VITALS — BP 115/72 | Ht 67.0 in | Wt 144.0 lb

## 2022-09-14 DIAGNOSIS — Z981 Arthrodesis status: Secondary | ICD-10-CM

## 2022-09-14 DIAGNOSIS — M542 Cervicalgia: Secondary | ICD-10-CM | POA: Diagnosis not present

## 2022-09-14 DIAGNOSIS — M5412 Radiculopathy, cervical region: Secondary | ICD-10-CM | POA: Diagnosis not present

## 2022-09-14 DIAGNOSIS — G894 Chronic pain syndrome: Secondary | ICD-10-CM

## 2022-09-14 NOTE — Patient Instructions (Signed)
It was so nice to see you today. Thank you so much for coming in.    I want to get an MRI of your neck to look into things further. We will get this approved through your insurance and [facility] will call you to schedule the appointment. [facility] (building with the white pillars) is off of [address]. The address is [address], [place], [place] [address].  After you have the MRI, it takes 5-7 days for me to get the results back. Once I have them, we will call you to schedule a follow up visit with me to review them.   Please do not hesitate to call if you have any questions or concerns. You can also message me in MyChart.   If you have not heard back about any of the MRI in the next week, please call the office so we can help you get it scheduled.   Drake Leach PA-C [phone]

## 2022-09-16 ENCOUNTER — Emergency Department
Admission: EM | Admit: 2022-09-16 | Discharge: 2022-09-16 | Disposition: A | Discharge status: Home / Self Care | Payer: 59 | Attending: Emergency Medicine | Admitting: Emergency Medicine

## 2022-09-16 ENCOUNTER — Other Ambulatory Visit: Payer: Self-pay

## 2022-09-16 ENCOUNTER — Encounter: Payer: Self-pay | Admitting: Emergency Medicine

## 2022-09-16 ENCOUNTER — Emergency Department: Payer: 59

## 2022-09-16 DIAGNOSIS — M25511 Pain in right shoulder: Secondary | ICD-10-CM | POA: Diagnosis present

## 2022-09-16 DIAGNOSIS — M7541 Impingement syndrome of right shoulder: Secondary | ICD-10-CM | POA: Diagnosis not present

## 2022-09-16 MED ORDER — MELOXICAM 15 MG PO TABS: 15.0000 mg | ORAL_TABLET | Freq: Every day | ORAL | 0 refills | Status: DC

## 2022-09-16 MED ORDER — PREDNISONE 20 MG PO TABS
60.0000 mg | ORAL_TABLET | Freq: Once | ORAL | Status: AC
  Administered 2022-09-16: 60 mg via ORAL
  Filled 2022-09-16: qty 3

## 2022-09-16 MED ORDER — HYDROCODONE-ACETAMINOPHEN 5-325 MG PO TABS: 1.0000 | ORAL_TABLET | ORAL | 0 refills | Status: DC | PRN

## 2022-09-16 MED ORDER — HYDROCODONE-ACETAMINOPHEN 5-325 MG PO TABS
1.0000 | ORAL_TABLET | Freq: Once | ORAL | Status: AC
  Administered 2022-09-16: 1 via ORAL
  Filled 2022-09-16: qty 1

## 2022-09-16 MED ORDER — PREDNISONE 50 MG PO TABS: 50.0000 mg | ORAL_TABLET | Freq: Every day | ORAL | 0 refills | Status: DC

## 2022-09-16 MED ORDER — KETOROLAC TROMETHAMINE 30 MG/ML IJ SOLN
30.0000 mg | Freq: Once | INTRAMUSCULAR | Status: AC
  Administered 2022-09-16: 30 mg via INTRAMUSCULAR
  Filled 2022-09-16: qty 1

## 2022-09-16 NOTE — ED Provider Notes (Signed)
[facility] Provider Note  Patient Contact: 4:22 PM (approximate)   History   Shoulder Pain   HPI  Joshua [name] is a 52 y.o. male who presents emergency department complaining of right flank pain.  Patient is a history of cervical degenerative affecting the left arm.  Lifting something and felt sharp pain in his right shoulder.  Chronic pain along the anterior aspect of the shoulder.     Physical Exam   Triage Vital Signs: ED Triage Vitals  Encounter Vitals Group     BP 09/16/22 1303 132/74     Systolic BP Percentile --      Diastolic BP Percentile --      Pulse Rate 09/16/22 1303 84     Resp 09/16/22 1303 18     Temp 09/16/22 1303 98.4 F (36.9 C)     Temp Source 09/16/22 1303 Oral     SpO2 09/16/22 1303 98 %     Weight 09/16/22 1259 146 lb (66.2 kg)     Height 09/16/22 1259 5\' 7"  (1.702 m)     Head Circumference --      Peak Flow --      Pain Score 09/16/22 1259 10     Pain Loc --      Pain Education --      Exclude from Growth Chart --     Most recent vital signs: Vitals:   09/16/22 1303 09/16/22 1625  BP: 132/74 (!) 125/100  Pulse: 84 82  Resp: 18 18  Temp: 98.4 F (36.9 C) 98 F (36.7 C)  SpO2: 98% 100%     General: Alert and in no acute distress. Neck: No stridor. No cervical spine tenderness to palpation.  Cardiovascular:  Good peripheral perfusion Respiratory: Normal respiratory effort without tachypnea or retractions. Lungs CTAB. Good air entry to the bases with no decreased or absent breath sounds. Musculoskeletal: Full range of motion to all extremities.  Tenderness over the [facility] joint.  No palpable abnormality.  No other significant tenderness to palpation over the right shoulder.  Limited range of motion due to pain.  Pulses sensation intact distally. Neurologic:  No gross focal neurologic deficits are appreciated.  Skin:   No rash noted Other:   ED Results / Procedures / Treatments   Labs (all labs ordered  are listed, but only abnormal results are displayed) Labs Reviewed - No data to display   EKG     RADIOLOGY  I personally viewed, evaluated, and interpreted these images as part of my medical decision making, as well as reviewing the written report by the radiologist.  ED Provider Interpretation: No acute findings on x-ray.  DG Shoulder Right  Result Date: 09/16/2022 CLINICAL DATA:  Shoulder injury, sudden onset of right shoulder pain. EXAM: RIGHT SHOULDER - 2+ VIEW COMPARISON:  None Available. FINDINGS: There is no evidence of fracture or dislocation. The alignment and joint spaces are preserved. There is no evidence of arthropathy or other focal bone abnormality. Soft tissues are unremarkable. IMPRESSION: Negative radiographs of the right shoulder. Electronically Signed   By: Narda Rutherford M.D.   On: 09/16/2022 13:46    PROCEDURES:  Critical Care performed: No  Procedures   MEDICATIONS ORDERED IN ED: Medications  predniSONE (DELTASONE) tablet 60 mg (60 mg Oral Given 09/16/22 1631)  ketorolac (TORADOL) 30 MG/ML injection 30 mg (30 mg Intramuscular Given 09/16/22 1632)  HYDROcodone-acetaminophen (NORCO/VICODIN) 5-325 MG per tablet 1 tablet (1 tablet Oral Given 09/16/22 1631)  IMPRESSION / MDM / ASSESSMENT AND PLAN / ED COURSE  I reviewed the triage vital signs and the nursing notes.                                 Differential diagnosis includes, but is not limited to, impingement syndrome, rotator cuff tear, shoulder dislocation, cervical radiculopathy   Patient's presentation is most consistent with acute presentation with potential threat to life or bodily function.   Patient's diagnosis is consistent with impingement syndrome of the shoulder.  Patient presents the emergency department complaining of right shoulder pain.  Patient was lifting something heavy when he felt pain in his shoulder.  Since then he has had pain along the anterior aspect on palpation he was  tender over the [facility] joint.  Patient would not perform range of motion due to pain though there is good passive range of motion.  Imaging is reassuring.  Anti-inflammatory, steroid limited pain medication prescribed.  Follow-up with orthopedics..  Patient is given ED precautions to return to the ED for any worsening or new symptoms.     FINAL CLINICAL IMPRESSION(S) / ED DIAGNOSES   Final diagnoses:  Impingement syndrome of right shoulder     Rx / DC Orders   ED Discharge Orders          Ordered    predniSONE (DELTASONE) 50 MG tablet  Daily with breakfast        09/16/22 1616    meloxicam (MOBIC) 15 MG tablet  Daily        09/16/22 1616    HYDROcodone-acetaminophen (NORCO/VICODIN) 5-325 MG tablet  Every 4 hours PRN        09/16/22 1616             Note:  This document was prepared using Dragon voice recognition software and may include unintentional dictation errors.   Lanette Hampshire 09/16/22 1635    Chesley Noon, MD 09/16/22 [phone]

## 2022-09-16 NOTE — ED Triage Notes (Signed)
Pt via POV from home. Pt has a hx of a pinched nerve on the L side causing L shoulder pain, states he has been using his R arm to help pick things up, he was picking something up and had a sudden onset of pain to the R shoulder starting yesterday. Pt has a hx of MS. Pt is A&Ox4 and NAD

## 2022-09-20 ENCOUNTER — Telehealth: Payer: Self-pay | Admitting: Orthopedic Surgery

## 2022-09-20 DIAGNOSIS — G894 Chronic pain syndrome: Secondary | ICD-10-CM

## 2022-09-20 DIAGNOSIS — Z981 Arthrodesis status: Secondary | ICD-10-CM

## 2022-09-20 DIAGNOSIS — M5412 Radiculopathy, cervical region: Secondary | ICD-10-CM

## 2022-09-20 MED ORDER — DIAZEPAM 5 MG PO TABS
ORAL_TABLET | ORAL | 0 refills | Status: DC
Start: 2022-09-20 — End: 2022-09-28

## 2022-09-20 NOTE — Telephone Encounter (Signed)
Patient notified and states he will have a driver.

## 2022-09-20 NOTE — Telephone Encounter (Signed)
Mri 09/27/2022 [facility] Something to calm him down for the scan.

## 2022-09-20 NOTE — Telephone Encounter (Signed)
PMP reviewed and is appropriate.   Valium sent to pharmacy for MRI. Please let him know and remind him that he will need a driver.

## 2022-09-27 ENCOUNTER — Ambulatory Visit
Admission: RE | Admit: 2022-09-27 | Discharge: 2022-09-27 | Disposition: A | Discharge status: Home / Self Care | Payer: 59 | Source: Ambulatory Visit | Attending: Orthopedic Surgery | Admitting: Orthopedic Surgery

## 2022-09-27 DIAGNOSIS — Z981 Arthrodesis status: Secondary | ICD-10-CM

## 2022-09-27 DIAGNOSIS — G894 Chronic pain syndrome: Secondary | ICD-10-CM

## 2022-09-27 DIAGNOSIS — M5412 Radiculopathy, cervical region: Secondary | ICD-10-CM

## 2022-09-27 DIAGNOSIS — M542 Cervicalgia: Secondary | ICD-10-CM | POA: Diagnosis present

## 2022-09-28 ENCOUNTER — Telehealth: Payer: Self-pay | Admitting: Orthopedic Surgery

## 2022-09-28 ENCOUNTER — Telehealth: Payer: Self-pay | Admitting: Family Medicine

## 2022-09-28 DIAGNOSIS — Z981 Arthrodesis status: Secondary | ICD-10-CM

## 2022-09-28 DIAGNOSIS — M5412 Radiculopathy, cervical region: Secondary | ICD-10-CM

## 2022-09-28 DIAGNOSIS — G894 Chronic pain syndrome: Secondary | ICD-10-CM

## 2022-09-28 MED ORDER — METHOCARBAMOL 500 MG PO TABS
500.0000 mg | ORAL_TABLET | Freq: Three times a day (TID) | ORAL | 0 refills | Status: DC | PRN
Start: 2022-09-28 — End: 2022-11-23

## 2022-09-28 NOTE — Telephone Encounter (Signed)
Error

## 2022-09-28 NOTE — Addendum Note (Signed)
Addended byDrake Leach on: 09/28/2022 11:57 AM   Modules accepted: Orders

## 2022-09-28 NOTE — Telephone Encounter (Signed)
Spoke with the patient advised that we only prescribe  pain medication for short term after surgery , [name]'s recommendation to help with the pian is muscle relaxer's . He stated he is in pain has nothing to do with his muscle however was okay to have the Methocarbamol filled to help with pain.  Advised I will give to Joshua [name] to have medication filled at [facility] on [address]. Patient agreed

## 2022-09-28 NOTE — Telephone Encounter (Signed)
Patient has called to discuss his MRI. He knows the results are not back in yet but he says he is still having pain and would like to know what to do in the mean time, please advise.   Patient will also bring over some disability paperwork to be filled out by the provider- last OV 8.13.24

## 2022-09-28 NOTE — Telephone Encounter (Signed)
Robaxin sent to pharmacy

## 2022-09-28 NOTE — Telephone Encounter (Signed)
It will take 7-10 days for me to get MRI results back.   Can try adding a muscle relaxer. Looks like he has taken methocarbamol and tizanidine in the past. Does he want to do one of these?   If these did not help, then can try cyclobenzaprine.   Let me know.

## 2022-09-30 ENCOUNTER — Telehealth: Payer: Self-pay | Admitting: Orthopedic Surgery

## 2022-09-30 NOTE — Telephone Encounter (Signed)
Can you place a referral for right shoulder pain to [facility].

## 2022-09-30 NOTE — Telephone Encounter (Signed)
ED advised him to follow up with Dr. Allena Katz at [facility] ortho. He should not need a referral from me.

## 2022-10-01 ENCOUNTER — Other Ambulatory Visit: Payer: Self-pay

## 2022-10-01 ENCOUNTER — Emergency Department: Payer: 59

## 2022-10-01 ENCOUNTER — Emergency Department
Admission: EM | Admit: 2022-10-01 | Discharge: 2022-10-01 | Disposition: A | Discharge status: Home / Self Care | Payer: 59 | Source: Home / Self Care | Attending: Emergency Medicine | Admitting: Emergency Medicine

## 2022-10-01 DIAGNOSIS — R531 Weakness: Secondary | ICD-10-CM | POA: Insufficient documentation

## 2022-10-01 DIAGNOSIS — X500XXA Overexertion from strenuous movement or load, initial encounter: Secondary | ICD-10-CM | POA: Insufficient documentation

## 2022-10-01 DIAGNOSIS — M25511 Pain in right shoulder: Secondary | ICD-10-CM | POA: Insufficient documentation

## 2022-10-01 DIAGNOSIS — G8911 Acute pain due to trauma: Secondary | ICD-10-CM | POA: Diagnosis not present

## 2022-10-01 MED ORDER — KETOROLAC TROMETHAMINE 30 MG/ML IJ SOLN
30.0000 mg | Freq: Once | INTRAMUSCULAR | Status: AC
  Administered 2022-10-01: 30 mg via INTRAMUSCULAR
  Filled 2022-10-01: qty 1

## 2022-10-01 MED ORDER — PREDNISONE 10 MG PO TABS: ORAL_TABLET | ORAL | 0 refills | Status: DC

## 2022-10-01 NOTE — ED Triage Notes (Signed)
Pt presents to ED with c/o of R shoulder pain that has bee ongoing for 2 weeks. Pt states had a xray 2 weeks ago. Pt states MRI Monday for neck.

## 2022-10-01 NOTE — ED Provider Notes (Cosign Needed Addendum)
[facility] DEPARTMENT AT [facility] Provider Note   CSN: [id number] Arrival date & time: 10/01/22  1628     History  Chief Complaint  Patient presents with   Shoulder Pain    Joshua [name] is a 52 y.o. male.  Presents to the emergency department for evaluation of right shoulder pain.  Patient states had shoulder pain a couple weeks ago after lifting something heavy and felt a pop in his shoulder.  He recently was going to let the dogs out and the dog gate hit his right shoulder and has been having increased pain.  He has been being evaluated by neurosurgery for his neck and left-sided cervical radiculopathy symptoms that he said ongoing for months.  Denies any radiculopathy symptoms in the right upper extremity.  HPI     Home Medications Prior to Admission medications   Medication Sig Start Date End Date Taking? Authorizing Provider  predniSONE (DELTASONE) 10 MG tablet 10 day taper. 5,5,4,4,3,3,2,2,1,1 10/01/22  Yes Evon Slack, PA-C  HYDROcodone-acetaminophen (NORCO/VICODIN) 5-325 MG tablet Take 1 tablet by mouth every 4 (four) hours as needed for moderate pain. 09/16/22 09/16/23  Cuthriell, Delorise Royals, PA-C  megestrol (MEGACE) 40 MG tablet Take 40 mg by mouth daily. 05/28/22   [provider]  meloxicam (MOBIC) 15 MG tablet Take 1 tablet (15 mg total) by mouth daily. 09/16/22 09/16/23  Cuthriell, Delorise Royals, PA-C  methocarbamol (ROBAXIN) 500 MG tablet Take 1 tablet (500 mg total) by mouth every 8 (eight) hours as needed for muscle spasms. 09/28/22   Drake Leach, PA-C  modafinil (PROVIGIL) 100 MG tablet Take 1 tablet (100 mg total) by mouth every morning. 08/03/22   Drema Dallas, DO  nortriptyline (PAMELOR) 50 MG capsule Take 1 capsule (50 mg total) by mouth at bedtime. 08/03/22   Drema Dallas, DO  tamsulosin (FLOMAX) 0.4 MG CAPS capsule Take 0.4 mg by mouth daily. 02/04/22   [provider]  varenicline (CHANTIX) 0.5 MG tablet Take 0.5 mg by mouth  2 (two) times daily.    [provider]      Allergies    Penicillins    Review of Systems   Review of Systems  Physical Exam Updated Vital Signs BP (!) 122/94 (BP Location: Right Arm)   Pulse 93   Temp 98.7 F (37.1 C) (Oral)   Resp 18   Ht 5\' 7"  (1.702 m)   Wt 66.2 kg   SpO2 98%   BMI 22.86 kg/m  Physical Exam Constitutional:      Appearance: He is well-developed.  HENT:     Head: Normocephalic and atraumatic.  Eyes:     Conjunctiva/sclera: Conjunctivae normal.  Cardiovascular:     Rate and Rhythm: Normal rate.  Pulmonary:     Effort: Pulmonary effort is normal. No respiratory distress.  Musculoskeletal:        General: Normal range of motion.     Cervical back: Normal range of motion. No rigidity or tenderness.     Comments: Examination of the right upper extremity shows patient is neuro vastly intact.  2+ radial pulse and 2+ cap refill.  Good bicep tricep and grip strength.  He has weakness with abduction, flexion of the shoulder.  He has no swelling warmth or redness.  He has positive Hawkins and impingement test.  Good active and passive internal and external rotation of the shoulder.  Passively I can get him to 100 degrees of abduction with pain.  Positive  drop arm test.  Skin:    General: Skin is warm.     Findings: No rash.  Neurological:     General: No focal deficit present.     Mental Status: He is alert and oriented to person, place, and time.  Psychiatric:        Behavior: Behavior normal.        Thought Content: Thought content normal.   Patient  ED Results / Procedures / Treatments   Labs (all labs ordered are listed, but only abnormal results are displayed) Labs Reviewed - No data to display  EKG None  Radiology DG Shoulder Right  Result Date: 10/01/2022 CLINICAL DATA:  Right shoulder pain for 2 weeks EXAM: RIGHT SHOULDER - 2+ VIEW COMPARISON:  09/16/2022 FINDINGS: Internal rotation, external rotation, transscapular views of the  right shoulder are obtained. No fracture, subluxation, or dislocation. Joint spaces are well preserved. Soft tissues are normal. Right chest is clear. IMPRESSION: 1. Stable unremarkable right shoulder. Electronically Signed   By: Sharlet Salina M.D.   On: 10/01/2022 18:46    Procedures Procedures    Medications Ordered in ED Medications  ketorolac (TORADOL) 30 MG/ML injection 30 mg (30 mg Intramuscular Given 10/01/22 1835)    ED Course/ Medical Decision Making/ A&P                                 Medical Decision Making Amount and/or Complexity of Data Reviewed Radiology: ordered.  Risk Prescription drug management.   52 year old male with acute right shoulder pain.  Has had a couple injuries 1 a lifting injury and the second was blunt force low impact injury with persistent right shoulder pain.  His exam is concerning for possible rotator cuff tear as he has weakness with abduction.  He has some left-sided cervical radiculopathy/myelopathy symptoms that been present for greater than 1 month.  Has had a recent MRI of his C-spine and is being followed by neurosurgery.  Is not having any cervical radiculopathy symptoms in the right upper extremity.  His x-ray of his shoulder on the right side ordered and reviewed by me today show no evidence of acute bony abnormality but he does have decreased subacromial space, spurring with a downsloping acromion.  Based on his history and exam concern for rotator cuff tear.  Will place into a sling today and placed on a 10-day steroid taper.  Will have him call orthopedics to schedule follow-up appointment.  He understands signs symptoms return to the ER for. Final Clinical Impression(s) / ED Diagnoses Final diagnoses:  Acute pain of right shoulder    Rx / DC Orders ED Discharge Orders          Ordered    predniSONE (DELTASONE) 10 MG tablet        10/01/22 1934              Evon Slack, PA-C 10/01/22 1936    Evon Slack,  PA-C 10/01/22 Mable Paris, MD 10/02/22 [phone]

## 2022-10-01 NOTE — Telephone Encounter (Signed)
Patient notified to call [facility]

## 2022-10-18 NOTE — Progress Notes (Addendum)
Referring Physician:  No referring provider defined for this encounter.  Primary Physician:  Armando Gang, FNP  History of Present Illness: Joshua [name] has a history of MS.   He had ACDF C3-C4 on 02/20/20 by Dr. Myer Haff and C4-C7 cervical fusion by Dr. Gerlene Fee in 2014.   Last seen by me on 09/14/22 with constant chronic neck pain with new left arm pain into his arm pit and to the elbow. Previous cervical xrays looked okay.    He has known severe ulnar compression and carpal tunnel syndrome bilaterally. He has pain and numbness, tingling, and weakness in both arms from the elbows into the hands.   Has seen ortho at [facility] for right shoulder pain and MRI was ordered. He as to take norco for pain.   Cervical MRI ordered and he is here to review it.   He has constant neck pain with left arm pain into his arm pit and to the elbow. He feels better with left arm flexed over his head.    He continues with right shoulder pain- has not had MRI scheduled yet.   He has known severe ulnar compression and carpal tunnel syndrome bilaterally. He was scheduled for carpal tunnel and ulnar nerve release on left but cannot afford to get it done ([facility] wanted prepayment). He also needs this done on the right as well.   PCP has him on intermittent prednisone. It's the only thing that helps.   Conservative measures:  Physical therapy: has participated in, but years ago.  Multimodal medical therapy including regular antiinflammatories: tylenol, ibuprofen, topical lidocaine, flexeril, lyrica, prednisone Injections:  07/31/20 left C2-C3 and C5-C6 facet injections Mariah Milling) 09/05/19: C6-7 ESI (Dr. Cherylann Ratel) helped for 2-3 days 07/18/19: C6-7 ESI(Dr. Cherylann Ratel) helped for 2-3 days  Past Surgery:  ACDF C3-C4 on 02/20/20 by Dr. Myer Haff 2014 C4-C7 cervical fusion by Dr. Gerlene Fee   Review of Systems:  A 10 point review of systems is negative, except for the pertinent positives and negatives detailed in  the HPI.  Past Medical History: Past Medical History:  Diagnosis Date   Allergy    Anxiety    Depression    Multiple sclerosis (HCC)    Neck pain    Neuromuscular disorder (HCC)     Past Surgical History: Past Surgical History:  Procedure Laterality Date   ANTERIOR CERVICAL DECOMP/DISCECTOMY FUSION N/A 02/20/2020   Procedure: ANTERIOR CERVICAL DECOMPRESSION/DISCECTOMY FUSION 1 LEVEL C3-4;  Surgeon: Venetia Night, MD;  Location: [facility] ORS;  Service: Neurosurgery;  Laterality: N/A;   COLONOSCOPY N/A 05/24/2022   Procedure: COLONOSCOPY;  Surgeon: Jaynie Collins, DO;  Location: [facility] ENDOSCOPY;  Service: Gastroenterology;  Laterality: N/A;   COLONOSCOPY N/A 06/10/2022   Procedure: COLONOSCOPY;  Surgeon: Jaynie Collins, DO;  Location: [facility] ENDOSCOPY;  Service: Gastroenterology;  Laterality: N/A;   CRANIOPLASTY  2005   subdural hematoma  that had to be removed   NECK SURGERY  2014   SPINAL FUSION      Allergies: Allergies as of 10/26/2022 - Review Complete 10/01/2022  Allergen Reaction Noted   Penicillins Hives 02/12/2015    Medications: Outpatient Encounter Medications as of 10/26/2022  Medication Sig   HYDROcodone-acetaminophen (NORCO/VICODIN) 5-325 MG tablet Take 1 tablet by mouth every 4 (four) hours as needed for moderate pain.   megestrol (MEGACE) 40 MG tablet Take 40 mg by mouth daily.   meloxicam (MOBIC) 15 MG tablet Take 1 tablet (15 mg total) by mouth daily.   methocarbamol (ROBAXIN) 500  MG tablet Take 1 tablet (500 mg total) by mouth every 8 (eight) hours as needed for muscle spasms.   modafinil (PROVIGIL) 100 MG tablet Take 1 tablet (100 mg total) by mouth every morning.   nortriptyline (PAMELOR) 50 MG capsule Take 1 capsule (50 mg total) by mouth at bedtime.   predniSONE (DELTASONE) 10 MG tablet 10 day taper. 5,5,4,4,3,3,2,2,1,1   tamsulosin (FLOMAX) 0.4 MG CAPS capsule Take 0.4 mg by mouth daily.   varenicline (CHANTIX) 0.5 MG tablet Take 0.5 mg by  mouth 2 (two) times daily.   No facility-administered encounter medications on file as of 10/26/2022.    Social History: Social History   Tobacco Use   Smoking status: Former    Current packs/day: 0.00    Average packs/day: 1 pack/day for 20.0 years (20.0 ttl pk-yrs)    Types: Cigarettes    Quit date: 08/2022    Years since quitting: 0.2   Smokeless tobacco: Never   Tobacco comments:    uses Chantix , also smoke marijuana  Vaping Use   Vaping status: Never Used  Substance Use Topics   Alcohol use: Yes    Comment: occasionally,none last 24   Drug use: Yes    Frequency: 7.0 times per week    Types: Marijuana    Family Medical History: Family History  Problem Relation Age of Onset   Hypertension Mother    Hypertension Father    Multiple sclerosis Sister    Aneurysm Brother     Physical Examination: There were no vitals filed for this visit.  Awake, alert, oriented to person, place, and time.  Speech is clear and fluent. Fund of knowledge is appropriate.   Cranial Nerves: Pupils equal round and reactive to light.  Facial tone is symmetric.    Limited ROM of cervical spine with pain  Cervical incisions well healed.   No abnormal lesions on exposed skin.   Strength: Side Biceps Triceps Deltoid Interossei Grip Wrist Ext. Wrist Flex.  R 5 5 --- 5 5 5 5   L 5 5 5 5 5 5 5     Strength of right deltoid not tested due to shoulder pain.   Reflexes are 2+ and symmetric at the biceps, brachioradialis. Hoffman's is absent.   Bilateral upper extremity sensation is intact to light touch.     Gait is normal.    Medical Decision Making  Imaging: Cervical MRI dated 09/27/22:  FINDINGS: Technical Note: Despite efforts by the technologist and patient, motion artifact is present on today's exam and could not be eliminated. This reduces exam sensitivity and specificity.   Alignment: Straightening of the cervical lordosis. Trace anterolisthesis at C7-T1.   Vertebrae:  Prior C3-4 and C6-7 ACDF. Additional interbody fusion at C4-5 where there is solid arthrodesis. No fracture. No evidence of discitis. No marrow replacing bone lesion.   Cord: Redemonstration of T2 hyperintense lesions within the cord at the C2 and C5 levels. Additional known lesion at the T1-T2 level is not well seen on today's motion degraded examination. The axial images do not extend caudally to that location. No new site of focal cervical cord signal abnormality.   Posterior Fossa, vertebral arteries, paraspinal tissues: Negative.   Disc levels:   C2-C3: Shallow central disc protrusion. No foraminal or canal stenosis. Unchanged.   C3-C4: Prior ACDF. No significant foraminal or canal stenosis. Unchanged.   C4-C5: Prior interbody fusion. No significant canal stenosis. Mild bilateral foraminal narrowing. Unchanged.   C5-C6: Endplate and uncovertebral spurring contribute to  mild-moderate bilateral foraminal stenosis. No significant canal stenosis. Unchanged.   C6-C7: Prior ACDF. No canal stenosis. Mild bilateral foraminal stenosis. Unchanged.   C7-T1: Left-sided facet arthropathy. No significant foraminal or canal stenosis. Unchanged.   IMPRESSION: 1. Redemonstration of T2 hyperintense lesions within the cord at the C2 and C5 levels consistent with known multiple sclerosis. Additional known lesion at the T1-T2 level is not well seen on today's motion degraded examination. No new site of focal cervical cord signal abnormality. 2. Mild-moderate bilateral foraminal stenosis at C5-6. Mild bilateral foraminal stenosis at C4-5 and C6-7. No canal stenosis at any level.     Electronically Signed   By: Duanne Guess D.O.   On: 10/13/2022 16:22    I have personally reviewed the images and agree with the above interpretation.  Above MRI reviewed with Dr. Myer Haff as well.    Assessment and Plan: Joshua [name] is a pleasant 52 y.o. male has history of ACDF C3-C4 on  02/20/20 by Dr. Myer Haff and C4-C7 cervical fusion by Dr. Gerlene Fee in 2014.   He has constant chronic neck pain with left arm pain into his arm pit and to the elbow.   Above MRI looks okay with some multilevel foraminal stenosis.   He has known severe ulnar compression and carpal tunnel syndrome bilaterally. He has pain and numbness, tingling, and weakness in both arms from the elbows into the hands.   He is also seeing ortho for right shoulder pain and MRI was ordered.   Treatment options discussed with patient and following plan made:   - Dr. Myer Haff does not recommend further surgery for his cervical spine.  - Will get records from [facility] including EMG. ROI signed. Once we have this, will have him see Dr. Katrinka Blazing to discuss possible ulnar compression/carpal tunnel release.  - Discussed referral to pain management. He declines.  - He will follow up with ortho regarding shoulder MRI. Message sent to [facility] to let him know it has not been scheduled yet.   I spent a total of 20 minutes in face-to-face and non-face-to-face activities related to this patient's care today including review of outside records, review of imaging, review of symptoms, physical exam, discussion of differential diagnosis, discussion of treatment options, and documentation.   ADDENDUM 11/05/22:  Records from [facility] scanned under media.   EMG 03/11/22 showed:  Severe ulnar neuropathy bilaterally, worse on right with cubital tunnel. Also with mild carpal tunnel bilaterally. No evidence of cervical radiculopathy.   Will schedule him an appointment with Dr. Katrinka Blazing.   Drake Leach PA-C Dept. of Neurosurgery

## 2022-10-26 ENCOUNTER — Ambulatory Visit (INDEPENDENT_AMBULATORY_CARE_PROVIDER_SITE_OTHER): Payer: 59 | Admitting: Orthopedic Surgery

## 2022-10-26 ENCOUNTER — Encounter: Payer: Self-pay | Admitting: Orthopedic Surgery

## 2022-10-26 VITALS — BP 126/76 | Ht 67.0 in | Wt 145.0 lb

## 2022-10-26 DIAGNOSIS — M4802 Spinal stenosis, cervical region: Secondary | ICD-10-CM

## 2022-10-26 DIAGNOSIS — M5412 Radiculopathy, cervical region: Secondary | ICD-10-CM

## 2022-10-26 DIAGNOSIS — M542 Cervicalgia: Secondary | ICD-10-CM

## 2022-10-26 DIAGNOSIS — G5623 Lesion of ulnar nerve, bilateral upper limbs: Secondary | ICD-10-CM

## 2022-10-26 DIAGNOSIS — Z981 Arthrodesis status: Secondary | ICD-10-CM

## 2022-10-26 DIAGNOSIS — G5603 Carpal tunnel syndrome, bilateral upper limbs: Secondary | ICD-10-CM

## 2022-11-02 ENCOUNTER — Ambulatory Visit: Payer: 59 | Admitting: Neurosurgery

## 2022-11-10 ENCOUNTER — Other Ambulatory Visit: Payer: Self-pay | Admitting: Orthopedic Surgery

## 2022-11-10 DIAGNOSIS — S46009A Unspecified injury of muscle(s) and tendon(s) of the rotator cuff of unspecified shoulder, initial encounter: Secondary | ICD-10-CM

## 2022-11-10 DIAGNOSIS — M25311 Other instability, right shoulder: Secondary | ICD-10-CM

## 2022-11-10 DIAGNOSIS — M25511 Pain in right shoulder: Secondary | ICD-10-CM

## 2022-11-12 NOTE — H&P (View-Only) (Signed)
Referring Physician:  No referring provider defined for this encounter.  Primary Physician:  Armando Gang, FNP  History of Present Illness: 11/15/2022 [name] is here today with a chief complaint of bilateral ulnar neuropathy.  He has been followed for cervical myelopathy and radiculopathy.  He comes in today for his findings of bilateral severe ulnar neuropathy.  He states that this left is worse than his right.  He gets pain in the elbows that radiates up into his medial arm and down his medial arm into his ulnar distribution bilaterally.  He has decree sensation.  He also has lost some of his dexterity.  Has been dropping things, has noted decreased ability to do things like to I laces and do buttons.  He feels like this is getting progressively worse.  He does improve with steroid administration.  He has been previously scheduled for ulnar decompression.  Notably he does have a history of multiple sclerosis and does have intermittent flares.  Review of Systems:  A 10 point review of systems is negative, except for the pertinent positives and negatives detailed in the HPI.  Past Medical History: Past Medical History:  Diagnosis Date   Allergy    Anxiety    Depression    Multiple sclerosis (HCC)    Neck pain    Neuromuscular disorder (HCC)     Past Surgical History: Past Surgical History:  Procedure Laterality Date   ANTERIOR CERVICAL DECOMP/DISCECTOMY FUSION N/A 02/20/2020   Procedure: ANTERIOR CERVICAL DECOMPRESSION/DISCECTOMY FUSION 1 LEVEL C3-4;  Surgeon: Venetia Night, MD;  Location: [facility] ORS;  Service: Neurosurgery;  Laterality: N/A;   COLONOSCOPY N/A 05/24/2022   Procedure: COLONOSCOPY;  Surgeon: Jaynie Collins, DO;  Location: [facility] ENDOSCOPY;  Service: Gastroenterology;  Laterality: N/A;   COLONOSCOPY N/A 06/10/2022   Procedure: COLONOSCOPY;  Surgeon: Jaynie Collins, DO;  Location: [facility] ENDOSCOPY;  Service: Gastroenterology;  Laterality:  N/A;   CRANIOPLASTY  2005   subdural hematoma  that had to be removed   NECK SURGERY  2014   SPINAL FUSION      Allergies: Allergies as of 11/15/2022 - Review Complete 10/26/2022  Allergen Reaction Noted   Penicillins Hives 02/12/2015    Medications:  Current Outpatient Medications:    HYDROcodone-acetaminophen (NORCO/VICODIN) 5-325 MG tablet, Take 1 tablet by mouth every 4 (four) hours as needed for moderate pain., Disp: 8 tablet, Rfl: 0   megestrol (MEGACE) 40 MG tablet, Take 40 mg by mouth daily., Disp: , Rfl:    meloxicam (MOBIC) 15 MG tablet, Take 1 tablet (15 mg total) by mouth daily., Disp: 30 tablet, Rfl: 0   methocarbamol (ROBAXIN) 500 MG tablet, Take 1 tablet (500 mg total) by mouth every 8 (eight) hours as needed for muscle spasms., Disp: 30 tablet, Rfl: 0   modafinil (PROVIGIL) 100 MG tablet, Take 1 tablet (100 mg total) by mouth every morning., Disp: 30 tablet, Rfl: 5   nortriptyline (PAMELOR) 50 MG capsule, Take 1 capsule (50 mg total) by mouth at bedtime., Disp: 60 capsule, Rfl: 5   tamsulosin (FLOMAX) 0.4 MG CAPS capsule, Take 0.4 mg by mouth daily., Disp: , Rfl:    varenicline (CHANTIX) 0.5 MG tablet, Take 0.5 mg by mouth 2 (two) times daily., Disp: , Rfl:   Social History: Social History   Tobacco Use   Smoking status: Former    Current packs/day: 0.00    Average packs/day: 1 pack/day for 20.0 years (20.0 ttl pk-yrs)    Types:  Cigarettes    Quit date: 08/2022    Years since quitting: 0.2   Smokeless tobacco: Never   Tobacco comments:    uses Chantix , also smoke marijuana  Vaping Use   Vaping status: Never Used  Substance Use Topics   Alcohol use: Yes    Comment: occasionally,none last 24   Drug use: Yes    Frequency: 7.0 times per week    Types: Marijuana    Family Medical History: Family History  Problem Relation Age of Onset   Hypertension Mother    Hypertension Father    Multiple sclerosis Sister    Aneurysm Brother     Physical  Examination: Vitals:   11/15/22 1112  BP: 110/72    General: Patient is in no apparent distress. Attention to examination is appropriate.  Neck:   Supple.  Full range of motion.  Respiratory: Patient is breathing without any difficulty.   NEUROLOGICAL:     Awake, alert, oriented to person, place, and time.  Speech is clear and fluent.   Cranial Nerves: Pupils equal round and reactive to light.  Facial tone is symmetric. Shoulder shrug is symmetric. Tongue protrusion is midline.  There is no pronator drift.  Motor Exam:  Motor examination demonstrates intrinsic hand wasting that is very mild, left worse than right.  On physical examination he does show some Wartenberg's sign on the left when compared to the right.  He has weakness in his ulnar lumbricals of approximately 4- out of 5, 4- out of 5 intrinsic finger function.  His grip is 5 - to 4+.  On the right side he is 4+ in the ulnar intrinsic musculature. He He has decreased sensation in his hands.  He has positive Tinel sign at the bilateral elbows.  Does not demonstrate snapping or subluxation of his ulnar nerves bilaterally on physical examination today.  Medical Decision Making  Imaging: Narrative & Impression  CLINICAL DATA:  Chronic neck pain.  History of multiple sclerosis   EXAM: MRI CERVICAL SPINE WITHOUT CONTRAST   TECHNIQUE: Multiplanar, multisequence MR imaging of the cervical spine was performed. No intravenous contrast was administered.   COMPARISON:  MRI 07/05/2021   FINDINGS: Technical Note: Despite efforts by the technologist and patient, motion artifact is present on today's exam and could not be eliminated. This reduces exam sensitivity and specificity.   Alignment: Straightening of the cervical lordosis. Trace anterolisthesis at C7-T1.   Vertebrae: Prior C3-4 and C6-7 ACDF. Additional interbody fusion at C4-5 where there is solid arthrodesis. No fracture. No evidence of discitis. No marrow  replacing bone lesion.   Cord: Redemonstration of T2 hyperintense lesions within the cord at the C2 and C5 levels. Additional known lesion at the T1-T2 level is not well seen on today's motion degraded examination. The axial images do not extend caudally to that location. No new site of focal cervical cord signal abnormality.   Posterior Fossa, vertebral arteries, paraspinal tissues: Negative.   Disc levels:   C2-C3: Shallow central disc protrusion. No foraminal or canal stenosis. Unchanged.   C3-C4: Prior ACDF. No significant foraminal or canal stenosis. Unchanged.   C4-C5: Prior interbody fusion. No significant canal stenosis. Mild bilateral foraminal narrowing. Unchanged.   C5-C6: Endplate and uncovertebral spurring contribute to mild-moderate bilateral foraminal stenosis. No significant canal stenosis. Unchanged.   C6-C7: Prior ACDF. No canal stenosis. Mild bilateral foraminal stenosis. Unchanged.   C7-T1: Left-sided facet arthropathy. No significant foraminal or canal stenosis. Unchanged.   IMPRESSION: 1. Redemonstration of  T2 hyperintense lesions within the cord at the C2 and C5 levels consistent with known multiple sclerosis. Additional known lesion at the T1-T2 level is not well seen on today's motion degraded examination. No new site of focal cervical cord signal abnormality. 2. Mild-moderate bilateral foraminal stenosis at C5-6. Mild bilateral foraminal stenosis at C4-5 and C6-7. No canal stenosis at any level.     Electronically Signed   By: Duanne Guess D.O.   On: 10/13/2022 16:22    Electrodiagnostics: Evaluation of his outside testing demonstrates severe ulnar neuropathy bilaterally at the elbows.  I have personally reviewed the images and electrodiagnostics and agree with the above interpretation.  Assessment and Plan: Mr. Vera is a pleasant 52 y.o. 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  He also has a history of multiple sclerosis.  On physical examination today he demonstrates intrinsic hand weakness especially in the ulnar distribution on the left worse than the right but also with weakness on the right.  He has slightly decreased sensation in the ulnar distribution but does have a history of cervical myeloradiculopathy which makes a focal examination of the peripheral nerve structure more difficult.  He has a severe Tinel sign bilaterally.  He states that he cannot keep his elbows bent for any prolonged period of time.  He often wakes with complete numbness in tingling in his ulnar distribution.  His EMG demonstrates severe cubital tunnel bilaterally.  Given his progressive symptoms we would like to offer a left-sided ulnar nerve decompression.  It is unclear how significant his carpal tunnel syndrome is at this point.  Would like to start with a ulnar nerve decompression to see how much relief he got, given the fact that the left side is worse than the right we will start here.  Should he have improvement in his ulnar distribution but continued to have median distribution issues we would then move forward with a median nerve decompression.  We also discussed the right sided decompression should he have a good result.  Will plan on booking him for a left-sided ulnar nerve decompression at the elbow.   Thank you for involving me in the care of this patient.    [name] MD/MSCR Neurosurgery - Peripheral Nerve Surgery

## 2022-11-12 NOTE — Progress Notes (Signed)
Referring Physician:  No referring provider defined for this encounter.  Primary Physician:  Joshua Gang, FNP  History of Present Illness: 11/15/2022 [name] is here today with a chief complaint of bilateral ulnar neuropathy.  He has been followed for cervical myelopathy and radiculopathy.  He comes in today for his findings of bilateral severe ulnar neuropathy.  He states that this left is worse than his right.  He gets pain in the elbows that radiates up into his medial arm and down his medial arm into his ulnar distribution bilaterally.  He has decree sensation.  He also has lost some of his dexterity.  Has been dropping things, has noted decreased ability to do things like to I laces and do buttons.  He feels like this is getting progressively worse.  He does improve with steroid administration.  He has been previously scheduled for ulnar decompression.  Notably he does have a history of multiple sclerosis and does have intermittent flares.  Review of Systems:  A 10 point review of systems is negative, except for the pertinent positives and negatives detailed in the HPI.  Past Medical History: Past Medical History:  Diagnosis Date   Allergy    Anxiety    Depression    Multiple sclerosis (HCC)    Neck pain    Neuromuscular disorder (HCC)     Past Surgical History: Past Surgical History:  Procedure Laterality Date   ANTERIOR CERVICAL DECOMP/DISCECTOMY FUSION N/A 02/20/2020   Procedure: ANTERIOR CERVICAL DECOMPRESSION/DISCECTOMY FUSION 1 LEVEL C3-4;  Surgeon: Venetia Night, MD;  Location: [facility] ORS;  Service: Neurosurgery;  Laterality: N/A;   COLONOSCOPY N/A 05/24/2022   Procedure: COLONOSCOPY;  Surgeon: Jaynie Collins, DO;  Location: [facility] ENDOSCOPY;  Service: Gastroenterology;  Laterality: N/A;   COLONOSCOPY N/A 06/10/2022   Procedure: COLONOSCOPY;  Surgeon: Jaynie Collins, DO;  Location: [facility] ENDOSCOPY;  Service: Gastroenterology;  Laterality:  N/A;   CRANIOPLASTY  2005   subdural hematoma  that had to be removed   NECK SURGERY  2014   SPINAL FUSION      Allergies: Allergies as of 11/15/2022 - Review Complete 10/26/2022  Allergen Reaction Noted   Penicillins Hives 02/12/2015    Medications:  Current Outpatient Medications:    HYDROcodone-acetaminophen (NORCO/VICODIN) 5-325 MG tablet, Take 1 tablet by mouth every 4 (four) hours as needed for moderate pain., Disp: 8 tablet, Rfl: 0   megestrol (MEGACE) 40 MG tablet, Take 40 mg by mouth daily., Disp: , Rfl:    meloxicam (MOBIC) 15 MG tablet, Take 1 tablet (15 mg total) by mouth daily., Disp: 30 tablet, Rfl: 0   methocarbamol (ROBAXIN) 500 MG tablet, Take 1 tablet (500 mg total) by mouth every 8 (eight) hours as needed for muscle spasms., Disp: 30 tablet, Rfl: 0   modafinil (PROVIGIL) 100 MG tablet, Take 1 tablet (100 mg total) by mouth every morning., Disp: 30 tablet, Rfl: 5   nortriptyline (PAMELOR) 50 MG capsule, Take 1 capsule (50 mg total) by mouth at bedtime., Disp: 60 capsule, Rfl: 5   tamsulosin (FLOMAX) 0.4 MG CAPS capsule, Take 0.4 mg by mouth daily., Disp: , Rfl:    varenicline (CHANTIX) 0.5 MG tablet, Take 0.5 mg by mouth 2 (two) times daily., Disp: , Rfl:   Social History: Social History   Tobacco Use   Smoking status: Former    Current packs/day: 0.00    Average packs/day: 1 pack/day for 20.0 years (20.0 ttl pk-yrs)    Types:  Cigarettes    Quit date: 08/2022    Years since quitting: 0.2   Smokeless tobacco: Never   Tobacco comments:    uses Chantix , also smoke marijuana  Vaping Use   Vaping status: Never Used  Substance Use Topics   Alcohol use: Yes    Comment: occasionally,none last 24   Drug use: Yes    Frequency: 7.0 times per week    Types: Marijuana    Family Medical History: Family History  Problem Relation Age of Onset   Hypertension Mother    Hypertension Father    Multiple sclerosis Sister    Aneurysm Brother     Physical  Examination: Vitals:   11/15/22 1112  BP: 110/72    General: Patient is in no apparent distress. Attention to examination is appropriate.  Neck:   Supple.  Full range of motion.  Respiratory: Patient is breathing without any difficulty.   NEUROLOGICAL:     Awake, alert, oriented to person, place, and time.  Speech is clear and fluent.   Cranial Nerves: Pupils equal round and reactive to light.  Facial tone is symmetric. Shoulder shrug is symmetric. Tongue protrusion is midline.  There is no pronator drift.  Motor Exam:  Motor examination demonstrates intrinsic hand wasting that is very mild, left worse than right.  On physical examination he does show some Wartenberg's sign on the left when compared to the right.  He has weakness in his ulnar lumbricals of approximately 4- out of 5, 4- out of 5 intrinsic finger function.  His grip is 5 - to 4+.  On the right side he is 4+ in the ulnar intrinsic musculature. He He has decreased sensation in his hands.  He has positive Tinel sign at the bilateral elbows.  Does not demonstrate snapping or subluxation of his ulnar nerves bilaterally on physical examination today.  Medical Decision Making  Imaging: Narrative & Impression  CLINICAL DATA:  Chronic neck pain.  History of multiple sclerosis   EXAM: MRI CERVICAL SPINE WITHOUT CONTRAST   TECHNIQUE: Multiplanar, multisequence MR imaging of the cervical spine was performed. No intravenous contrast was administered.   COMPARISON:  MRI 07/05/2021   FINDINGS: Technical Note: Despite efforts by the technologist and patient, motion artifact is present on today's exam and could not be eliminated. This reduces exam sensitivity and specificity.   Alignment: Straightening of the cervical lordosis. Trace anterolisthesis at C7-T1.   Vertebrae: Prior C3-4 and C6-7 ACDF. Additional interbody fusion at C4-5 where there is solid arthrodesis. No fracture. No evidence of discitis. No marrow  replacing bone lesion.   Cord: Redemonstration of T2 hyperintense lesions within the cord at the C2 and C5 levels. Additional known lesion at the T1-T2 level is not well seen on today's motion degraded examination. The axial images do not extend caudally to that location. No new site of focal cervical cord signal abnormality.   Posterior Fossa, vertebral arteries, paraspinal tissues: Negative.   Disc levels:   C2-C3: Shallow central disc protrusion. No foraminal or canal stenosis. Unchanged.   C3-C4: Prior ACDF. No significant foraminal or canal stenosis. Unchanged.   C4-C5: Prior interbody fusion. No significant canal stenosis. Mild bilateral foraminal narrowing. Unchanged.   C5-C6: Endplate and uncovertebral spurring contribute to mild-moderate bilateral foraminal stenosis. No significant canal stenosis. Unchanged.   C6-C7: Prior ACDF. No canal stenosis. Mild bilateral foraminal stenosis. Unchanged.   C7-T1: Left-sided facet arthropathy. No significant foraminal or canal stenosis. Unchanged.   IMPRESSION: 1. Redemonstration of  T2 hyperintense lesions within the cord at the C2 and C5 levels consistent with known multiple sclerosis. Additional known lesion at the T1-T2 level is not well seen on today's motion degraded examination. No new site of focal cervical cord signal abnormality. 2. Mild-moderate bilateral foraminal stenosis at C5-6. Mild bilateral foraminal stenosis at C4-5 and C6-7. No canal stenosis at any level.     Electronically Signed   By: Duanne Guess D.O.   On: 10/13/2022 16:22    Electrodiagnostics: Evaluation of his outside testing demonstrates severe ulnar neuropathy bilaterally at the elbows.  I have personally reviewed the images and electrodiagnostics and agree with the above interpretation.  Assessment and Plan: Mr. Vera is a pleasant 52 y.o. 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  He also has a history of multiple sclerosis.  On physical examination today he demonstrates intrinsic hand weakness especially in the ulnar distribution on the left worse than the right but also with weakness on the right.  He has slightly decreased sensation in the ulnar distribution but does have a history of cervical myeloradiculopathy which makes a focal examination of the peripheral nerve structure more difficult.  He has a severe Tinel sign bilaterally.  He states that he cannot keep his elbows bent for any prolonged period of time.  He often wakes with complete numbness in tingling in his ulnar distribution.  His EMG demonstrates severe cubital tunnel bilaterally.  Given his progressive symptoms we would like to offer a left-sided ulnar nerve decompression.  It is unclear how significant his carpal tunnel syndrome is at this point.  Would like to start with a ulnar nerve decompression to see how much relief he got, given the fact that the left side is worse than the right we will start here.  Should he have improvement in his ulnar distribution but continued to have median distribution issues we would then move forward with a median nerve decompression.  We also discussed the right sided decompression should he have a good result.  Will plan on booking him for a left-sided ulnar nerve decompression at the elbow.   Thank you for involving me in the care of this patient.    [name] MD/MSCR Neurosurgery - Peripheral Nerve Surgery

## 2022-11-15 ENCOUNTER — Encounter: Payer: Self-pay | Admitting: Neurosurgery

## 2022-11-15 ENCOUNTER — Ambulatory Visit (INDEPENDENT_AMBULATORY_CARE_PROVIDER_SITE_OTHER): Payer: 59 | Admitting: Neurosurgery

## 2022-11-15 VITALS — BP 110/72 | Ht 67.0 in | Wt 145.0 lb

## 2022-11-15 DIAGNOSIS — G5603 Carpal tunnel syndrome, bilateral upper limbs: Secondary | ICD-10-CM | POA: Diagnosis not present

## 2022-11-15 DIAGNOSIS — G5622 Lesion of ulnar nerve, left upper limb: Secondary | ICD-10-CM | POA: Insufficient documentation

## 2022-11-15 DIAGNOSIS — R29898 Other symptoms and signs involving the musculoskeletal system: Secondary | ICD-10-CM | POA: Diagnosis not present

## 2022-11-15 NOTE — Patient Instructions (Signed)
Please see below for information in regards to your upcoming surgery:   Planned surgery: left cubital tunnel decompression   Surgery date: 12/07/22 at [facility] (Medical Mall: [address], [place], [place] [address]) - you will find out your arrival time the business day before your surgery.   Pre-op appointment at [facility] Pre-admit Testing: we will call you with a date/time for this. If you are scheduled for an in person appointment, Pre-admit Testing is located on the first floor of the [facility] building, [address]. Please bring all prescriptions in the original prescription bottles to your appointment. During this appointment, they will advise you which medications you can take the morning of surgery, and which medications you will need to hold for surgery. Labs (such as blood work, EKG) may be done at your pre-op appointment. You are not required to fast for these labs. Should you need to change your pre-op appointment, please call Pre-admit testing at [phone].     How to contact us:  If you have any questions/concerns before or after surgery, you can reach [place] at [phone], or you can send a mychart message. We can be reached by phone or mychart 8am-4pm, Monday-Friday.  *Please note: Calls after 4pm are forwarded to a third party answering service. Mychart messages are not routinely monitored during evenings, weekends, and holidays. Please call our office to contact the answering service for urgent concerns during non-business hours.    If you have FMLA/disability paperwork, please drop it off or fax it to [phone], attention Patty.   Appointments/FMLA & disability paperwork: Joycelyn Rua, & Flonnie Hailstone Registered Nurse/Surgery scheduler: Royston Cowper Medical Assistants: Nash Mantis Physician Assistants: Manning Charity, PA-C & Drake Leach, PA-C Surgeons: Venetia Night, MD & Ernestine Mcmurray, MD

## 2022-11-17 ENCOUNTER — Other Ambulatory Visit: Payer: Self-pay

## 2022-11-17 DIAGNOSIS — Z01818 Encounter for other preprocedural examination: Secondary | ICD-10-CM

## 2022-11-22 ENCOUNTER — Encounter: Payer: Self-pay | Admitting: Oncology

## 2022-11-25 ENCOUNTER — Encounter
Admission: RE | Admit: 2022-11-25 | Discharge: 2022-11-25 | Disposition: A | Discharge status: Home / Self Care | Payer: 59 | Source: Ambulatory Visit | Attending: Neurosurgery | Admitting: Neurosurgery

## 2022-11-25 VITALS — BP 115/76 | HR 73 | Ht 67.0 in | Wt 161.6 lb

## 2022-11-25 DIAGNOSIS — Z01812 Encounter for preprocedural laboratory examination: Secondary | ICD-10-CM | POA: Diagnosis present

## 2022-11-25 DIAGNOSIS — Z01818 Encounter for other preprocedural examination: Secondary | ICD-10-CM | POA: Insufficient documentation

## 2022-11-25 DIAGNOSIS — I10 Essential (primary) hypertension: Secondary | ICD-10-CM | POA: Diagnosis not present

## 2022-11-25 DIAGNOSIS — Z01810 Encounter for preprocedural cardiovascular examination: Secondary | ICD-10-CM | POA: Diagnosis present

## 2022-11-25 DIAGNOSIS — E876 Hypokalemia: Secondary | ICD-10-CM

## 2022-11-25 DIAGNOSIS — D509 Iron deficiency anemia, unspecified: Secondary | ICD-10-CM | POA: Insufficient documentation

## 2022-11-25 HISTORY — DX: Traumatic subdural hemorrhage with loss of consciousness status unknown, initial encounter: S06.5XAA

## 2022-11-25 HISTORY — DX: Anemia, unspecified: D64.9

## 2022-11-25 HISTORY — DX: Cannabis use, unspecified, uncomplicated: F12.90

## 2022-11-25 HISTORY — DX: Lesion of ulnar nerve, unspecified upper limb: G56.20

## 2022-11-25 HISTORY — DX: Essential (primary) hypertension: I10

## 2022-11-25 HISTORY — DX: Cocaine abuse, uncomplicated: F14.10

## 2022-11-25 HISTORY — DX: Radiculopathy, cervical region: M54.12

## 2022-11-25 HISTORY — DX: Klippel-Feil syndrome: Q76.1

## 2022-11-25 LAB — BASIC METABOLIC PANEL
Anion gap: 10 (ref 5–15)
BUN: 15 mg/dL (ref 6–20)
CO2: 25 mmol/L (ref 22–32)
Calcium: 8.8 mg/dL — ABNORMAL LOW (ref 8.9–10.3)
Chloride: 102 mmol/L (ref 98–111)
Creatinine, Ser: 0.83 mg/dL (ref 0.61–1.24)
GFR, Estimated: >60 mL/min (ref >60)
Glucose, Bld: 92 mg/dL (ref 70–99)
Potassium: 3.1 mmol/L — ABNORMAL LOW (ref 3.5–5.1)
Sodium: 137 mmol/L (ref 135–145)

## 2022-11-25 LAB — CBC
HCT: 36.8 % — ABNORMAL LOW (ref 39.0–52.0)
Hemoglobin: 12.6 g/dL — ABNORMAL LOW (ref 13.0–17.0)
MCH: 30.5 pg (ref 26.0–34.0)
MCHC: 34.2 g/dL (ref 30.0–36.0)
MCV: 89.1 fL (ref 80.0–100.0)
Platelets: 342 10*3/uL (ref 150–400)
RBC: 4.13 MIL/uL — ABNORMAL LOW (ref 4.22–5.81)
RDW: 12.7 % (ref 11.5–15.5)
WBC: 11.2 10*3/uL — ABNORMAL HIGH (ref 4.0–10.5)
nRBC: 0 % (ref 0.0–0.2)

## 2022-11-25 MED ORDER — POTASSIUM CHLORIDE CRYS ER 20 MEQ PO TBCR: 20.0000 meq | EXTENDED_RELEASE_TABLET | Freq: Every day | ORAL | 0 refills | Status: DC

## 2022-11-25 NOTE — Patient Instructions (Addendum)
Your procedure is scheduled on:12-07-22 Tuesday Report to the Registration Desk on the 1st floor of the [facility] Mall.Then proceed to the 2nd floor Surgery Desk To find out your arrival time, please call [phone] between 1PM - 3PM on:12-06-22 Monday If your arrival time is 6:00 am, do not arrive before that time as the [facility] entrance doors do not open until 6:00 am.  REMEMBER: Instructions that are not followed completely may result in serious medical risk, up to and including death; or upon the discretion of your surgeon and anesthesiologist your surgery may need to be rescheduled.  Do not eat food after midnight the night before surgery.  No gum chewing or hard candies.  You may however, drink CLEAR liquids up to 2 hours before you are scheduled to arrive for your surgery. Do not drink anything within 2 hours of your scheduled arrival time.  Clear liquids include: - water  - apple juice without pulp - gatorade (not RED colors) - black coffee or tea (Do NOT add milk or creamers to the coffee or tea) Do NOT drink anything that is not on this list.  One week prior to surgery: Stop ANY OVER THE COUNTER supplements until after surgery.  Stop your sildenafil (VIAGRA) 2 days prior to surgery-Last dose will be on 12-04-22 Saturday  Continue taking all of your other prescription medications up until the day of surgery.  ON THE DAY OF SURGERY ONLY TAKE THESE MEDICATIONS WITH SIPS OF WATER: -tamsulosin (FLOMAX)  -tiZANidine (ZANAFLEX)   No Alcohol for 24 hours before or after surgery.  No Smoking including e-cigarettes for 24 hours before surgery.  No chewable tobacco products for at least 6 hours before surgery.  No nicotine patches on the day of surgery.  Do not use any "recreational" drugs for at least a week (preferably 2 weeks) before your surgery.  Please be advised that the combination of cocaine and anesthesia may have negative outcomes, up to and including death. If  you test positive for cocaine, your surgery will be cancelled.  On the morning of surgery brush your teeth with toothpaste and water, you may rinse your mouth with mouthwash if you wish. Do not swallow any toothpaste or mouthwash.  Use CHG Soap as directed on instruction sheet.  Do not wear jewelry, make-up, hairpins, clips or nail polish.  For welded (permanent) jewelry: bracelets, anklets, waist bands, etc.  Please have this removed prior to surgery.  If it is not removed, there is a chance that hospital personnel will need to cut it off on the day of surgery.  Do not wear lotions, powders, or perfumes.   Do not shave body hair from the neck down 48 hours before surgery.  Contact lenses, hearing aids and dentures may not be worn into surgery.  Do not bring valuables to the hospital. [facility] is not responsible for any missing/lost belongings or valuables.   Notify your doctor if there is any change in your medical condition (cold, fever, infection).  Wear comfortable clothing (specific to your surgery type) to the hospital.  After surgery, you can help prevent lung complications by doing breathing exercises.  Take deep breaths and cough every 1-2 hours. Your doctor may order a device called an Incentive Spirometer to help you take deep breaths. When coughing or sneezing, hold a pillow firmly against your incision with both hands. This is called "splinting." Doing this helps protect your incision. It also decreases belly discomfort.  If you are being  admitted to the hospital overnight, leave your suitcase in the car. After surgery it may be brought to your room.  In case of increased patient census, it may be necessary for you, the patient, to continue your postoperative care in the Same Day Surgery department.  If you are being discharged the day of surgery, you will not be allowed to drive home. You will need a responsible individual to drive you home and stay with you for 24  hours after surgery.   If you are taking public transportation, you will need to have a responsible individual with you.  Please call the Pre-admissions Testing Dept. at [phone] if you have any questions about these instructions.  Surgery Visitation Policy:  Patients having surgery or a procedure may have two visitors.  Children under the age of 62 must have an adult with them who is not the patient.     Preparing for Surgery with CHLORHEXIDINE GLUCONATE (CHG) Soap  Chlorhexidine Gluconate (CHG) Soap  o An antiseptic cleaner that kills germs and bonds with the skin to continue killing germs even after washing  o Used for showering the night before surgery and morning of surgery  Before surgery, you can play an important role by reducing the number of germs on your skin.  CHG (Chlorhexidine gluconate) soap is an antiseptic cleanser which kills germs and bonds with the skin to continue killing germs even after washing.  Please do not use if you have an allergy to CHG or antibacterial soaps. If your skin becomes reddened/irritated stop using the CHG.  1. Shower the NIGHT BEFORE SURGERY and the MORNING OF SURGERY with CHG soap.  2. If you choose to wash your hair, wash your hair first as usual with your normal shampoo.  3. After shampooing, rinse your hair and body thoroughly to remove the shampoo.  4. Use CHG as you would any other liquid soap. You can apply CHG directly to the skin and wash gently with a scrungie or a clean washcloth.  5. Apply the CHG soap to your body only from the neck down. Do not use on open wounds or open sores. Avoid contact with your eyes, ears, mouth, and genitals (private parts). Wash face and genitals (private parts) with your normal soap.  6. Wash thoroughly, paying special attention to the area where your surgery will be performed.  7. Thoroughly rinse your body with warm water.  8. Do not shower/wash with your normal soap after using and  rinsing off the CHG soap.  9. Pat yourself dry with a clean towel.  10. Wear clean pajamas to bed the night before surgery.  12. Place clean sheets on your bed the night of your first shower and do not sleep with pets.  13. Shower again with the CHG soap on the day of surgery prior to arriving at the hospital.  14. Do not apply any deodorants/lotions/powders.  15. Please wear clean clothes to the hospital.

## 2022-11-25 NOTE — Pre-Procedure Instructions (Signed)
Pt was not instructed to take his Prednisone for his MS the morning of surgery as this is taken every other day and his surgery day will be the day off from Prednisone

## 2022-11-25 NOTE — Progress Notes (Signed)
[facility] Perioperative Services: Pre-Admission/Anesthesia Testing  Abnormal Lab Notification and Treatment Plan of Care   Date: 11/25/22  Name: Joshua [name] MRN:   [id number]  Re: Abnormal labs noted during PAT appointment   Notified:  Provider Name Provider Role Notification Mode  Ernestine Mcmurray, MD Neurosurgery (Surgeon) Routed and/or faxed via Wille Glaser, FNP Primary Care Provider Routed and/or faxed via [facility]   Clinical Information and Notes:  ABNORMAL LAB VALUE(S): Lab Results  Component Value Date   K 3.1 (L) 11/25/2022   Joshua [name] is scheduled for an elective LEFT CUBITAL TUNNEL DECOMPRESSION (Left) on 12/07/2022. In review of his medication reconciliation, it is noted that the patient is not taking prescribed diuretic medications. Review of his medications reveals that patient is on megestrol, therefore I would assume that this is nutritional. With that said, verenicline could also be indicated, as hypokalemia is one of the more rare side effects.   Please note, in efforts to promote a safe and effective anesthetic course, per current guidelines/standards set by the [facility] anesthesia team, the minimal acceptable K+ level for the patient to proceed with general anesthesia is 3.0 mmol/L. With that being said, if the patient drops any lower, his elective procedure will need to be postponed until K+ is better optimized. In efforts to prevent case cancellation, will make efforts to optimize pre-surgical K+ level so that patient can safely undergo the planned surgical intervention.   Impression and Plan:  Joshua [name] found to be HYPOkalemic at 3.1 mmol/L on preoperative labs.   Called patient to discuss results and plans for correction of noted electrolyte derangement. No diuretics or current K+ supplements. Patient denies regular use of laxative medications. Discussed that in the absence of GI related symptoms (no diarrhea), this is  likely a nutritionally mediated derangement. Reviewed plans for preoperative optimization as follows:   Meds ordered this encounter  Medications   potassium chloride SA (KLOR-CON M) 20 MEQ tablet    Sig: Take 1 tablet (20 mEq total) by mouth daily for 7 days.    Dispense:  7 tablet    Refill:  0    Call patient when ready for pick up. Rx for preoperative K+ optimization.   Encouraged patient to follow up with PCP about 2-3 weeks postoperatively to have labs rechecked to ensure that levels are remaining within normal range. Discussed nutritional intake of K+ rich foods as an adjunctive way to keep his K+ levels normal; list of K+ rich foods provided. Also mentioned ORS, however advised him not to rely solely on these drinks, as they are high in Na+, and he has a HTN diagnosis.   Will send copy of this note to surgeon and PCP to make them aware of K+ level and plans for correction. Discussed that PCP may elect to add a daily K+ supplement if levels remain low on recheck. Order entered to recheck K+ on the day of his surgery to ensure optimization. Wished patient the best of luck with his upcoming surgery and subsequent recovery. He was encouraged to return call to the PAT clinic, or to his surgeon's office, should any questions or concerns arise between now and the time of his surgery. Patient was appreciative of the care/concern expressed by PAT staff.   Encounter Diagnoses  Name Primary?   Pre-operative laboratory examination Yes   Hypokalemia    Quentin Mulling, MSN, APRN, FNP-C, CEN [facility]  Perioperative Services Nurse Practitioner  Phone: [phone] 11/25/22 1:34 PM  NOTE: This note has been prepared using Scientist, clinical (histocompatibility and immunogenetics). Despite my best ability to proofread, there is always the potential that unintentional transcriptional errors may still occur from this process.

## 2022-11-26 ENCOUNTER — Ambulatory Visit
Admission: RE | Admit: 2022-11-26 | Discharge: 2022-11-26 | Disposition: A | Discharge status: Home / Self Care | Payer: 59 | Source: Ambulatory Visit | Attending: Orthopedic Surgery | Admitting: Orthopedic Surgery

## 2022-11-26 DIAGNOSIS — S46009A Unspecified injury of muscle(s) and tendon(s) of the rotator cuff of unspecified shoulder, initial encounter: Secondary | ICD-10-CM

## 2022-11-26 DIAGNOSIS — M25511 Pain in right shoulder: Secondary | ICD-10-CM

## 2022-11-26 DIAGNOSIS — M25311 Other instability, right shoulder: Secondary | ICD-10-CM

## 2022-12-06 MED ORDER — ORAL CARE MOUTH RINSE: 15.0000 mL | Freq: Once | OROMUCOSAL | Status: DC

## 2022-12-06 MED ORDER — LACTATED RINGERS IV SOLN: INTRAVENOUS | Status: DC

## 2022-12-06 MED ORDER — CEFAZOLIN SODIUM-DEXTROSE 2-4 GM/100ML-% IV SOLN
2.0000 g | INTRAVENOUS | Status: AC
  Administered 2022-12-07: 2 g via INTRAVENOUS

## 2022-12-06 MED ORDER — CEFAZOLIN IN SODIUM CHLORIDE 2-0.9 GM/100ML-% IV SOLN
2.0000 g | Freq: Once | INTRAVENOUS | Status: DC
  Filled 2022-12-06: qty 100

## 2022-12-06 MED ORDER — CHLORHEXIDINE GLUCONATE 0.12 % MT SOLN: 15.0000 mL | Freq: Once | OROMUCOSAL | Status: DC

## 2022-12-07 ENCOUNTER — Other Ambulatory Visit: Payer: Self-pay | Admitting: Neurosurgery

## 2022-12-07 ENCOUNTER — Other Ambulatory Visit: Payer: Self-pay

## 2022-12-07 ENCOUNTER — Encounter: Payer: Self-pay | Admitting: Neurosurgery

## 2022-12-07 ENCOUNTER — Ambulatory Visit
Admission: RE | Admit: 2022-12-07 | Discharge: 2022-12-07 | Disposition: A | Discharge status: Home / Self Care | Payer: 59 | Source: Ambulatory Visit | Attending: Neurosurgery | Admitting: Neurosurgery

## 2022-12-07 ENCOUNTER — Ambulatory Visit: Payer: 59 | Admitting: Urgent Care

## 2022-12-07 ENCOUNTER — Encounter
Admission: RE | Disposition: A | Discharge status: Home / Self Care | Payer: Self-pay | Source: Ambulatory Visit | Attending: Neurosurgery

## 2022-12-07 ENCOUNTER — Ambulatory Visit: Payer: 59 | Admitting: Certified Registered"

## 2022-12-07 DIAGNOSIS — G35 Multiple sclerosis: Secondary | ICD-10-CM | POA: Insufficient documentation

## 2022-12-07 DIAGNOSIS — Z01818 Encounter for other preprocedural examination: Secondary | ICD-10-CM

## 2022-12-07 DIAGNOSIS — G5623 Lesion of ulnar nerve, bilateral upper limbs: Secondary | ICD-10-CM | POA: Diagnosis present

## 2022-12-07 DIAGNOSIS — R29898 Other symptoms and signs involving the musculoskeletal system: Secondary | ICD-10-CM | POA: Diagnosis present

## 2022-12-07 DIAGNOSIS — I10 Essential (primary) hypertension: Secondary | ICD-10-CM | POA: Diagnosis not present

## 2022-12-07 DIAGNOSIS — F32A Depression, unspecified: Secondary | ICD-10-CM | POA: Diagnosis not present

## 2022-12-07 DIAGNOSIS — F419 Anxiety disorder, unspecified: Secondary | ICD-10-CM | POA: Insufficient documentation

## 2022-12-07 DIAGNOSIS — Z87891 Personal history of nicotine dependence: Secondary | ICD-10-CM | POA: Insufficient documentation

## 2022-12-07 DIAGNOSIS — R531 Weakness: Secondary | ICD-10-CM | POA: Diagnosis not present

## 2022-12-07 DIAGNOSIS — G5622 Lesion of ulnar nerve, left upper limb: Secondary | ICD-10-CM | POA: Diagnosis present

## 2022-12-07 HISTORY — PX: OTHER SURGICAL HISTORY: SHX169

## 2022-12-07 SURGERY — ULNAR NERVE DECOMPRESSION
Anesthesia: General | Site: Elbow | Laterality: Left

## 2022-12-07 MED ORDER — 0.9 % SODIUM CHLORIDE (POUR BTL) OPTIME
TOPICAL | Status: DC | PRN
  Administered 2022-12-07: 500 mL

## 2022-12-07 MED ORDER — LIDOCAINE HCL (CARDIAC) PF 100 MG/5ML IV SOSY
PREFILLED_SYRINGE | INTRAVENOUS | Status: DC | PRN
  Administered 2022-12-07: 100 mg via INTRAVENOUS

## 2022-12-07 MED ORDER — METHYLPREDNISOLONE ACETATE 40 MG/ML IJ SUSP
INTRAMUSCULAR | Status: AC
Start: 2022-12-07 — End: ?
  Filled 2022-12-07: qty 1

## 2022-12-07 MED ORDER — KETOROLAC TROMETHAMINE 30 MG/ML IJ SOLN
INTRAMUSCULAR | Status: AC
  Filled 2022-12-07: qty 1

## 2022-12-07 MED ORDER — KETOROLAC TROMETHAMINE 30 MG/ML IJ SOLN
INTRAMUSCULAR | Status: DC | PRN
  Administered 2022-12-07: 30 mg via INTRAVENOUS

## 2022-12-07 MED ORDER — CEFAZOLIN SODIUM-DEXTROSE 2-4 GM/100ML-% IV SOLN
INTRAVENOUS | Status: AC
  Filled 2022-12-07: qty 100

## 2022-12-07 MED ORDER — BUPIVACAINE-EPINEPHRINE 0.5% -1:200000 IJ SOLN
INTRAMUSCULAR | Status: DC | PRN
  Administered 2022-12-07: 8 mL

## 2022-12-07 MED ORDER — OXYCODONE HCL 5 MG PO TABS
ORAL_TABLET | ORAL | Status: AC
  Filled 2022-12-07: qty 1

## 2022-12-07 MED ORDER — CHLORHEXIDINE GLUCONATE 0.12 % MT SOLN
OROMUCOSAL | Status: AC
  Filled 2022-12-07: qty 15

## 2022-12-07 MED ORDER — HYDROCODONE-ACETAMINOPHEN 5-325 MG PO TABS: 1.0000 | ORAL_TABLET | ORAL | 0 refills | Status: AC | PRN

## 2022-12-07 MED ORDER — FENTANYL CITRATE (PF) 100 MCG/2ML IJ SOLN: 25.0000 ug | INTRAMUSCULAR | Status: DC | PRN

## 2022-12-07 MED ORDER — HYDROCODONE-ACETAMINOPHEN 5-325 MG PO TABS: 1.0000 | ORAL_TABLET | ORAL | Status: DC | PRN

## 2022-12-07 MED ORDER — PROPOFOL 500 MG/50ML IV EMUL
INTRAVENOUS | Status: DC | PRN
  Administered 2022-12-07: 30 mg via INTRAVENOUS
  Administered 2022-12-07: 160 ug/kg/min via INTRAVENOUS

## 2022-12-07 MED ORDER — ACETAMINOPHEN 10 MG/ML IV SOLN
INTRAVENOUS | Status: DC | PRN
  Administered 2022-12-07: 1000 mg via INTRAVENOUS

## 2022-12-07 MED ORDER — METHYLPREDNISOLONE ACETATE 40 MG/ML IJ SUSP
INTRAMUSCULAR | Status: DC | PRN
  Administered 2022-12-07: 40 mg

## 2022-12-07 MED ORDER — HYDROCODONE-ACETAMINOPHEN 5-325 MG PO TABS
ORAL_TABLET | ORAL | Status: AC
  Filled 2022-12-07: qty 1

## 2022-12-07 MED ORDER — DEXAMETHASONE SODIUM PHOSPHATE 10 MG/ML IJ SOLN
INTRAMUSCULAR | Status: DC | PRN
  Administered 2022-12-07: 10 mg via INTRAVENOUS

## 2022-12-07 MED ORDER — BUPIVACAINE-EPINEPHRINE (PF) 0.5% -1:200000 IJ SOLN
INTRAMUSCULAR | Status: AC
  Filled 2022-12-07: qty 10

## 2022-12-07 MED ORDER — DEXAMETHASONE SODIUM PHOSPHATE 10 MG/ML IJ SOLN
INTRAMUSCULAR | Status: AC
  Filled 2022-12-07: qty 1

## 2022-12-07 MED ORDER — PROPOFOL 10 MG/ML IV BOLUS
INTRAVENOUS | Status: AC
Start: 2022-12-07 — End: ?
  Filled 2022-12-07: qty 20

## 2022-12-07 MED ORDER — LIDOCAINE HCL (PF) 2 % IJ SOLN
INTRAMUSCULAR | Status: AC
  Filled 2022-12-07: qty 5

## 2022-12-07 MED ORDER — PROPOFOL 10 MG/ML IV BOLUS
INTRAVENOUS | Status: AC
  Filled 2022-12-07: qty 20

## 2022-12-07 MED ORDER — FENTANYL CITRATE (PF) 100 MCG/2ML IJ SOLN
INTRAMUSCULAR | Status: DC | PRN
  Administered 2022-12-07: 50 ug via INTRAVENOUS
  Administered 2022-12-07 (×2): 25 ug via INTRAVENOUS

## 2022-12-07 MED ORDER — FENTANYL CITRATE (PF) 100 MCG/2ML IJ SOLN
INTRAMUSCULAR | Status: AC
  Filled 2022-12-07: qty 2

## 2022-12-07 MED ORDER — MIDAZOLAM HCL 2 MG/2ML IJ SOLN
INTRAMUSCULAR | Status: DC | PRN
  Administered 2022-12-07: 2 mg via INTRAVENOUS

## 2022-12-07 MED ORDER — MIDAZOLAM HCL 2 MG/2ML IJ SOLN
INTRAMUSCULAR | Status: AC
  Filled 2022-12-07: qty 2

## 2022-12-07 MED ORDER — ACETAMINOPHEN 10 MG/ML IV SOLN
INTRAVENOUS | Status: AC
  Filled 2022-12-07: qty 100

## 2022-12-07 MED ORDER — OXYCODONE HCL 5 MG PO TABS
5.0000 mg | ORAL_TABLET | Freq: Once | ORAL | Status: AC
  Administered 2022-12-07: 5 mg via ORAL

## 2022-12-07 MED ORDER — DROPERIDOL 2.5 MG/ML IJ SOLN: 0.6250 mg | Freq: Once | INTRAMUSCULAR | Status: DC | PRN

## 2022-12-07 SURGICAL SUPPLY — 38 items
ADH SKN CLS APL DERMABOND .7 (GAUZE/BANDAGES/DRESSINGS) ×1
AGENT HMST KT MTR STRL THRMB (HEMOSTASIS)
APL PRP STRL LF DISP 70% ISPRP (MISCELLANEOUS) ×1
BNDG GAUZE DERMACEA FLUFF 4 (GAUZE/BANDAGES/DRESSINGS) ×1 IMPLANT
BNDG GZE DERMACEA 4 6PLY (GAUZE/BANDAGES/DRESSINGS) ×1
BRUSH SCRUB EZ 4% CHG (MISCELLANEOUS) ×1 IMPLANT
CHLORAPREP W/TINT 26 (MISCELLANEOUS) ×1 IMPLANT
DERMABOND ADVANCED .7 DNX12 (GAUZE/BANDAGES/DRESSINGS) ×1 IMPLANT
DRAPE SURG 17X11 SM STRL (DRAPES) ×2 IMPLANT
FORCEPS JEWEL BIP 4-3/4 STR (INSTRUMENTS) ×1 IMPLANT
GAUZE SPONGE 4X4 12PLY STRL (GAUZE/BANDAGES/DRESSINGS) ×1 IMPLANT
GLOVE BIOGEL PI IND STRL 8 (GLOVE) ×1 IMPLANT
GLOVE SURG SYN 7.5 E (GLOVE) ×1 IMPLANT
GLOVE SURG SYN 7.5 PF PI (GLOVE) ×1 IMPLANT
GOWN STRL REUS W/ TWL LRG LVL3 (GOWN DISPOSABLE) ×1 IMPLANT
GOWN STRL REUS W/ TWL XL LVL3 (GOWN DISPOSABLE) ×1 IMPLANT
GOWN STRL REUS W/TWL LRG LVL3 (GOWN DISPOSABLE) ×1
GOWN STRL REUS W/TWL XL LVL3 (GOWN DISPOSABLE) ×1
KIT TURNOVER KIT A (KITS) ×1 IMPLANT
MANIFOLD NEPTUNE II (INSTRUMENTS) ×1 IMPLANT
NDL HYPO 25X1 1.5 SAFETY (NEEDLE) ×1 IMPLANT
NEEDLE HYPO 25X1 1.5 SAFETY (NEEDLE) ×1 IMPLANT
NS IRRIG 500ML POUR BTL (IV SOLUTION) ×1 IMPLANT
PACK BASIN MINOR ARMC (MISCELLANEOUS) ×1 IMPLANT
PACK EXTREMITY ARMC (MISCELLANEOUS) ×1 IMPLANT
PROBE NEUROSIGN BIPOL (MISCELLANEOUS) IMPLANT
PROBE NEUROSIGN BIPOLAR (MISCELLANEOUS)
SPONGE KITTNER 5P (MISCELLANEOUS) ×1 IMPLANT
SURGIFLO W/THROMBIN 8M KIT (HEMOSTASIS) IMPLANT
SUT DVC VLOC 90 3-0 CV23 UNDY (SUTURE) IMPLANT
SUT ETHILON 6 0 9-3 1X18 BLK (SUTURE) IMPLANT
SUT MNCRL AB 4-0 PS2 18 (SUTURE) IMPLANT
SUT VIC AB 2-0 SH 27 (SUTURE)
SUT VIC AB 2-0 SH 27XBRD (SUTURE) IMPLANT
SUT VIC AB 3-0 SH 27 (SUTURE) ×1
SUT VIC AB 3-0 SH 27X BRD (SUTURE) IMPLANT
TRAP FLUID SMOKE EVACUATOR (MISCELLANEOUS) ×1 IMPLANT
WATER STERILE IRR 500ML POUR (IV SOLUTION) ×1 IMPLANT

## 2022-12-07 NOTE — Op Note (Signed)
Indications: Joshua [name] Is a 52 y.o. male  with history of progressive left-sided ulnar neuropathy and hand weakness refractory to conservative management.  Findings:   Preoperative Diagnosis:  Cubital Tunnel Syndrome, left Hand weakness, Left  EBL: Minimal IVF: See anesthesia report Drains: none Disposition:Stable to PACU Complications: none  No foley catheter was placed.   Preoperative Note: Joshua [name] is a 52 y.o. male with a history of progressive left ulnar neuropathy and hand weakness refractory to conservative management.  They had tried rest, padding, and watchful waiting but had continued progressive symptoms.  Given the progression of her ulnar neuropathy plan was made for ulnar nerve decompression without transposition.  Risk of surgery is discussed and include: Infection, bleeding, wound healing issues, nerve injury, pain, failure to relieve the symptoms, need for further surgery.  Procedure:  1) Left ulnar nerve decompression at the elbow   Procedure: After obtaining informed consent, the patient taken to the operating room, placed in supine position, monitored anesthesia care was induced.  They were given preoperative antibiotics.  Prepped and draped in the usual fashion.  Comprehensive timeout was performed verifying the patient's name, MRN, planned procedure.  The humerus was padded, elbow was externally rotated, a curvilinear incision over the medial aspect of the elbow was planned.  This went proximal to the medial epicondyle as well as distal to cover the space from the cubital tunnel to the intermuscular septum/arcade of Struthers.  Local anesthetic was injected, skin was opened sharply, the skin was dissected down to the subcutaneous fascia.  Care was taken not to sacrifice any cutaneous nerves while doing this dissection.  We are able to feel and palpate the intermuscular septum.  This was where we are able to identify the nerve initially.  The nerve  was underneath the septum and was being compressed by it and the arcade of Struthers.  These was divided sharply resulting in significant proximal decompression  We then continued to follow the nerve distally decompressing on the way.  We found it in the retrocondylar groove, we removed all the soft tissue overlying.  We then continue to follow it distally.  We are able to identify the fascia overlying the heads of the FCU.  This was divided sharply.  We are then able to split the heads of the FCU and identify Osborne's band.  This was severely compressive.  We then divided this.  At this point the nerve was well decompressed from proximal to the arcade of Struthers to distal to Osborne's band.  There were no areas of ongoing compression.  The nerve did not dislocate.  When the elbow was bent there were no evidence of any ongoing compression.  The wound was copiously irrigated.  We then placed depomedrol on the nerve.The wound was then closed in multiple layers.  Sterile dressing was applied.  No immediate complications.  Sponge and pattie counts were correct at the end of the procedure.   I performed this surgery without an Designer, television/film set.   [name], MD/MSCR

## 2022-12-07 NOTE — Interval H&P Note (Signed)
History and Physical Interval Note:  12/07/2022 7:07 AM  Joshua [name]  has presented today for surgery, with the diagnosis of G56.22 Cubital tunnel syndrome on left  R29.898 Left hand weakness.  The various methods of treatment have been discussed with the patient and family. After consideration of risks, benefits and other options for treatment, the patient has consented to  Procedure(s): LEFT CUBITAL TUNNEL DECOMPRESSION (Left) as a surgical intervention.  The patient's history has been reviewed, patient examined, no change in status, stable for surgery.  I have reviewed the patient's chart and labs.  Questions were answered to the patient's satisfaction.    Heart and Lungs clear on auscultation   [name]

## 2022-12-07 NOTE — Progress Notes (Signed)
Pt called requesting refill of Norco for post-op pain. Sent in

## 2022-12-07 NOTE — Discharge Instructions (Addendum)
Your surgeon has performed an operation on your nerve or nerves.Many times, patients feel better immediately after surgery and can "overdo it." Even if you feel well, it is important that you follow these activity guidelines.  We do not however expect immediate improvement.  Will continue to follow you closely in clinic.  * It is ok to take NSAIDs after surgery.  Activity    Avoid lifting objects heavier than 10 pounds (gallon milk jug).  Where possible, avoid household activities that involve lifting, bending, pushing, or pulling such as laundry, vacuuming, grocery shopping, and childcare. Try to arrange for help from friends and family for these activities while your nerve  Increase physical activity slowly as tolerated.  Taking short walks is encouraged, but avoid strenuous exercise. Do not jog, run, bicycle, lift weights, or participate in any other exercises unless specifically allowed by your doctor. Avoid prolonged sitting, including car rides.  You should not drive until off of any narcotic pain medications  Until released by your doctor, you should not return to work or school.  You should rest at home and let your body heal.   You may shower three days after your surgery.  After showering, lightly dab your incision dry. Do not take a tub bath or go swimming for 3 weeks, or until approved by your doctor at your follow-up appointment.  If you smoke, we strongly recommend that you quit.  Smoking has been proven to interfere with normal healing in your back and will dramatically reduce the success rate of your surgery. Please contact [facility] (800-QUIT-NOW) and use the resources at www.[facility].com for assistance in stopping smoking.  Surgical Incision   If you have a dressing on your incision, you may remove it three days after your surgery. Keep your incision area clean and dry.  If you have staples or stitches on your incision, you should have a follow up scheduled for removal.  If you do not have staples or stitches, you will have steri-strips (small pieces of surgical tape) or Dermabond glue. The steri-strips/glue should begin to peel away within about a week (it is fine if the steri-strips fall off before then). If the strips are still in place one week after your surgery, you may gently remove them.  Diet            You may return to your usual diet. Be sure to stay hydrated.  When to Contact us  Although your surgery and recovery will likely be uneventful, you may have some residual numbness, aches, and pains in your arms or legs. This is normal and should improve in the next few weeks.  However, should you experience any of the following, contact us immediately: New numbness or weakness Pain that is progressively getting worse, and is not relieved by your pain medications or rest Bleeding, redness, swelling, pain, or drainage from surgical incision Chills or flu-like symptoms Fever greater than 101.0 F (38.3 C) Problems with bowel or bladder functions Difficulty breathing or shortness of breath Warmth, tenderness, or swelling in your calf  Contact Information How to contact us:  If you have any questions/concerns before or after surgery, you can reach [place] at [phone], or you can send a mychart message. We can be reached by phone or mychart 8am-4pm, Monday-Friday.  *Please note: Calls after 4pm are forwarded to a third party answering service. Mychart messages are not routinely monitored during evenings, weekends, and holidays. Please call our office to contact the answering service for urgent  concerns during non-business hours.

## 2022-12-07 NOTE — Transfer of Care (Signed)
Immediate Anesthesia Transfer of Care Note  Patient: Joshua [name]  Procedure(s) Performed: LEFT CUBITAL TUNNEL DECOMPRESSION (Left: Elbow)  Patient Location: PACU  Anesthesia Type:General  Level of Consciousness: awake, alert , and sedated  Airway & Oxygen Therapy: Patient Spontanous Breathing  Post-op Assessment: Report given to RN and Post -op Vital signs reviewed and stable  Post vital signs: Reviewed and stable  Last Vitals:  Vitals Value Taken Time  BP 122/81 12/07/22 0820  Temp 36.1 0820  Pulse 80 12/07/22 0823  Resp 12 12/07/22 0823  SpO2 99 % 12/07/22 0823  Vitals shown include unfiled device data.  Last Pain:  Vitals:   12/07/22 7829  TempSrc: Temporal  PainSc: 10-Worst pain ever         Complications: No notable events documented.

## 2022-12-07 NOTE — Anesthesia Preprocedure Evaluation (Addendum)
Anesthesia Evaluation  Patient identified by MRN, date of birth, ID band Patient awake    Reviewed: Allergy & Precautions, H&P , NPO status , Patient's Chart, lab work & pertinent test results  History of Anesthesia Complications Negative for: history of anesthetic complications  Airway Mallampati: II  TM Distance: >3 FB Neck ROM: full    Dental  (+) Dental Advidsory Given, Teeth Intact   Pulmonary Patient abstained from smoking., former smoker   Pulmonary exam normal        Cardiovascular hypertension, (-) Past MI and (-) Cardiac Stents Normal cardiovascular exam(-) dysrhythmias (-) Valvular Problems/Murmurs     Neuro/Psych neg Seizures PSYCHIATRIC DISORDERS Anxiety Depression    ANTERIOR CERVICAL DECOMP/DISCECTOMY FUSION  Neuromuscular disease (Multiple sclerosis)    GI/Hepatic negative GI ROS,,,(+)     substance abuse (h/o cocaine and marijuana use)    Endo/Other  negative endocrine ROS    Renal/GU negative Renal ROS  negative genitourinary   Musculoskeletal   Abdominal Normal abdominal exam  (+)   Peds  Hematology negative hematology ROS (+)   Anesthesia Other Findings Past Medical History: No date: Allergy No date: Anxiety No date: Depression No date: Multiple sclerosis (HCC) No date: Neck pain No date: Neuromuscular disorder [facility])  Past Surgical History: 02/20/2020: ANTERIOR CERVICAL DECOMP/DISCECTOMY FUSION; N/A     Comment:  Procedure: ANTERIOR CERVICAL DECOMPRESSION/DISCECTOMY               FUSION 1 LEVEL C3-4;  Surgeon: Venetia Night, MD;                Location: [facility] ORS;  Service: Neurosurgery;  Laterality:               N/A; 05/24/2022: COLONOSCOPY; N/A     Comment:  Procedure: COLONOSCOPY;  Surgeon: Jaynie Collins,              DO;  Location: [facility] ENDOSCOPY;  Service:               Gastroenterology;  Laterality: N/A; 2005: CRANIOPLASTY     Comment:  subdural hematoma  that had to  be removed 2014: NECK SURGERY No date: SPINAL FUSION     Reproductive/Obstetrics negative OB ROS                             Anesthesia Physical Anesthesia Plan  ASA: 2  Anesthesia Plan: General   Post-op Pain Management: Minimal or no pain anticipated   Induction: Intravenous  PONV Risk Score and Plan: 2 and Propofol infusion and TIVA  Airway Management Planned: Natural Airway and Simple Face Mask  Additional Equipment:   Intra-op Plan:   Post-operative Plan: Extubation in OR  Informed Consent: I have reviewed the patients History and Physical, chart, labs and discussed the procedure including the risks, benefits and alternatives for the proposed anesthesia with the patient or authorized representative who has indicated his/her understanding and acceptance.     Dental Advisory Given  Plan Discussed with: CRNA and Surgeon  Anesthesia Plan Comments:        Anesthesia Quick Evaluation

## 2022-12-10 NOTE — Anesthesia Postprocedure Evaluation (Signed)
Anesthesia Post Note  Patient: Joshua [name]  Procedure(s) Performed: LEFT CUBITAL TUNNEL DECOMPRESSION (Left: Elbow)  Patient location during evaluation: PACU Anesthesia Type: General Level of consciousness: awake and alert Pain management: pain level controlled Vital Signs Assessment: post-procedure vital signs reviewed and stable Respiratory status: spontaneous breathing, nonlabored ventilation, respiratory function stable and patient connected to nasal cannula oxygen Cardiovascular status: blood pressure returned to baseline and stable Postop Assessment: no apparent nausea or vomiting Anesthetic complications: no   No notable events documented.   Last Vitals:  Vitals:   12/07/22 0845 12/07/22 0908  BP: 124/88 (!) 116/100  Pulse: 66 67  Resp: 16   Temp:    SpO2: 100% 100%    Last Pain:  Vitals:   12/07/22 0908  TempSrc:   PainSc: 0-No pain                 Lenard Simmer

## 2022-12-17 NOTE — Progress Notes (Unsigned)
   REFERRING PHYSICIAN:  Armando Gang, Fnp [address] [place],  [place] [address]  DOS: 12/07/22 left ulnar nerve decompression at the elbow  HISTORY OF PRESENT ILLNESS: Joshua [name] is 2 weeks status post above surgery. Given norco after above surgery.   He is not having any pain in left elbow. Numbness and tingling in left arm is gone.  PHYSICAL EXAMINATION:  NEUROLOGICAL:  General: In no acute distress.   Awake, alert, oriented to person, place, and time.  Pupils equal round and reactive to light.    Incision c/d/I, no signs of infection.   He has good ROM of his left arm with no gross weakness noted.   Imaging:  Nothing new to review.   Assessment / Plan: Joshua [name] is doing well s/p above surgery. Treatment options reviewed with patient and following plan made:    - Reviewed wound care.  - He can return to activity as tolerated.  - He wants to get right side scheduled (ulnar nerve decompression). Okay to schedule per Dr. Katrinka Blazing. Kendelyn to call him with dates.   He continues with significant neck pain. Prescription for flexeril given to use prn. He is aware it can make his sleepy.   He asks about a second opinion for his neck. Will discuss with Dr. Myer Haff and see who he recommends. Will send patient a message.   Advised to contact the office if any questions or concerns arise.   Drake Leach PA-C Dept of Neurosurgery

## 2022-12-20 ENCOUNTER — Ambulatory Visit (INDEPENDENT_AMBULATORY_CARE_PROVIDER_SITE_OTHER): Payer: 59 | Admitting: Orthopedic Surgery

## 2022-12-20 ENCOUNTER — Encounter: Payer: Self-pay | Admitting: Neurosurgery

## 2022-12-20 ENCOUNTER — Encounter: Payer: Self-pay | Admitting: Orthopedic Surgery

## 2022-12-20 VITALS — BP 122/76 | Ht 67.0 in | Wt 165.0 lb

## 2022-12-20 DIAGNOSIS — G5622 Lesion of ulnar nerve, left upper limb: Secondary | ICD-10-CM

## 2022-12-20 DIAGNOSIS — Z981 Arthrodesis status: Secondary | ICD-10-CM

## 2022-12-20 DIAGNOSIS — Z9889 Other specified postprocedural states: Secondary | ICD-10-CM

## 2022-12-20 DIAGNOSIS — G5621 Lesion of ulnar nerve, right upper limb: Secondary | ICD-10-CM | POA: Insufficient documentation

## 2022-12-20 MED ORDER — CYCLOBENZAPRINE HCL 10 MG PO TABS: 10.0000 mg | ORAL_TABLET | Freq: Three times a day (TID) | ORAL | 0 refills | Status: DC | PRN

## 2022-12-21 DIAGNOSIS — G5621 Lesion of ulnar nerve, right upper limb: Secondary | ICD-10-CM

## 2022-12-21 DIAGNOSIS — M5412 Radiculopathy, cervical region: Secondary | ICD-10-CM

## 2022-12-21 DIAGNOSIS — M542 Cervicalgia: Secondary | ICD-10-CM

## 2022-12-21 DIAGNOSIS — Z981 Arthrodesis status: Secondary | ICD-10-CM

## 2022-12-24 ENCOUNTER — Other Ambulatory Visit: Payer: Self-pay

## 2022-12-24 DIAGNOSIS — Z01818 Encounter for other preprocedural examination: Secondary | ICD-10-CM

## 2022-12-28 ENCOUNTER — Ambulatory Visit: Payer: 59

## 2023-01-10 ENCOUNTER — Encounter: Payer: Self-pay | Admitting: Neurology

## 2023-01-10 ENCOUNTER — Encounter
Admission: RE | Admit: 2023-01-10 | Discharge: 2023-01-10 | Disposition: A | Discharge status: Home / Self Care | Payer: 59 | Source: Ambulatory Visit | Attending: Neurosurgery | Admitting: Neurosurgery

## 2023-01-10 ENCOUNTER — Other Ambulatory Visit: Payer: Self-pay

## 2023-01-10 VITALS — Ht 67.0 in | Wt 165.0 lb

## 2023-01-10 DIAGNOSIS — Z01812 Encounter for preprocedural laboratory examination: Secondary | ICD-10-CM

## 2023-01-10 DIAGNOSIS — E876 Hypokalemia: Secondary | ICD-10-CM

## 2023-01-10 HISTORY — DX: Iron deficiency anemia, unspecified: D50.9

## 2023-01-10 NOTE — Patient Instructions (Signed)
Your procedure is scheduled on: Thursday, December 19 Report to the Registration Desk on the 1st floor of the [facility] To find out your arrival time, please call [phone] between 1PM - 3PM on: Wednesday, December 18 If your arrival time is 6:00 am, do not arrive before that time as the [facility] entrance doors do not open until 6:00 am.  REMEMBER: Instructions that are not followed completely may result in serious medical risk, up to and including death; or upon the discretion of your surgeon and anesthesiologist your surgery may need to be rescheduled.  Do not eat food after midnight the night before surgery.  No gum chewing or hard candies.  You may however, drink CLEAR liquids up to 2 hours before you are scheduled to arrive for your surgery. Do not drink anything within 2 hours of your scheduled arrival time.  Clear liquids include: - water  - apple juice without pulp - gatorade (not RED colors) - black coffee or tea (Do NOT add milk or creamers to the coffee or tea) Do NOT drink anything that is not on this list.  Sildenafil (Viagra) - do not take at least 2 days before surgery.   Continue taking all of your other prescription medications up until the day of surgery.  ON THE DAY OF SURGERY ONLY TAKE THESE MEDICATIONS WITH SIPS OF WATER:  Prednisone (if it is your regular day for taking) Tamsulosin (Flomax)  No Alcohol for 24 hours before or after surgery.  No Smoking including e-cigarettes for 24 hours before surgery.  No chewable tobacco products for at least 6 hours before surgery.  No nicotine patches on the day of surgery.  Do not use any "recreational" drugs for at least a week (preferably 2 weeks) before your surgery.  Please be advised that the combination of cocaine and anesthesia may have negative outcomes, up to and including death. If you test positive for cocaine, your surgery will be cancelled.  On the morning of surgery brush your teeth with  toothpaste and water, you may rinse your mouth with mouthwash if you wish. Do not swallow any toothpaste or mouthwash.  Use CHG Soap as directed on instruction sheet.  Do not wear jewelry, make-up, hairpins, clips or nail polish.  For welded (permanent) jewelry: bracelets, anklets, waist bands, etc.  Please have this removed prior to surgery.  If it is not removed, there is a chance that hospital personnel will need to cut it off on the day of surgery.  Do not wear lotions, powders, or perfumes.   Do not shave body hair from the neck down 48 hours before surgery.  Contact lenses, hearing aids and dentures may not be worn into surgery.  Do not bring valuables to the hospital. [facility] is not responsible for any missing/lost belongings or valuables.   Notify your doctor if there is any change in your medical condition (cold, fever, infection).  Wear comfortable clothing (specific to your surgery type) to the hospital.  After surgery, you can help prevent lung complications by doing breathing exercises.  Take deep breaths and cough every 1-2 hours.   If you are being discharged the day of surgery, you will not be allowed to drive home. You will need a responsible individual to drive you home and stay with you for 24 hours after surgery.   If you are taking public transportation, you will need to have a responsible individual with you.  Please call the Pre-admissions Testing Dept. at [phone] if you have any questions about these instructions.  Surgery Visitation Policy:  Patients having surgery or a procedure may have two visitors.  Children under the age of 5 must have an adult with them who is not the patient.     Preparing for Surgery with CHLORHEXIDINE GLUCONATE (CHG) Soap  Chlorhexidine Gluconate (CHG) Soap  o An antiseptic cleaner that kills germs and bonds with the skin to continue killing germs even after washing  o Used for showering the night before  surgery and morning of surgery  Before surgery, you can play an important role by reducing the number of germs on your skin.  CHG (Chlorhexidine gluconate) soap is an antiseptic cleanser which kills germs and bonds with the skin to continue killing germs even after washing.  Please do not use if you have an allergy to CHG or antibacterial soaps. If your skin becomes reddened/irritated stop using the CHG.  1. Shower the NIGHT BEFORE SURGERY and the MORNING OF SURGERY with CHG soap.  2. If you choose to wash your hair, wash your hair first as usual with your normal shampoo.  3. After shampooing, rinse your hair and body thoroughly to remove the shampoo.  4. Use CHG as you would any other liquid soap. You can apply CHG directly to the skin and wash gently with a scrungie or a clean washcloth.  5. Apply the CHG soap to your body only from the neck down. Do not use on open wounds or open sores. Avoid contact with your eyes, ears, mouth, and genitals (private parts). Wash face and genitals (private parts) with your normal soap.  6. Wash thoroughly, paying special attention to the area where your surgery will be performed.  7. Thoroughly rinse your body with warm water.  8. Do not shower/wash with your normal soap after using and rinsing off the CHG soap.  9. Pat yourself dry with a clean towel.  10. Wear clean pajamas to bed the night before surgery.  12. Place clean sheets on your bed the night of your first shower and do not sleep with pets.  13. Shower again with the CHG soap on the day of surgery prior to arriving at the hospital.  14. Do not apply any deodorants/lotions/powders.  15. Please wear clean clothes to the hospital.

## 2023-01-17 ENCOUNTER — Encounter: Payer: Self-pay | Admitting: Urgent Care

## 2023-01-17 ENCOUNTER — Encounter: Payer: 59 | Admitting: Neurosurgery

## 2023-01-17 ENCOUNTER — Encounter
Admission: RE | Admit: 2023-01-17 | Discharge: 2023-01-17 | Disposition: A | Discharge status: Home / Self Care | Payer: 59 | Source: Ambulatory Visit | Attending: Neurosurgery | Admitting: Neurosurgery

## 2023-01-17 DIAGNOSIS — Z01818 Encounter for other preprocedural examination: Secondary | ICD-10-CM | POA: Diagnosis present

## 2023-01-17 DIAGNOSIS — Z01812 Encounter for preprocedural laboratory examination: Secondary | ICD-10-CM | POA: Insufficient documentation

## 2023-01-17 LAB — BASIC METABOLIC PANEL
Anion gap: 8 (ref 5–15)
BUN: 15 mg/dL (ref 6–20)
CO2: 26 mmol/L (ref 22–32)
Calcium: 8.7 mg/dL — ABNORMAL LOW (ref 8.9–10.3)
Chloride: 100 mmol/L (ref 98–111)
Creatinine, Ser: 0.88 mg/dL (ref 0.61–1.24)
GFR, Estimated: >60 mL/min (ref >60)
Glucose, Bld: 111 mg/dL — ABNORMAL HIGH (ref 70–99)
Potassium: 3.3 mmol/L — ABNORMAL LOW (ref 3.5–5.1)
Sodium: 134 mmol/L — ABNORMAL LOW (ref 135–145)

## 2023-01-19 MED ORDER — ORAL CARE MOUTH RINSE: 15.0000 mL | Freq: Once | OROMUCOSAL | Status: AC

## 2023-01-19 MED ORDER — CEFAZOLIN SODIUM-DEXTROSE 2-4 GM/100ML-% IV SOLN
2.0000 g | INTRAVENOUS | Status: AC
  Administered 2023-01-20: 2 g via INTRAVENOUS

## 2023-01-19 MED ORDER — CEFAZOLIN IN SODIUM CHLORIDE 2-0.9 GM/100ML-% IV SOLN
2.0000 g | Freq: Once | INTRAVENOUS | Status: DC
  Filled 2023-01-19: qty 100

## 2023-01-19 MED ORDER — CHLORHEXIDINE GLUCONATE 0.12 % MT SOLN
15.0000 mL | Freq: Once | OROMUCOSAL | Status: AC
  Administered 2023-01-20: 15 mL via OROMUCOSAL

## 2023-01-19 MED ORDER — LACTATED RINGERS IV SOLN: INTRAVENOUS | Status: DC

## 2023-01-19 NOTE — Anesthesia Preprocedure Evaluation (Signed)
Anesthesia Evaluation  Patient identified by MRN, date of birth, ID band Patient awake    Reviewed: Allergy & Precautions, H&P , NPO status , Patient's Chart, lab work & pertinent test results  History of Anesthesia Complications Negative for: history of anesthetic complications  Airway Mallampati: III  TM Distance: >3 FB Neck ROM: full    Dental  (+) Edentulous Lower, Edentulous Upper   Pulmonary Current SmokerPatient did not abstain from smoking.   Pulmonary exam normal        Cardiovascular hypertension, (-) Past MI and (-) Cardiac Stents Normal cardiovascular exam(-) dysrhythmias (-) Valvular Problems/Murmurs     Neuro/Psych neg Seizures PSYCHIATRIC DISORDERS Anxiety Depression    ANTERIOR CERVICAL DECOMP/DISCECTOMY FUSION  Neuromuscular disease (Multiple sclerosis)    GI/Hepatic negative GI ROS,,,(+)     substance abuse (h/o cocaine and marijuana use)    Endo/Other  negative endocrine ROS    Renal/GU negative Renal ROS  negative genitourinary   Musculoskeletal   Abdominal Normal abdominal exam  (+)   Peds  Hematology negative hematology ROS (+)   Anesthesia Other Findings Past Medical History: No date: Allergy No date: Anxiety No date: Depression No date: Multiple sclerosis (HCC) No date: Neck pain No date: Neuromuscular disorder [facility])  Past Surgical History: 02/20/2020: ANTERIOR CERVICAL DECOMP/DISCECTOMY FUSION; N/A     Comment:  Procedure: ANTERIOR CERVICAL DECOMPRESSION/DISCECTOMY               FUSION 1 LEVEL C3-4;  Surgeon: Venetia Night, MD;                Location: [facility] ORS;  Service: Neurosurgery;  Laterality:               N/A; 05/24/2022: COLONOSCOPY; N/A     Comment:  Procedure: COLONOSCOPY;  Surgeon: Jaynie Collins,              DO;  Location: [facility] ENDOSCOPY;  Service:               Gastroenterology;  Laterality: N/A; 2005: CRANIOPLASTY     Comment:  subdural hematoma  that had  to be removed 2014: NECK SURGERY No date: SPINAL FUSION     Reproductive/Obstetrics negative OB ROS                             Anesthesia Physical Anesthesia Plan  ASA: 2  Anesthesia Plan: General   Post-op Pain Management: Minimal or no pain anticipated and Regional block*   Induction: Intravenous  PONV Risk Score and Plan: 2 and Propofol infusion and TIVA  Airway Management Planned: Natural Airway and Simple Face Mask  Additional Equipment:   Intra-op Plan:   Post-operative Plan:   Informed Consent: I have reviewed the patients History and Physical, chart, labs and discussed the procedure including the risks, benefits and alternatives for the proposed anesthesia with the patient or authorized representative who has indicated his/her understanding and acceptance.     Dental Advisory Given  Plan Discussed with: CRNA and Surgeon  Anesthesia Plan Comments:        Anesthesia Quick Evaluation

## 2023-01-20 ENCOUNTER — Other Ambulatory Visit: Payer: Self-pay

## 2023-01-20 ENCOUNTER — Ambulatory Visit: Payer: 59 | Admitting: Urgent Care

## 2023-01-20 ENCOUNTER — Ambulatory Visit: Payer: 59 | Admitting: Anesthesiology

## 2023-01-20 ENCOUNTER — Ambulatory Visit
Admission: RE | Admit: 2023-01-20 | Discharge: 2023-01-20 | Disposition: A | Discharge status: Home / Self Care | Payer: 59 | Attending: Neurosurgery | Admitting: Neurosurgery

## 2023-01-20 ENCOUNTER — Encounter
Admission: RE | Disposition: A | Discharge status: Home / Self Care | Payer: Self-pay | Source: Home / Self Care | Attending: Neurosurgery

## 2023-01-20 DIAGNOSIS — G5623 Lesion of ulnar nerve, bilateral upper limbs: Secondary | ICD-10-CM | POA: Diagnosis present

## 2023-01-20 DIAGNOSIS — F17210 Nicotine dependence, cigarettes, uncomplicated: Secondary | ICD-10-CM | POA: Insufficient documentation

## 2023-01-20 DIAGNOSIS — Z01818 Encounter for other preprocedural examination: Secondary | ICD-10-CM

## 2023-01-20 DIAGNOSIS — G5621 Lesion of ulnar nerve, right upper limb: Secondary | ICD-10-CM | POA: Diagnosis not present

## 2023-01-20 DIAGNOSIS — G35 Multiple sclerosis: Secondary | ICD-10-CM | POA: Diagnosis not present

## 2023-01-20 SURGERY — ULNAR NERVE DECOMPRESSION
Anesthesia: General | Site: Elbow | Laterality: Right

## 2023-01-20 MED ORDER — OXYCODONE HCL 5 MG PO TABS: 5.0000 mg | ORAL_TABLET | Freq: Once | ORAL | Status: DC | PRN

## 2023-01-20 MED ORDER — MIDAZOLAM HCL 2 MG/2ML IJ SOLN
INTRAMUSCULAR | Status: DC | PRN
  Administered 2023-01-20: 2 mg via INTRAVENOUS

## 2023-01-20 MED ORDER — 0.9 % SODIUM CHLORIDE (POUR BTL) OPTIME
TOPICAL | Status: DC | PRN
  Administered 2023-01-20: 500 mL

## 2023-01-20 MED ORDER — LIDOCAINE HCL (PF) 1 % IJ SOLN
INTRAMUSCULAR | Status: AC
  Filled 2023-01-20: qty 30

## 2023-01-20 MED ORDER — BUPIVACAINE-EPINEPHRINE 0.5% -1:200000 IJ SOLN
INTRAMUSCULAR | Status: DC | PRN
  Administered 2023-01-20: 8 mL

## 2023-01-20 MED ORDER — CHLORHEXIDINE GLUCONATE 0.12 % MT SOLN
OROMUCOSAL | Status: AC
  Filled 2023-01-20: qty 15

## 2023-01-20 MED ORDER — FENTANYL CITRATE (PF) 100 MCG/2ML IJ SOLN
INTRAMUSCULAR | Status: DC | PRN
  Administered 2023-01-20 (×2): 50 ug via INTRAVENOUS

## 2023-01-20 MED ORDER — CEFAZOLIN SODIUM-DEXTROSE 2-4 GM/100ML-% IV SOLN
INTRAVENOUS | Status: AC
  Filled 2023-01-20: qty 100

## 2023-01-20 MED ORDER — ACETAMINOPHEN 10 MG/ML IV SOLN
1000.0000 mg | Freq: Once | INTRAVENOUS | Status: DC | PRN
Start: 2023-01-20 — End: 2023-01-20

## 2023-01-20 MED ORDER — FENTANYL CITRATE (PF) 100 MCG/2ML IJ SOLN: 25.0000 ug | INTRAMUSCULAR | Status: DC | PRN

## 2023-01-20 MED ORDER — DROPERIDOL 2.5 MG/ML IJ SOLN: 0.6250 mg | Freq: Once | INTRAMUSCULAR | Status: DC | PRN

## 2023-01-20 MED ORDER — HYDROCODONE-ACETAMINOPHEN 5-325 MG PO TABS: 1.0000 | ORAL_TABLET | ORAL | 0 refills | Status: DC | PRN

## 2023-01-20 MED ORDER — PROPOFOL 10 MG/ML IV BOLUS
INTRAVENOUS | Status: DC | PRN
  Administered 2023-01-20 (×4): 50 mg via INTRAVENOUS

## 2023-01-20 MED ORDER — GLYCOPYRROLATE 0.2 MG/ML IJ SOLN
INTRAMUSCULAR | Status: DC | PRN
  Administered 2023-01-20: .2 mg via INTRAVENOUS

## 2023-01-20 MED ORDER — BUPIVACAINE-EPINEPHRINE (PF) 0.5% -1:200000 IJ SOLN
INTRAMUSCULAR | Status: AC
  Filled 2023-01-20: qty 10

## 2023-01-20 MED ORDER — MIDAZOLAM HCL 2 MG/2ML IJ SOLN
INTRAMUSCULAR | Status: AC
  Filled 2023-01-20: qty 2

## 2023-01-20 MED ORDER — BUPIVACAINE HCL (PF) 0.5 % IJ SOLN
INTRAMUSCULAR | Status: AC
  Filled 2023-01-20: qty 30

## 2023-01-20 MED ORDER — OXYCODONE HCL 5 MG/5ML PO SOLN: 5.0000 mg | Freq: Once | ORAL | Status: DC | PRN

## 2023-01-20 MED ORDER — PROPOFOL 10 MG/ML IV BOLUS
INTRAVENOUS | Status: AC
  Filled 2023-01-20: qty 20

## 2023-01-20 MED ORDER — FENTANYL CITRATE (PF) 100 MCG/2ML IJ SOLN
INTRAMUSCULAR | Status: AC
  Filled 2023-01-20: qty 2

## 2023-01-20 MED ORDER — ONDANSETRON HCL 4 MG/2ML IJ SOLN
INTRAMUSCULAR | Status: DC | PRN
  Administered 2023-01-20: 4 mg via INTRAVENOUS

## 2023-01-20 MED ORDER — METHYLPREDNISOLONE ACETATE 40 MG/ML IJ SUSP
INTRAMUSCULAR | Status: AC
  Filled 2023-01-20: qty 1

## 2023-01-20 SURGICAL SUPPLY — 30 items
BNDG GAUZE DERMACEA FLUFF 4 (GAUZE/BANDAGES/DRESSINGS) ×1 IMPLANT
BRUSH SCRUB EZ 4% CHG (MISCELLANEOUS) ×1 IMPLANT
CHLORAPREP W/TINT 26 (MISCELLANEOUS) ×1 IMPLANT
CORD BIP STRL DISP 12FT (MISCELLANEOUS) IMPLANT
DERMABOND ADVANCED .7 DNX12 (GAUZE/BANDAGES/DRESSINGS) ×1 IMPLANT
DRAPE SURG 17X11 SM STRL (DRAPES) ×2 IMPLANT
DRSG OPSITE POSTOP 4X6 (GAUZE/BANDAGES/DRESSINGS) IMPLANT
FORCEPS JEWEL BIP 4-3/4 STR (INSTRUMENTS) ×1 IMPLANT
GAUZE SPONGE 4X4 12PLY STRL (GAUZE/BANDAGES/DRESSINGS) ×1 IMPLANT
GLOVE BIOGEL PI IND STRL 8 (GLOVE) ×1 IMPLANT
GLOVE SURG SYN 7.5 E (GLOVE) ×1 IMPLANT
GLOVE SURG SYN 7.5 PF PI (GLOVE) ×1 IMPLANT
GOWN STRL REUS W/ TWL LRG LVL3 (GOWN DISPOSABLE) ×1 IMPLANT
GOWN STRL REUS W/ TWL XL LVL3 (GOWN DISPOSABLE) ×1 IMPLANT
KIT TURNOVER KIT A (KITS) ×1 IMPLANT
MANIFOLD NEPTUNE II (INSTRUMENTS) ×1 IMPLANT
NS IRRIG 500ML POUR BTL (IV SOLUTION) ×1 IMPLANT
PACK BASIN MINOR ARMC (MISCELLANEOUS) ×1 IMPLANT
PACK EXTREMITY ARMC (MISCELLANEOUS) ×1 IMPLANT
PROBE NEUROSIGN BIPOL (MISCELLANEOUS) IMPLANT
SPONGE KITTNER 5P (MISCELLANEOUS) IMPLANT
STOCKINETTE IMPERVIOUS 9X36 MD (GAUZE/BANDAGES/DRESSINGS) ×1 IMPLANT
SURGIFLO W/THROMBIN 8M KIT (HEMOSTASIS) IMPLANT
SUT ETHILON 6 0 9-3 1X18 BLK (SUTURE) IMPLANT
SUT STRATA 3-0 15 PS-2 (SUTURE) ×1 IMPLANT
SUT STRATA 3-0 15 RB-1.5 (SUTURE) IMPLANT
SUT VIC AB 2-0 SH 27XBRD (SUTURE) IMPLANT
SUT VIC AB 3-0 SH 27X BRD (SUTURE) IMPLANT
TRAP FLUID SMOKE EVACUATOR (MISCELLANEOUS) ×1 IMPLANT
WATER STERILE IRR 500ML POUR (IV SOLUTION) ×1 IMPLANT

## 2023-01-20 NOTE — Interval H&P Note (Signed)
History and Physical Interval Note:  01/20/2023 7:04 AM  Joshua [name]  has presented today for surgery, with the diagnosis of G56.21 Cubital tunnel syndrome on right.  The various methods of treatment have been discussed with the patient and family. After consideration of risks, benefits and other options for treatment, the patient has consented to  Procedure(s): RIGHT CUBITAL TUNNEL DECOMPRESSION (Right) as a surgical intervention.  The patient's history has been reviewed, patient examined, no change in status, stable for surgery.  I have reviewed the patient's chart and labs.  Questions were answered to the patient's satisfaction.    Heart and lungs clear   [name]

## 2023-01-20 NOTE — Op Note (Signed)
Indications: 46M with history of progressive right-sided ulnar neuropathy and hand weakness refractory to conservative management.  Findings: Severe Compression of ulnar nerve at the cubital tunnel  Preoperative Diagnosis:  Right Side cubital tunnel syndrome  Postoperative Diagnosis: same   EBL: 5cc IVF: See anesthesia report Drains: none Disposition:Stable to PACU Complications: none  No foley catheter was placed.   Preoperative Note: 46M with a history of progressive right ulnar neuropathy and hand weakness refractory to conservative management.  They had tried rest, padding, and watchful waiting but had continued progressive symptoms.  Given the progression of her ulnar neuropathy plan was made for ulnar nerve decompression without transposition.  Risk of surgery is discussed and include: Infection, bleeding, wound healing issues, nerve injury, pain, failure to relieve the symptoms, need for further surgery.  Procedure:  1) right ulnar nerve decompression at the elbow   Procedure: After obtaining informed consent, the patient taken to the operating room, placed in supine position, monitored anesthesia care was induced.  They were given preoperative antibiotics.  Prepped and draped in the usual fashion.  Comprehensive timeout was performed verifying the patient's name, MRN, planned procedure.  The humerus was padded, elbow was externally rotated, a curvilinear incision over the medial aspect of the elbow was planned.  This went proximal to the medial epicondyle as well as distal to cover the space from the cubital tunnel to the intermuscular septum/arcade of Struthers.  Local anesthetic was injected, skin was opened sharply, the skin was dissected down to the subcutaneous fascia.  Care was taken not to sacrifice any cutaneous nerves while doing this dissection.  We are able to feel and palpate the intermuscular septum.  This was where we were able to identify the nerve initially.  The  nerve was underneath the septum and being compressed by it, as well as being compressed by a arcade of Struthers.  The arcade was divided, the septum was resected.  We then continued to follow the nerve distally decompressing on the way.  We found it in the retrocondylar groove, we removed all the soft tissue overlying.  We then continue to follow it distally.  We were able to identify the fascia overlying the heads of the FCU.  This was divided sharply.  We are then able to split the heads of the FCU and identify Osborne's band.  This was severely compressive.  We then divided this.  At this point the nerve was well decompressed from proximal to the arcade of Struthers to distal to Osborne's band.  There were no areas of ongoing compression.  The nerve did not dislocate when the elbow was bent there were no evidence of any ongoing compression.  The wound was copiously irrigated.  The wound was then closed in multiple layers.  Sterile dressing was applied.  No immediate complications.  Sponge and pattie counts were correct at the end of the procedure.   I performed this procedure without an assistant surgeon  [name], MD/MSCR

## 2023-01-20 NOTE — Transfer of Care (Signed)
Immediate Anesthesia Transfer of Care Note  Patient: Joshua [name]  Procedure(s) Performed: RIGHT CUBITAL TUNNEL DECOMPRESSION (Right: Elbow)  Patient Location: PACU  Anesthesia Type:General  Level of Consciousness: awake, alert , and oriented  Airway & Oxygen Therapy: Patient Spontanous Breathing  Post-op Assessment: Report given to RN and Post -op Vital signs reviewed and stable  Post vital signs: Reviewed  Last Vitals:  Vitals Value Taken Time  BP 113/74   Temp    Pulse 85 01/20/23 0815  Resp 15 01/20/23 0815  SpO2 100 % 01/20/23 0815  Vitals shown include unfiled device data.  Last Pain:  Vitals:   01/20/23 0624  TempSrc: Temporal  PainSc: 0-No pain         Complications: No notable events documented.

## 2023-01-20 NOTE — Progress Notes (Signed)
Referring Physician:  No referring provider defined for this encounter.  Primary Physician:  Armando Gang, FNP  History of Present Illness: 01/20/2023 Joshua [name] is here today with a chief complaint of bilateral ulnar neuropathy.  He has been followed for cervical myelopathy and radiculopathy.  He comes in today for his findings of bilateral severe ulnar neuropathy.  He had his left side recently treated, here today for right side. Has been dropping things, has noted decreased ability to do things like laces and do buttons.  He feels like this is getting progressively worse.  He does improve with steroid administration.    Review of Systems:  A 10 point review of systems is negative, except for the pertinent positives and negatives detailed in the HPI.  Past Medical History: Past Medical History:  Diagnosis Date   Allergy    Anemia    Anxiety    Cervical fusion syndrome    Cervical radicular pain    Cocaine abuse (HCC)    last used in 2007   Cubital tunnel syndrome    Depression    Iron deficiency anemia    Marijuana use    Multiple sclerosis (HCC)    Neck pain    Subdural hematoma (HCC)     Past Surgical History: Past Surgical History:  Procedure Laterality Date   ANTERIOR CERVICAL DECOMP/DISCECTOMY FUSION N/A 02/20/2020   Procedure: ANTERIOR CERVICAL DECOMPRESSION/DISCECTOMY FUSION 1 LEVEL C3-4;  Surgeon: Venetia Night, MD;  Location: [facility] ORS;  Service: Neurosurgery;  Laterality: N/A;   CERVICAL FUSION  2014   C4-7   COLONOSCOPY N/A 05/24/2022   Procedure: COLONOSCOPY;  Surgeon: Jaynie Collins, DO;  Location: [facility] ENDOSCOPY;  Service: Gastroenterology;  Laterality: N/A;   COLONOSCOPY N/A 06/10/2022   Procedure: COLONOSCOPY;  Surgeon: Jaynie Collins, DO;  Location: [facility] ENDOSCOPY;  Service: Gastroenterology;  Laterality: N/A;   CRANIOPLASTY  2005   subdural hematoma  that had to be removed   LEFT CUBITAL TUNNEL DECOMPRESSION Left  12/07/2022    Allergies: Allergies as of 12/24/2022 - Review Complete 12/20/2022  Allergen Reaction Noted   Penicillins Hives 02/12/2015    Medications:  Current Facility-Administered Medications:    ceFAZolin (ANCEF) IVPB 2g/100 mL premix, 2 g, Intravenous, 60 min Pre-Op, [name], MD   lactated ringers infusion, , Intravenous, Continuous, Lenard Simmer, MD, Last Rate: 10 mL/hr at 01/20/23 0654, New Bag at 01/20/23 0654  Social History: Social History   Tobacco Use   Smoking status: Former    Current packs/day: 0.00    Average packs/day: 1 pack/day for 20.0 years (20.0 ttl pk-yrs)    Types: Cigarettes    Quit date: 10/21/2022    Years since quitting: 0.2   Smokeless tobacco: Never   Tobacco comments:    uses Chantix , also smoke marijuana  Vaping Use   Vaping status: Never Used  Substance Use Topics   Alcohol use: Yes    Comment: rare shot   Drug use: Not Currently    Types: Marijuana, Cocaine    Comment: last used cocaine in 2007    Family Medical History: Family History  Problem Relation Age of Onset   Hypertension Mother    Hypertension Father    Multiple sclerosis Sister    Aneurysm Brother     Physical Examination: Vitals:   01/20/23 0624  BP: 124/80  Pulse: 81  Resp: 20  Temp: 97.6 F (36.4 C)  SpO2: 98%    General: Patient  is in no apparent distress. Attention to examination is appropriate.  Neck:   Supple.  Full range of motion.  Respiratory: Patient is breathing without any difficulty.   NEUROLOGICAL:     Awake, alert, oriented to person, place, and time.  Speech is clear and fluent.   Cranial Nerves: Pupils equal round and reactive to light.  Facial tone is symmetric. Shoulder shrug is symmetric. Tongue protrusion is midline.  There is no pronator drift.  Motor Exam:  Motor examination demonstrates intrinsic hand wasting that is very mild, left worse than right.  On physical examination he does show some Wartenberg's sign on  the left when compared to the right.  He has weakness in his ulnar lumbricals of approximately 4- out of 5, 4- out of 5 intrinsic finger function.  His grip is 5 - to 4+.  On the right side he is 4+ in the ulnar intrinsic musculature. He He has decreased sensation in his hands.  He has positive Tinel sign at the bilateral elbows.  Does not demonstrate snapping or subluxation of his ulnar nerves bilaterally on physical examination today.  Medical Decision Making  Imaging: Narrative & Impression  CLINICAL DATA:  Chronic neck pain.  History of multiple sclerosis   EXAM: MRI CERVICAL SPINE WITHOUT CONTRAST   TECHNIQUE: Multiplanar, multisequence MR imaging of the cervical spine was performed. No intravenous contrast was administered.   COMPARISON:  MRI 07/05/2021   FINDINGS: Technical Note: Despite efforts by the technologist and patient, motion artifact is present on today's exam and could not be eliminated. This reduces exam sensitivity and specificity.   Alignment: Straightening of the cervical lordosis. Trace anterolisthesis at C7-T1.   Vertebrae: Prior C3-4 and C6-7 ACDF. Additional interbody fusion at C4-5 where there is solid arthrodesis. No fracture. No evidence of discitis. No marrow replacing bone lesion.   Cord: Redemonstration of T2 hyperintense lesions within the cord at the C2 and C5 levels. Additional known lesion at the T1-T2 level is not well seen on today's motion degraded examination. The axial images do not extend caudally to that location. No new site of focal cervical cord signal abnormality.   Posterior Fossa, vertebral arteries, paraspinal tissues: Negative.   Disc levels:   C2-C3: Shallow central disc protrusion. No foraminal or canal stenosis. Unchanged.   C3-C4: Prior ACDF. No significant foraminal or canal stenosis. Unchanged.   C4-C5: Prior interbody fusion. No significant canal stenosis. Mild bilateral foraminal narrowing. Unchanged.    C5-C6: Endplate and uncovertebral spurring contribute to mild-moderate bilateral foraminal stenosis. No significant canal stenosis. Unchanged.   C6-C7: Prior ACDF. No canal stenosis. Mild bilateral foraminal stenosis. Unchanged.   C7-T1: Left-sided facet arthropathy. No significant foraminal or canal stenosis. Unchanged.   IMPRESSION: 1. Redemonstration of T2 hyperintense lesions within the cord at the C2 and C5 levels consistent with known multiple sclerosis. Additional known lesion at the T1-T2 level is not well seen on today's motion degraded examination. No new site of focal cervical cord signal abnormality. 2. Mild-moderate bilateral foraminal stenosis at C5-6. Mild bilateral foraminal stenosis at C4-5 and C6-7. No canal stenosis at any level.     Electronically Signed   By: Duanne Guess D.O.   On: 10/13/2022 16:22    Electrodiagnostics: Evaluation of his outside testing demonstrates severe ulnar neuropathy bilaterally at the elbows.  I have personally reviewed the images and electrodiagnostics and agree with the above interpretation.  Assessment and Plan: Mr. Oberg is a pleasant 52 y.o. 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  He also has a history of multiple sclerosis.  On physical examination today he demonstrates intrinsic hand weakness especially in the ulnar distribution on the left worse than the right but also with weakness on the right.  He has slightly decreased sensation in the ulnar distribution but does have a history of cervical myeloradiculopathy which makes a focal examination of the peripheral nerve structure more difficult.  He often wakes with complete numbness in tingling in his ulnar distribution.  His EMG demonstrates severe cubital tunnel bilaterally. He has recently had his left side treated, is here today for the right side.  Given his progressive symptoms we would like to offer a  left-sided ulnar nerve decompression.    Thank you for involving me in the care of this patient.    [name] MD/MSCR Neurosurgery - Peripheral Nerve Surgery

## 2023-01-20 NOTE — H&P (View-Only) (Signed)
Referring Physician:  No referring provider defined for this encounter.  Primary Physician:  Armando Gang, FNP  History of Present Illness: 01/20/2023 Joshua [name] is here today with a chief complaint of bilateral ulnar neuropathy.  He has been followed for cervical myelopathy and radiculopathy.  He comes in today for his findings of bilateral severe ulnar neuropathy.  He had his left side recently treated, here today for right side. Has been dropping things, has noted decreased ability to do things like laces and do buttons.  He feels like this is getting progressively worse.  He does improve with steroid administration.    Review of Systems:  A 10 point review of systems is negative, except for the pertinent positives and negatives detailed in the HPI.  Past Medical History: Past Medical History:  Diagnosis Date   Allergy    Anemia    Anxiety    Cervical fusion syndrome    Cervical radicular pain    Cocaine abuse (HCC)    last used in 2007   Cubital tunnel syndrome    Depression    Iron deficiency anemia    Marijuana use    Multiple sclerosis (HCC)    Neck pain    Subdural hematoma (HCC)     Past Surgical History: Past Surgical History:  Procedure Laterality Date   ANTERIOR CERVICAL DECOMP/DISCECTOMY FUSION N/A 02/20/2020   Procedure: ANTERIOR CERVICAL DECOMPRESSION/DISCECTOMY FUSION 1 LEVEL C3-4;  Surgeon: Venetia Night, MD;  Location: [facility] ORS;  Service: Neurosurgery;  Laterality: N/A;   CERVICAL FUSION  2014   C4-7   COLONOSCOPY N/A 05/24/2022   Procedure: COLONOSCOPY;  Surgeon: Jaynie Collins, DO;  Location: [facility] ENDOSCOPY;  Service: Gastroenterology;  Laterality: N/A;   COLONOSCOPY N/A 06/10/2022   Procedure: COLONOSCOPY;  Surgeon: Jaynie Collins, DO;  Location: [facility] ENDOSCOPY;  Service: Gastroenterology;  Laterality: N/A;   CRANIOPLASTY  2005   subdural hematoma  that had to be removed   LEFT CUBITAL TUNNEL DECOMPRESSION Left  12/07/2022    Allergies: Allergies as of 12/24/2022 - Review Complete 12/20/2022  Allergen Reaction Noted   Penicillins Hives 02/12/2015    Medications:  Current Facility-Administered Medications:    ceFAZolin (ANCEF) IVPB 2g/100 mL premix, 2 g, Intravenous, 60 min Pre-Op, [name], MD   lactated ringers infusion, , Intravenous, Continuous, Lenard Simmer, MD, Last Rate: 10 mL/hr at 01/20/23 0654, New Bag at 01/20/23 0654  Social History: Social History   Tobacco Use   Smoking status: Former    Current packs/day: 0.00    Average packs/day: 1 pack/day for 20.0 years (20.0 ttl pk-yrs)    Types: Cigarettes    Quit date: 10/21/2022    Years since quitting: 0.2   Smokeless tobacco: Never   Tobacco comments:    uses Chantix , also smoke marijuana  Vaping Use   Vaping status: Never Used  Substance Use Topics   Alcohol use: Yes    Comment: rare shot   Drug use: Not Currently    Types: Marijuana, Cocaine    Comment: last used cocaine in 2007    Family Medical History: Family History  Problem Relation Age of Onset   Hypertension Mother    Hypertension Father    Multiple sclerosis Sister    Aneurysm Brother     Physical Examination: Vitals:   01/20/23 0624  BP: 124/80  Pulse: 81  Resp: 20  Temp: 97.6 F (36.4 C)  SpO2: 98%    General: Patient  is in no apparent distress. Attention to examination is appropriate.  Neck:   Supple.  Full range of motion.  Respiratory: Patient is breathing without any difficulty.   NEUROLOGICAL:     Awake, alert, oriented to person, place, and time.  Speech is clear and fluent.   Cranial Nerves: Pupils equal round and reactive to light.  Facial tone is symmetric. Shoulder shrug is symmetric. Tongue protrusion is midline.  There is no pronator drift.  Motor Exam:  Motor examination demonstrates intrinsic hand wasting that is very mild, left worse than right.  On physical examination he does show some Wartenberg's sign on  the left when compared to the right.  He has weakness in his ulnar lumbricals of approximately 4- out of 5, 4- out of 5 intrinsic finger function.  His grip is 5 - to 4+.  On the right side he is 4+ in the ulnar intrinsic musculature. He He has decreased sensation in his hands.  He has positive Tinel sign at the bilateral elbows.  Does not demonstrate snapping or subluxation of his ulnar nerves bilaterally on physical examination today.  Medical Decision Making  Imaging: Narrative & Impression  CLINICAL DATA:  Chronic neck pain.  History of multiple sclerosis   EXAM: MRI CERVICAL SPINE WITHOUT CONTRAST   TECHNIQUE: Multiplanar, multisequence MR imaging of the cervical spine was performed. No intravenous contrast was administered.   COMPARISON:  MRI 07/05/2021   FINDINGS: Technical Note: Despite efforts by the technologist and patient, motion artifact is present on today's exam and could not be eliminated. This reduces exam sensitivity and specificity.   Alignment: Straightening of the cervical lordosis. Trace anterolisthesis at C7-T1.   Vertebrae: Prior C3-4 and C6-7 ACDF. Additional interbody fusion at C4-5 where there is solid arthrodesis. No fracture. No evidence of discitis. No marrow replacing bone lesion.   Cord: Redemonstration of T2 hyperintense lesions within the cord at the C2 and C5 levels. Additional known lesion at the T1-T2 level is not well seen on today's motion degraded examination. The axial images do not extend caudally to that location. No new site of focal cervical cord signal abnormality.   Posterior Fossa, vertebral arteries, paraspinal tissues: Negative.   Disc levels:   C2-C3: Shallow central disc protrusion. No foraminal or canal stenosis. Unchanged.   C3-C4: Prior ACDF. No significant foraminal or canal stenosis. Unchanged.   C4-C5: Prior interbody fusion. No significant canal stenosis. Mild bilateral foraminal narrowing. Unchanged.    C5-C6: Endplate and uncovertebral spurring contribute to mild-moderate bilateral foraminal stenosis. No significant canal stenosis. Unchanged.   C6-C7: Prior ACDF. No canal stenosis. Mild bilateral foraminal stenosis. Unchanged.   C7-T1: Left-sided facet arthropathy. No significant foraminal or canal stenosis. Unchanged.   IMPRESSION: 1. Redemonstration of T2 hyperintense lesions within the cord at the C2 and C5 levels consistent with known multiple sclerosis. Additional known lesion at the T1-T2 level is not well seen on today's motion degraded examination. No new site of focal cervical cord signal abnormality. 2. Mild-moderate bilateral foraminal stenosis at C5-6. Mild bilateral foraminal stenosis at C4-5 and C6-7. No canal stenosis at any level.     Electronically Signed   By: Duanne Guess D.O.   On: 10/13/2022 16:22    Electrodiagnostics: Evaluation of his outside testing demonstrates severe ulnar neuropathy bilaterally at the elbows.  I have personally reviewed the images and electrodiagnostics and agree with the above interpretation.  Assessment and Plan: Mr. Oberg is a pleasant 52 y.o. 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  He also has a history of multiple sclerosis.  On physical examination today he demonstrates intrinsic hand weakness especially in the ulnar distribution on the left worse than the right but also with weakness on the right.  He has slightly decreased sensation in the ulnar distribution but does have a history of cervical myeloradiculopathy which makes a focal examination of the peripheral nerve structure more difficult.  He often wakes with complete numbness in tingling in his ulnar distribution.  His EMG demonstrates severe cubital tunnel bilaterally. He has recently had his left side treated, is here today for the right side.  Given his progressive symptoms we would like to offer a  left-sided ulnar nerve decompression.    Thank you for involving me in the care of this patient.    [name] MD/MSCR Neurosurgery - Peripheral Nerve Surgery

## 2023-01-20 NOTE — Discharge Instructions (Addendum)
  Your surgeon has performed an operation on your arm to relieve the pressure on your nerve.. Many times, patients feel better immediately after surgery and can "overdo it." Even if you feel well, it is important that you follow these activity guidelines.  If you do not let your nerve heal well I can cause a recurrence of your symptoms.  The following are instructions to help in your recovery once you have been discharged from the hospital.  * It is ok to take NSAIDs after surgery.  Activity    Avoid lifting objects heavier than 10 pounds (gallon milk jug).  Where possible, avoid household activities that involve lifting, bending, pushing, or pulling such as laundry, vacuuming, grocery shopping, and childcare. Try to arrange for help from friends and family for these activities while your back heals.  Increase physical activity slowly as tolerated.  Taking short walks is encouraged, but avoid strenuous exercise. Do not jog, run, bicycle, lift weights, or participate in any other exercises unless specifically allowed by your doctor. Avoid prolonged sitting, including car rides.  You should not drive until no longer taking any pain medication.  You may shower three days after your surgery.  After showering, lightly dab your incision dry. Do not take a tub bath or go swimming for 3 weeks, or until approved by your doctor at your follow-up appointment.  If you smoke, we strongly recommend that you quit.  Smoking has been proven to interfere with normal healing in your back and will dramatically reduce the success rate of your surgery. Please contact [facility] (800-QUIT-NOW) and use the resources at www.[facility].com for assistance in stopping smoking.  Surgical Incision   If you have a dressing on your incision, you may remove it three days after your surgery. Keep your incision area clean and dry.  You do not have stitches or staples that need to be removed.  The sutures are underneath your skin  and covered by glue.. The steri-strips/glue should begin to peel away within about a week (it is fine if the steri-strips fall off before then).  Diet            You may return to your usual diet. Be sure to stay hydrated.  When to Contact us  Although your surgery and recovery will likely be uneventful, you may have some residual numbness, aches, and pains in your back and/or legs. This is normal and should improve in the next few weeks.  However, should you experience any of the following, contact us immediately: New numbness or weakness Pain that is progressively getting worse, and is not relieved by your pain medications or rest Bleeding, redness, swelling, pain, or drainage from surgical incision Chills or flu-like symptoms Fever greater than 101.0 F (38.3 C) Problems with bowel or bladder functions Difficulty breathing or shortness of breath Warmth, tenderness, or swelling in your calf  Contact Information How to contact us:  If you have any questions/concerns before or after surgery, you can reach [place] at [phone], or you can send a mychart message. We can be reached by phone or mychart 8am-4pm, Monday-Friday.  *Please note: Calls after 4pm are forwarded to a third party answering service. Mychart messages are not routinely monitored during evenings, weekends, and holidays. Please call our office to contact the answering service for urgent concerns during non-business hours.

## 2023-01-21 ENCOUNTER — Telehealth: Payer: Self-pay | Admitting: Neurosurgery

## 2023-01-21 DIAGNOSIS — G5621 Lesion of ulnar nerve, right upper limb: Secondary | ICD-10-CM

## 2023-01-21 MED ORDER — OXYCODONE HCL 5 MG PO TABS: 5.0000 mg | ORAL_TABLET | ORAL | 0 refills | Status: AC | PRN

## 2023-01-21 MED ORDER — OXYCODONE HCL 5 MG PO TABS: 5.0000 mg | ORAL_TABLET | ORAL | 0 refills | Status: DC | PRN

## 2023-01-21 NOTE — Telephone Encounter (Signed)
I spoke with Mr Voet. He reports pain in his elbow, numbness in his 3rd-5th digits. He is taking hydrocodone, but he doesn't notice any difference/relief. He states that this hurts much worse than his other arm did. He does not have any new weakness.  He is not taking any muscle relaxers or tylenol/ibuprofen. He takes 80mg  of prednisone every other day.  Medical [employer]

## 2023-01-21 NOTE — Telephone Encounter (Signed)
Sent in

## 2023-01-21 NOTE — Telephone Encounter (Addendum)
Dr Katrinka Blazing recommends ice, NSAIDS, and elevating his arm. He will send an rx for oxycodone for him to take instead of hydrocodone. I notified the patient of this.

## 2023-01-21 NOTE — Anesthesia Postprocedure Evaluation (Signed)
Anesthesia Post Note  Patient: Joshua [name]  Procedure(s) Performed: RIGHT CUBITAL TUNNEL DECOMPRESSION (Right: Elbow)  Patient location during evaluation: PACU Anesthesia Type: General Level of consciousness: awake and alert Pain management: pain level controlled Vital Signs Assessment: post-procedure vital signs reviewed and stable Respiratory status: spontaneous breathing, nonlabored ventilation and respiratory function stable Cardiovascular status: blood pressure returned to baseline and stable Postop Assessment: no apparent nausea or vomiting Anesthetic complications: no   No notable events documented.   Last Vitals:  Vitals:   01/20/23 0838 01/20/23 0853  BP: 112/79 113/78  Pulse: 76 83  Resp: 14 15  Temp: 37.1 C 36.7 C  SpO2: 98% 100%    Last Pain:  Vitals:   01/21/23 0904  TempSrc:   PainSc: 5                  Foye Deer

## 2023-01-21 NOTE — Telephone Encounter (Signed)
Patient called stating that his elbow is hurting extremely bad after surgery. He said his left elbow did not hurt this bad last surgery. Also mentioned he would like to know why the honeycomb dressing was used instead of the same dressing he had the last time ("clear dressing") ?   Also states he is taking hyrdocodone- could possible need a stronger dosage. Please advise

## 2023-01-31 ENCOUNTER — Other Ambulatory Visit: Payer: Self-pay

## 2023-01-31 NOTE — Progress Notes (Addendum)
   REFERRING PHYSICIAN:  Donal Channing SQUIBB, [facility] [address] [place],  [place] [address]  DOS: 01/20/23 right ulnar nerve decompression at the elbow  DOS: 12/07/22 left ulnar nerve decompression at the elbow   HISTORY OF PRESENT ILLNESS: Joshua [name] is 2 weeks status post above surgery. Given norco after above surgery and called with increased pain on 01/21/23. Oxycodone  was sent to the pharmacy (5 day supply).   He still has numbness in last 3 fingers on right side. He also notes some swelling at medial elbow that is very tender to the touch.   He has intermittent numbness/tingling in left hand.   He is taking prn oxycodone . He does not need a refill.   He takes prednisone  80mg  every other day.    PHYSICAL EXAMINATION:  NEUROLOGICAL:  General: In no acute distress.   Awake, alert, oriented to person, place, and time.  Pupils equal round and reactive to light.    Leb elbow incision c/d/I, no signs of infection. He has some very mild medial swelling with tenderness.   He has good ROM of his left arm with no gross weakness noted.   Incision well healed on right side. Good ROM of right elbow with no gross weakness.   Imaging:  Nothing new to review.   Assessment / Plan: Joshua [name] is doing reasonable s/p above surgery. Treatment options reviewed with patient and following plan made:    - Reviewed wound care.  - He can return to activity as tolerated.  - Discussed that tenderness/sensitivity at left elbow should improve. Will review with Dr. Claudene on Monday and message him with further recommendations.  - Follow up with Dr. Claudene as scheduled.   Advised to contact the office if any questions or concerns arise.   ADDENDUM 02/07/23:  Patient reviewed with Dr. Claudene. He agrees that above symptoms should improve with time. He is on prednisone  80mg  every other day and this should help with inflammation. Message sent to patient.  He will keep his scheduled follow up.    Glade Boys PA-C Dept of Neurosurgery

## 2023-02-01 ENCOUNTER — Other Ambulatory Visit: Payer: 59

## 2023-02-03 ENCOUNTER — Ambulatory Visit: Admission: RE | Admit: 2023-02-03 | Payer: 59 | Source: Ambulatory Visit

## 2023-02-03 ENCOUNTER — Ambulatory Visit (INDEPENDENT_AMBULATORY_CARE_PROVIDER_SITE_OTHER): Payer: 59 | Admitting: Orthopedic Surgery

## 2023-02-03 ENCOUNTER — Ambulatory Visit: Payer: 59

## 2023-02-03 ENCOUNTER — Encounter: Payer: Self-pay | Admitting: Orthopedic Surgery

## 2023-02-03 VITALS — BP 126/78 | Ht 67.0 in | Wt 168.0 lb

## 2023-02-03 DIAGNOSIS — G5621 Lesion of ulnar nerve, right upper limb: Secondary | ICD-10-CM

## 2023-02-03 DIAGNOSIS — Z9889 Other specified postprocedural states: Secondary | ICD-10-CM

## 2023-02-08 ENCOUNTER — Ambulatory Visit: Payer: 59 | Admitting: Neurology

## 2023-02-10 ENCOUNTER — Telehealth: Payer: Self-pay | Admitting: Neurology

## 2023-02-10 ENCOUNTER — Other Ambulatory Visit: Payer: Self-pay | Admitting: Neurology

## 2023-02-10 ENCOUNTER — Encounter: Payer: Self-pay | Admitting: Neurology

## 2023-02-10 DIAGNOSIS — G35 Multiple sclerosis: Secondary | ICD-10-CM

## 2023-02-10 DIAGNOSIS — F419 Anxiety disorder, unspecified: Secondary | ICD-10-CM

## 2023-02-10 MED ORDER — LORAZEPAM 1 MG PO TABS: ORAL_TABLET | ORAL | 0 refills | Status: DC

## 2023-02-10 NOTE — Telephone Encounter (Signed)
 Called pt and informed him of Dr [facility] answer. He understood, wanted to change he appointment with Dr. Skeet because his MRI are scheduled after it, I told him to talk to scheduling and see what they have open. He wife will be taking him for the MRI appointment.

## 2023-02-10 NOTE — Telephone Encounter (Signed)
 Left message with the after hour service 02-10-23 at 12:15 pm   Caller states that he has aMRI and needs something to help him relax

## 2023-02-14 ENCOUNTER — Ambulatory Visit
Admission: RE | Admit: 2023-02-14 | Discharge: 2023-02-14 | Disposition: A | Discharge status: Home / Self Care | Payer: 59 | Source: Ambulatory Visit | Attending: Neurology | Admitting: Neurology

## 2023-02-14 DIAGNOSIS — G35 Multiple sclerosis: Secondary | ICD-10-CM

## 2023-02-14 MED ORDER — GADOPICLENOL 0.5 MMOL/ML IV SOLN
7.5000 mL | Freq: Once | INTRAVENOUS | Status: AC | PRN
  Administered 2023-02-14: 7.5 mL via INTRAVENOUS

## 2023-02-15 ENCOUNTER — Other Ambulatory Visit: Payer: Self-pay | Admitting: Neurology

## 2023-02-18 NOTE — Progress Notes (Deleted)
NEUROLOGY FOLLOW UP OFFICE NOTE  Joshua [name] [id number]  Assessment/Plan:   Multiple sclerosis Cervical fusion syndrome Chronic pain syndrome Spinal myoclonus Chronic fatigue     DMT:  Ocrevus Check CBC with diff, CMP, quantitative immunoglobulin panel and vit D today and in 6 months. Repeat MRI of brain and C-spine with and without contrast in 6 months. Nortriptyline 50mg  QHS for pain Modafinil 100mg  daily for fatigue D3 4000 units daily Follow up 6 months.   Subjective:  Joshua [name] is a 53 year old left-handed African American male s/p C5-7 and C3-4 ACDF who follows up for multiple sclerosis.    UPDATE: Current DMT:  Ocrevus. Other medications:  modafinil 100mg  daily, nortriptyline 50mg  QHS D3 4000 IU daily Prednisone 20mg  BID  B12 level from July was 406.    Vision:  No issues Motor:  No weakness.  Sometimes his legs may jerk.  Also body jerks as well. Sensory:  Paresthesias involving all extremities.  Pain:  Chronic diffuse pain.  Chronic neck pain s/p ACDF.  No improvement in pain despite surgery.  Chronic back pain.  Chronic chest pain.   Gait:  Unsteady gait.   Bowel/Bladder:  No issues Fatigue:  Daytime fatigue.  Poor sleep Cognition:  Reports short term memory deficits.  He forgets things to happened the previous day.  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  Mood:  Depressed.  Trouble sleeping. Erectile dysfunction   HISTORY:  Initially presented with hand numbness and diffuse pain.  MRI of brain and cervical cord at the time revealed numerous periventricular and cervical spinal cord lesions.  He did not follow up with neurology and continued to experience diffuse pain, arm and hand numbness, unsteady gait with falls and "MS hug".   Other pertinent history: 2005 Subdural hematoma: "spontaneous bleed" in setting of cocaine use and high blood pressure s/p surgery on the left skull region.  Multiple  cervical spine surgeries with chronic neck pain.  07/12/2013 ACDF C4-5 and C6-7.  Headaches resolved.  Still with neck pain. ACDF C3-C4 on 02/20/2020. Right ulnar neuropathy on NCV-EMG 05/27/2014. History of alcoholism and drug addiction.   Family History:  Sister (diagnosed with MS in her 87s)  Past disease modifying therapy:  Tecfidera (reports that it didn't work) Past medications:  Cymbalta 30mg  daily; gabapentin, Lyrica, Flexeril, paroxetine  Prednisone - 20mg  BID for pain by PCP   Imaging: 11/13/2009 MRI BRAIN W WO:  Multiple periventricular, deep and subcortical white matter lesions, including characteristic perpendicular lesion adjacent to the right ventricle. 11/13/2009 MRI CERVICAL & THORACIC SPINE W WO:  T2/STIR hyperintense lesions in the cervical medullary junction, C5, T1-2, T7-8, and T9  vertebral levels without evidence of enhancement.  Multilevel degenerative changes of the cervical spine. 07/07/2010 MRI CERVICAL SPINE W WO (personally reviewed):  Nonenhancing lesions at C1 and C5 levels. 01/04/2012 MRI BRAIN W WO (personally reviewed):  Two tiny nonspecific nonenhancing hyperintense foci in subcortical white matter. 05/17/2013 MRI CERVICAL SPINE WO:  Multiple nonenhancing lesions within spinal cord, reportedly stable compared to imaging from 07/07/2010.  Spondylosis with right paracentral disc herniation without neural compression at C3-4, broad based disc protrusion with bilateral neural foraminal stenosis compressing C7 nerve roots at C6-7, and left-sided facet arthropathy with edema at C7-T1 09/11/2013 MRI CERVICAL SPINE WO:  S/p ACDF at C4-5 and C6-7, increased disc herniation and spinal stenosis at C3-4, chronjic left facet arthropathy at C7-T1. 06/07/2014 MRI BRAIN W WO:  Multiple T2 hyperintense lesions within periventricular and subcortical white matter without abnormal enhancement. 06/07/2014 MRI BRAIN W WO: Multiple T2 hyperintense peripheral cervical spinal cord lesions from the  level of the C1 arch to the T2 level without abnormal enhancement.  Status post C4-C7 ACDF.  Small disc bulge at C3-C4 resulting in mild spinal canal stenosis.  02/13/2019 MRI BRAIN W WO:  1. Mild patchy T2/FLAIR signal abnormality involving the periventricular and juxta cortical supratentorial cerebral white matter, consistent with multiple sclerosis. Overall, appearance is mildly progressed relative to 2013. No evidence for active demyelination.  2. Underlying mildly progressed cerebral atrophy for age.  3. Chronic right maxillary sinusitis. 02/13/2019 MRI CERVICAL SPINE W WO:  1. Patchy multifocal cord signal abnormality involving the cervical spinal cord as above, consistent with history of multiple sclerosis.  Overall, appearance is minimally progressed relative to 2015. No evidence for active demyelination.  2. Prior ACDF at C4 through C7 without residual stenosis.  3. Adjacent segment disease with central disc protrusion at C3-4 indenting the ventral thecal sac with resultant moderate spinal stenosis and moderate right and mild left C4 foraminal stenosis. 10/20/2019 MRI BRAIN W WO: Stable 10/20/2019 MRI CERVICAL SPINE W WO:  Stable.  Moderate spinal stenosis at C3-4.  No spinal stenosis at fused C5-7 levels.  Small disc central disc protrusion indenting ventral thecal sac at C2-3 without significant stenosis.   06/16/2020 MRI C-SPINE W WO:  Chronic demyelinating lesions C2 and C5, consistent with history of multiple sclerosis. No new lesions.  Prior C3-C5 and C6-C7 ACDF. Unchanged adjacent segment disease at C5-C6 with moderate bilateral neuroforaminal stenosis. 09/27/2020 MRI BRAIN W WO:  1. No significant interval change in distribution and number of chronic demyelinating lesions involving the supratentorial cerebral white matter. No new lesions or evidence for active demyelination. 09/27/2020 MRI C-SPINE W WO:  1. Chronic demyelinating lesions at C2, C5, and within the upper thoracic cord,  stable. No new lesions to suggest disease progression or active demyelination.  2. Prior ACDF at C3-C5 and C6-7. Adjacent segment disease at C5-6 with mild left greater than right C5 foraminal narrowing, stable. 07/06/2021 MRI BRAIN & C-SPINE W WO:  Unchanged lesions involving the cerebral white matter and cervical spinal cord consistent with multiple sclerosis. No evidence of active demyelination.   Labs: 04/22/2014 positive JC Virus Ab with index 1.28 Reportedly underwent lumbar puncture for CSF analysis, revealing 10 oligoclonal bands.  PAST MEDICAL HISTORY: Past Medical History:  Diagnosis Date   Allergy    Anemia    Anxiety    Cervical fusion syndrome    Cervical radicular pain    Cocaine abuse (HCC)    last used in 2007   Cubital tunnel syndrome    Depression    Iron deficiency anemia    Marijuana use    Multiple sclerosis (HCC)    Neck pain    Subdural hematoma (HCC)     MEDICATIONS: Current Outpatient Medications on File Prior to Visit  Medication Sig Dispense Refill   cyclobenzaprine (FLEXERIL) 10 MG tablet Take 1 tablet (10 mg total) by mouth 3 (three) times daily as needed for muscle spasms. This can make you sleepy. 60 tablet 0   LORazepam (ATIVAN) 1 MG tablet Take 1 tablet (1 mg) prior to leaving for MRI, may repeat prior to MRI if needed. 2 tablet 0   megestrol (MEGACE) 40 MG tablet Take 40 mg by mouth in the morning.     modafinil (PROVIGIL) 100 MG tablet TAKE 1  TABLET BY MOUTH EVERY MORNING 30 tablet 2   PARoxetine (PAXIL) 10 MG tablet Take 10 mg by mouth daily.     predniSONE (DELTASONE) 20 MG tablet Take 80 mg by mouth every other day.     sildenafil (VIAGRA) 100 MG tablet Take 100 mg by mouth daily as needed for erectile dysfunction.     tadalafil (CIALIS) 5 MG tablet Take 5 mg by mouth daily.     tamsulosin (FLOMAX) 0.4 MG CAPS capsule Take 0.4 mg by mouth in the morning.     tiZANidine (ZANAFLEX) 4 MG tablet Take 4 mg by mouth 2 (two) times daily as needed  for muscle spasms.     traZODone (DESYREL) 150 MG tablet Take 150-300 mg by mouth at bedtime as needed for sleep.     No current facility-administered medications on file prior to visit.    ALLERGIES: Allergies  Allergen Reactions   Penicillins Hives    Childhood reaction     FAMILY HISTORY: Family History  Problem Relation Age of Onset   Hypertension Mother    Hypertension Father    Multiple sclerosis Sister    Aneurysm Brother       Objective:  *** General: No acute distress.  Patient appears well-groomed.   Head:  Normocephalic/atraumatic Eyes:  Fundi examined but not visualized Neck: supple, no paraspinal tenderness, full range of motion *** Heart:  Regular rate and rhythm Back: No paraspinal tenderness *** Neurological Exam: Alert and oriented.  Speech fluent and not dysarthric.  Language intact.  CN II-XII intact.  Tone slightly increased.  Muscle strength 5/5 throughout.  Sensation to light touch intact.  Deep tendon reflexes 2+ throughout except 3+ in knees.  Finger to nose testing intact.  Slight broad-based gait.  Romberg with sway. ***   Shon Millet, DO  CC: Franco Nones, FNP

## 2023-02-21 ENCOUNTER — Ambulatory Visit: Payer: 59 | Admitting: Neurology

## 2023-02-25 ENCOUNTER — Other Ambulatory Visit: Payer: Self-pay | Admitting: Neurology

## 2023-02-25 ENCOUNTER — Telehealth: Payer: Self-pay | Admitting: Neurology

## 2023-02-25 MED ORDER — DIAZEPAM 5 MG PO TABS: ORAL_TABLET | ORAL | 0 refills | Status: DC

## 2023-02-25 NOTE — Telephone Encounter (Signed)
I left message that Rx has been sent in

## 2023-02-25 NOTE — Telephone Encounter (Signed)
Caller stated he has MRI next week and he needs medication so he's able to sit still during MRI. Didn't exactly know name of medication but stated Joshua [name] has scribe it to him before   Pharmacy is [facility] - [place], [place] - [address]

## 2023-02-28 ENCOUNTER — Encounter: Payer: 59 | Admitting: Orthopedic Surgery

## 2023-02-28 ENCOUNTER — Telehealth: Payer: Self-pay

## 2023-02-28 DIAGNOSIS — G35 Multiple sclerosis: Secondary | ICD-10-CM

## 2023-02-28 NOTE — Telephone Encounter (Signed)
ERROr

## 2023-03-01 ENCOUNTER — Ambulatory Visit
Admission: RE | Admit: 2023-03-01 | Discharge: 2023-03-01 | Disposition: A | Discharge status: Home / Self Care | Payer: 59 | Source: Ambulatory Visit | Attending: Neurology | Admitting: Neurology

## 2023-03-01 ENCOUNTER — Encounter: Payer: Self-pay | Admitting: Oncology

## 2023-03-01 DIAGNOSIS — G35 Multiple sclerosis: Secondary | ICD-10-CM

## 2023-03-01 MED ORDER — GADOPICLENOL 0.5 MMOL/ML IV SOLN
7.5000 mL | Freq: Once | INTRAVENOUS | Status: AC | PRN
  Administered 2023-03-01: 7.5 mL via INTRAVENOUS

## 2023-03-02 ENCOUNTER — Encounter: Payer: Self-pay | Admitting: Neurosurgery

## 2023-03-02 ENCOUNTER — Ambulatory Visit (INDEPENDENT_AMBULATORY_CARE_PROVIDER_SITE_OTHER): Payer: 59 | Admitting: Neurosurgery

## 2023-03-02 VITALS — BP 112/66 | Ht 67.0 in | Wt 168.0 lb

## 2023-03-02 DIAGNOSIS — G5621 Lesion of ulnar nerve, right upper limb: Secondary | ICD-10-CM

## 2023-03-02 DIAGNOSIS — Z9889 Other specified postprocedural states: Secondary | ICD-10-CM | POA: Insufficient documentation

## 2023-03-02 DIAGNOSIS — Z09 Encounter for follow-up examination after completed treatment for conditions other than malignant neoplasm: Secondary | ICD-10-CM

## 2023-03-02 NOTE — Progress Notes (Signed)
   REFERRING PHYSICIAN:  Armando Gang, Fnp [address] [place],  [place] [address]  DOS: 01/20/23 right ulnar nerve decompression at the elbow  DOS: 12/07/22 left ulnar nerve decompression at the elbow   HISTORY OF PRESENT ILLNESS: ROLLA KEDZIERSKI is 6 weeks status post above surgery.  Overall he states that the pain going down to his fingers has improved significantly.  He does continue to have tender points on the medial aspect of the elbows on both sides.  He notices mostly when he lays it on something he will get some radiation of his pain in the lateral aspect of his forearms.  He states that his major concern at this point is cervical and paraspinal pain.  He gets significant pain in his neck and bilateral shoulders.  This radiates in between his scapulas.  He has had multiple previous cervical surgeries.  Currently not having significant amount of control at that time.  PHYSICAL EXAMINATION:  NEUROLOGICAL:  General: In no acute distress.   Awake, alert, oriented to person, place, and time.  Pupils equal round and reactive to light.    Leb elbow incision c/d/I, no signs of infection.  He does have area of tenderness on the medial aspect of both elbows  He has good ROM of his left arm with no gross weakness noted.   Incision well healed on right side. Good ROM of right elbow with no gross weakness.   Imaging:  Nothing new to review.   Assessment / Plan: BLADEN UMAR is doing reasonable s/p above surgery. Treatment options reviewed with patient and following plan made:    -Overall he continues to recover from his bilateral ulnar nerve decompression.  States that his pain has improved significantly since his initial rebound pain immediately postop.  He does continue to have tender points on the medial aspects of both elbows, we let him know that this may continue to persist.  He states it is tolerable -He is currently being worked up by his neurologist for any possible  MS flares.  He got a repeat cervical spine MRI which did not show any active ongoing compression.  Will plan to follow-up on the final read. -In regards to his chronic neck pain as well as pain radiating into his neck bilateral scapula paraspinal area and in the bilateral shoulders to his arms will reach out to Dr. Myer Haff to see whether or not he wants [place] to move forward with possible evaluation for cervical spinal cord stimulator.  [name], MD Dept of Neurosurgery

## 2023-03-17 ENCOUNTER — Telehealth: Payer: Self-pay | Admitting: Neurosurgery

## 2023-03-17 NOTE — Telephone Encounter (Signed)
[name], MD  P [facility]Neurosurgery Admin Can you put him on my clinic list for a phone call to start a SCS cervical stim workup       Previous Messages    ----- Message ----- From: Venetia Night, MD Sent: 03/03/2023   8:31 AM EST To: Drake Leach, PA-C; [name], MD Subject: RE: Follow-up                                  Sounds good to me ----- Message ----- From: [name], MD Sent: 03/02/2023   3:11 PM EST To: Drake Leach, PA-C; Venetia Night, MD Subject: Follow-up                                      Hi team,  Followed up with Mr. Mish, he is improvement in pain from his elbows down to his arms.  Neurologically stable.  At this point his continued complaint is mostly in the neck and parascapular areas radiating to his shoulders.  It does not look like he has any ongoing compression.  He want me to work him up for a possible cervical spinal cord stimulator to see whether or not we can get him some more relief.  Let me know what you think, Apolinar Junes  Comments  Rowe Pavy 03/17/2023 09:11 AM EST  "Call cannot be completed at this time."  ------------------------------------  Judieth Keens D 03/10/2023 01:52 PM EST  "Call cannot be completed at this time"  ------------------------------------  Rowe Pavy 03/07/2023 09:17 AM EST  "Call cannot be completed at this time."  ------------------------------------  Judieth Keens D 03/03/2023 10:51 AM EST  "Call cannot be completed"- sent mychart message

## 2023-03-23 NOTE — Progress Notes (Unsigned)
 Virtual Visit via Video Note  Consent was obtained for video visit:  Yes.   Answered questions that patient had about telehealth interaction:  Yes.   I discussed the limitations, risks, security and privacy concerns of performing an evaluation and management service by telemedicine. I also discussed with the patient that there may be a patient responsible charge related to this service. The patient expressed understanding and agreed to proceed.  Pt location: Home Physician Location: office Name of referring provider:  Armando Gang, FNP I connected with Joshua [name] at patients initiation/request on 03/24/2023 at 11:10 AM EST by video enabled telemedicine application and verified that I am speaking with the correct person using two identifiers. Pt MRN:  [id number] Pt DOB:  February 16, 1970 Video Participants:  Joshua [name]   Assessment/Plan:   Multiple sclerosis Cervical fusion syndrome Chronic pain syndrome Spinal myoclonus Chronic fatigue     DMT:  Ocrevus Check CBC with diff, CMP, quantitative immunoglobulin panel and vit D (will come by next week) and in 6 months. Increase modafinil to 200mg  every morning. Follow up 6 months.   Subjective:  Joshua [name] is a 53 year old left-handed African American male s/p C5-7 and C3-4 ACDF who follows up for multiple sclerosis. MRIs personally reviewed.   UPDATE: Current DMT:  Ocrevus.  Last infusion a month ago. Other medications:  modafinil 100mg  daily, paroxetine 10mg  daily, sildenafil, tadalafil, tizanidine 4mg  BID, Flexeril 10mg  TID PRN, trazodone 150-300mg  at bedtime PRN. Megace Not taking D3.    03/01/2023 MRI BRAIN & C-SPINE W WO:  Redemonstrated findings of demyelinating disease in the brain and cervical spine, unchanged from prior exam. No evidence of active demyelination 02/14/2023 MRI T-SPINE W WO:  1. Subtle T2 hyperintense signal within the thoracic cord at the T2 level, T8-T9, and T9-T10 levels, compatible with  the provided history of multiple sclerosis. No abnormal postcontrast enhancement to suggest active demyelination. 2. Shallow noncompressive disc protrusions at T5-T6, T6-T7, and T8-T9. No foraminal or canal stenosis at any level.    Vision:  No issues Motor:  No weakness.  Spinal myoclonus.  Sometimes his legs may jerk, particularly when he gets wakes up in the morning.  Also body jerks as well. Sensory:  Paresthesias involving all extremities.  Pain:  Chronic diffuse pain but overall improved..  Chronic neck pain s/p ACDF.  No improvement in pain despite surgery.  Chronic back pain.  Chronic chest pain.   Gait:  Unsteady gait.   Bowel/Bladder:  No issues Fatigue:  Excessive fatigue, especially since stopping prednisone. Cognition:  Reports short term memory deficits.  He forgets things to happened the previous day.  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  Mood:  Depressed.  Trouble sleeping. Erectile dysfunction   HISTORY:  Initially presented with hand numbness and diffuse pain.  MRI of brain and cervical cord at the time revealed numerous periventricular and cervical spinal cord lesions.  He did not follow up with neurology and continued to experience diffuse pain, arm and hand numbness, unsteady gait with falls and "MS hug".   Other pertinent history: 2005 Subdural hematoma: "spontaneous bleed" in setting of cocaine use and high blood pressure s/p surgery on the left skull region.  Multiple cervical spine surgeries with chronic neck pain.  07/12/2013 ACDF C4-5 and C6-7.  Headaches resolved.  Still with neck pain. ACDF C3-C4 on 02/20/2020. Right ulnar neuropathy on NCV-EMG 05/27/2014. History of alcoholism and drug addiction.  Family History:  Sister (diagnosed with MS in her 17s)  Past disease modifying therapy:  Tecfidera (reports that it didn't work) Past medications:  Cymbalta 30mg  daily; gabapentin, Lyrica, Flexeril, nortriptyline 50mg  at  bedtime, prednisone    Imaging: 11/13/2009 MRI BRAIN W WO:  Multiple periventricular, deep and subcortical white matter lesions, including characteristic perpendicular lesion adjacent to the right ventricle. 11/13/2009 MRI CERVICAL & THORACIC SPINE W WO:  T2/STIR hyperintense lesions in the cervical medullary junction, C5, T1-2, T7-8, and T9  vertebral levels without evidence of enhancement.  Multilevel degenerative changes of the cervical spine. 07/07/2010 MRI CERVICAL SPINE W WO:  Nonenhancing lesions at C1 and C5 levels. 01/04/2012 MRI BRAIN W WO:  Two tiny nonspecific nonenhancing hyperintense foci in subcortical white matter. 05/17/2013 MRI CERVICAL SPINE WO:  Multiple nonenhancing lesions within spinal cord, reportedly stable compared to imaging from 07/07/2010.  Spondylosis with right paracentral disc herniation without neural compression at C3-4, broad based disc protrusion with bilateral neural foraminal stenosis compressing C7 nerve roots at C6-7, and left-sided facet arthropathy with edema at C7-T1 09/11/2013 MRI CERVICAL SPINE WO:  S/p ACDF at C4-5 and C6-7, increased disc herniation and spinal stenosis at C3-4, chronjic left facet arthropathy at C7-T1. 06/07/2014 MRI BRAIN W WO: Multiple T2 hyperintense lesions within periventricular and subcortical white matter without abnormal enhancement. 06/07/2014 MRI BRAIN W WO: Multiple T2 hyperintense peripheral cervical spinal cord lesions from the level of the C1 arch to the T2 level without abnormal enhancement.  Status post C4-C7 ACDF.  Small disc bulge at C3-C4 resulting in mild spinal canal stenosis.  02/13/2019 MRI BRAIN W WO:  1. Mild patchy T2/FLAIR signal abnormality involving the periventricular and juxta cortical supratentorial cerebral white matter, consistent with multiple sclerosis. Overall, appearance is mildly progressed relative to 2013. No evidence for active demyelination.  2. Underlying mildly progressed cerebral atrophy for age.  3.  Chronic right maxillary sinusitis. 02/13/2019 MRI CERVICAL SPINE W WO:  1. Patchy multifocal cord signal abnormality involving the cervical spinal cord as above, consistent with history of multiple sclerosis.  Overall, appearance is minimally progressed relative to 2015. No evidence for active demyelination.  2. Prior ACDF at C4 through C7 without residual stenosis.  3. Adjacent segment disease with central disc protrusion at C3-4 indenting the ventral thecal sac with resultant moderate spinal stenosis and moderate right and mild left C4 foraminal stenosis. 10/20/2019 MRI BRAIN W WO: Stable 10/20/2019 MRI CERVICAL SPINE W WO:  Stable.  Moderate spinal stenosis at C3-4.  No spinal stenosis at fused C5-7 levels.  Small disc central disc protrusion indenting ventral thecal sac at C2-3 without significant stenosis.   06/16/2020 MRI C-SPINE W WO:  Chronic demyelinating lesions C2 and C5, consistent with history of multiple sclerosis. No new lesions.  Prior C3-C5 and C6-C7 ACDF. Unchanged adjacent segment disease at C5-C6 with moderate bilateral neuroforaminal stenosis. 09/27/2020 MRI BRAIN W WO:  1. No significant interval change in distribution and number of chronic demyelinating lesions involving the supratentorial cerebral white matter. No new lesions or evidence for active demyelination. 09/27/2020 MRI C-SPINE W WO:  1. Chronic demyelinating lesions at C2, C5, and within the upper thoracic cord, stable. No new lesions to suggest disease progression or active demyelination.  2. Prior ACDF at C3-C5 and C6-7. Adjacent segment disease at C5-6 with mild left greater than right C5 foraminal narrowing, stable. 07/06/2021 MRI BRAIN & C-SPINE W WO:  Unchanged lesions involving the cerebral white matter and cervical spinal cord consistent with  multiple sclerosis. No evidence of active demyelination.   Labs: 04/22/2014 positive JC Virus Ab with index 1.28 Reportedly underwent lumbar puncture for CSF analysis,  revealing 10 oligoclonal bands.  Past Medical History: Past Medical History:  Diagnosis Date   Allergy    Anemia    Anxiety    Cervical fusion syndrome    Cervical radicular pain    Cocaine abuse (HCC)    last used in 2007   Cubital tunnel syndrome    Depression    Iron deficiency anemia    Marijuana use    Multiple sclerosis (HCC)    Neck pain    Subdural hematoma (HCC)     Medications: Outpatient Encounter Medications as of 03/24/2023  Medication Sig   cyclobenzaprine (FLEXERIL) 10 MG tablet Take 1 tablet (10 mg total) by mouth 3 (three) times daily as needed for muscle spasms. This can make you sleepy.   diazepam (VALIUM) 5 MG tablet take 1 tablet 30 mins prior to MRI. May take second dose if needed.   folic acid (FOLVITE) 1 MG tablet Take 1 mg by mouth daily.   LORazepam (ATIVAN) 1 MG tablet Take 1 tablet (1 mg) prior to leaving for MRI, may repeat prior to MRI if needed.   megestrol (MEGACE) 40 MG tablet Take 40 mg by mouth in the morning.   modafinil (PROVIGIL) 100 MG tablet TAKE 1 TABLET BY MOUTH EVERY MORNING   PARoxetine (PAXIL) 10 MG tablet Take 10 mg by mouth daily.   predniSONE (DELTASONE) 20 MG tablet Take 80 mg by mouth every other day.   sildenafil (VIAGRA) 100 MG tablet Take 100 mg by mouth daily as needed for erectile dysfunction.   tadalafil (CIALIS) 5 MG tablet Take 5 mg by mouth daily.   tamsulosin (FLOMAX) 0.4 MG CAPS capsule Take 0.4 mg by mouth in the morning.   tiZANidine (ZANAFLEX) 4 MG tablet Take 4 mg by mouth 2 (two) times daily as needed for muscle spasms.   traZODone (DESYREL) 150 MG tablet Take 150-300 mg by mouth at bedtime as needed for sleep.   No facility-administered encounter medications on file as of 03/24/2023.    Allergies: Allergies  Allergen Reactions   Penicillins Hives    Childhood reaction     Family History: Family History  Problem Relation Age of Onset   Hypertension Mother    Hypertension Father    Multiple sclerosis  Sister    Aneurysm Brother     Observations/Objective:   No acute distress.  Alert and oriented.  Speech fluent and not dysarthric.  Language intact.     Follow Up Instructions:    -I discussed the assessment and treatment plan with the patient. The patient was provided an opportunity to ask questions and all were answered. The patient agreed with the plan and demonstrated an understanding of the instructions.   The patient was advised to call back or seek an in-person evaluation if the symptoms worsen or if the condition fails to improve as anticipated.   Cira Servant, DO   CC: Franco Nones, FNP

## 2023-03-24 ENCOUNTER — Encounter: Payer: Self-pay | Admitting: Neurology

## 2023-03-24 ENCOUNTER — Telehealth (INDEPENDENT_AMBULATORY_CARE_PROVIDER_SITE_OTHER): Payer: 59 | Admitting: Neurology

## 2023-03-24 DIAGNOSIS — G253 Myoclonus: Secondary | ICD-10-CM | POA: Diagnosis not present

## 2023-03-24 DIAGNOSIS — G35 Multiple sclerosis: Secondary | ICD-10-CM | POA: Diagnosis not present

## 2023-03-24 DIAGNOSIS — Q761 Klippel-Feil syndrome: Secondary | ICD-10-CM

## 2023-03-24 MED ORDER — MODAFINIL 200 MG PO TABS: 200.0000 mg | ORAL_TABLET | Freq: Every morning | ORAL | 5 refills | Status: AC

## 2023-03-24 NOTE — Addendum Note (Signed)
 Addended by: Leida Lauth on: 03/24/2023 12:45 PM   Modules accepted: Orders

## 2023-04-06 NOTE — Progress Notes (Deleted)
   REFERRING PHYSICIAN:  Armando Gang, Fnp [address] [place],  [place] [address]  DOS: 01/20/23 right ulnar nerve decompression at the elbow  DOS: 12/07/22 left ulnar nerve decompression at the elbow   HISTORY OF PRESENT ILLNESS:  He was doing better at his last visit with improvement in pain into his fingers, but he continued with tenderness at both medial elbows.   Set up phone visit with Dr. Katrinka Blazing to discuss cervical SCS?***   He still has numbness in last 3 fingers on right side. He also notes some swelling at medial elbow that is very tender to the touch.   He has intermittent numbness/tingling in left hand.   He is taking prn oxycodone. He does not need a refill.   He takes prednisone 80mg  every other day.    PHYSICAL EXAMINATION:  NEUROLOGICAL:  General: In no acute distress.   Awake, alert, oriented to person, place, and time.  Pupils equal round and reactive to light.    Leb elbow incision c/d/I, no signs of infection. He has some very mild medial swelling with tenderness. ***  He has good ROM of his left arm with no gross weakness noted.   Incision well healed on right side. Good ROM of right elbow with no gross weakness.   Imaging:  Nothing new to review.   Assessment / Plan: ORAN DILLENBURG is doing reasonable s/p above surgery. Treatment options reviewed with patient and following plan made:    - Reviewed wound care.  - He can return to activity as tolerated.  - Discussed that tenderness/sensitivity at left elbow should improve. Will review with Dr. Katrinka Blazing on Monday and message him with further recommendations.  - Follow up with Dr. Katrinka Blazing as scheduled.   Advised to contact the office if any questions or concerns arise.   Drake Leach PA-C Dept of Neurosurgery

## 2023-04-11 ENCOUNTER — Encounter: Payer: 59 | Admitting: Orthopedic Surgery

## 2023-04-12 ENCOUNTER — Encounter: Payer: 59 | Admitting: Orthopedic Surgery

## 2023-04-12 ENCOUNTER — Telehealth: Payer: Self-pay | Admitting: Orthopedic Surgery

## 2023-04-12 NOTE — Telephone Encounter (Signed)
 He had appt with me today that he did not show up for, but it looks like Dr. Katrinka Blazing wanted to do a phone visit with him discuss cervical SCS.   He was not able to be reached. Can we please try to call him again to get this scheduled?   Thanks!

## 2023-04-15 NOTE — Telephone Encounter (Signed)
 Patient wanted to come in-person, scheduled.

## 2023-04-25 ENCOUNTER — Ambulatory Visit (INDEPENDENT_AMBULATORY_CARE_PROVIDER_SITE_OTHER): Admitting: Neurosurgery

## 2023-04-25 VITALS — BP 128/78 | Ht 67.0 in | Wt 168.0 lb

## 2023-04-25 DIAGNOSIS — G5621 Lesion of ulnar nerve, right upper limb: Secondary | ICD-10-CM

## 2023-04-25 DIAGNOSIS — M542 Cervicalgia: Secondary | ICD-10-CM | POA: Diagnosis not present

## 2023-04-25 DIAGNOSIS — Z981 Arthrodesis status: Secondary | ICD-10-CM | POA: Diagnosis not present

## 2023-04-25 DIAGNOSIS — M5412 Radiculopathy, cervical region: Secondary | ICD-10-CM

## 2023-04-25 DIAGNOSIS — G5622 Lesion of ulnar nerve, left upper limb: Secondary | ICD-10-CM

## 2023-04-25 DIAGNOSIS — G894 Chronic pain syndrome: Secondary | ICD-10-CM

## 2023-04-25 NOTE — Patient Instructions (Addendum)
 [facility] - televisits [phone] Not in network with [facility] 2024 [facility] - televisits [phone] Not in network with [facility]24 [facility] in [place] Provider: Jacelyn Pi, [place] [phone] Accepts [facility] Dr Kieth Brightly in [place] [phone] Accepts [facility], but is typically booked out about 10 months Dr Orie Fisherman in [place] - also does televisits [phone] Dr Irish Lack in [place] [phone] Dr Mindi Slicker in [place] - also does televisits [phone] [facility] (offices in [place], [place], and [place]) [phone]   Please let us know which one of the above psychologist's you would like to see for evaluation prior to the spinal cord stimulator trial. These are the only providers we are aware of that perform this type of evaluation. Once we fax the referral, please call them to set up an appointment (they do not typically call you).                                     LOCAL PHYSICAL THERAPY  [facility]  [address]  [place], [place] [address]  [phone]   [facility]  [address] [place], [place] [address]  [phone]   [facility] (2 locations)  [address]  [place], [place] [address]  [phone]          or  [address]  [place], [place] [address]  [phone]   [facility]  [address]  [place], [place] [address]  [phone]  **dry needling**   [facility]  [address]  [place], [place] [address]  [phone]  Fax: [phone]  ** Aquatic therapy[address] [place], [place] [address] [phone] **Aquatic therapy**  [facility] [address] [place], [place]  [address] [phone]  [facility]  [address]  [place], [place] [address]  [phone]   [facility]  [address]  [place], [place] [address]  [phone]   [facility]  [address]   Janora Norlander  [place], [place] [address]  [phone]   [facility]  [address]  [place], [place] [address]  [phone]  **dry needling**   PELVIC FLOOR/SI JOINT  [facility]  Mariane Masters, PT  [email or web address]   [place]   [facility]  [address]  [address] [place], [place] [address]  [phone]   [facility] [place]  [address], [place] [address]  [phone]   [facility] [address] [place], [place] [address]  Octavio Manns, [place]   [facility]  Raymond Gurney, PT  [address]  [place], [place] [address] [phone]   Samara Deist   [facility]  [address]  [phone]   [facility]  [address]  [phone]   [facility]  Annamaria Boots [facility]  [address]  [place], [place] [address]  [phone]  **No Aetna or medicaid**   [facility]  [phone] [address], [place] [address]  [phone]   [facility]  [address]  [place], [place] [address]  [phone]   Dannial Monarch, DC  [address], [place] [address]  [phone]   [facility]  [address], [place] [address]  [phone]   [facility]  [address]  [place], [place] [address]  [phone]  [facility]  (3 locations)  [address]  [place], [place] [address]  [phone]  **dry needling**           or  [address] [place], [place] [address]   [phone]  **Additionally has Gloris Manchester, OT**           or  [address]  [place], [place] [address]  [phone]  **Pediatric therapy**  [facility]  [address]  [phone]  **dry needlingVerdie [facility]  [address]  [place], [place] [address]  [phone]  [facility]   (Inside [address] Fitness)  [address]  [place], [place] [address]  [phone]  **dry needling**  **MEDICAID or UNINSURED** [facility] dept. Of Physical Therapy [place], [place] [address] [phone]  [facility]  [address] [place], [place] [address]  [phone]   [facility]  [address]  [place], [place] [address]  [phone]   [facility]  [address] [place], [place] [address]  [phone]   [facility]  [address]  [place], [place] [address]  [phone]   [facility]  [address] [place], [place] [address]  [phone]   [facility]  [address]  [place], [place] [address]  [phone]   AQUATIC   Kathalene Frames [facility]   [place] [facility]  [facility]  [place]   [place]  *Residents only*  [facility] [employer]  *[facility]  Exercise class  [facility]  Exercise class   [facility]  [address]  [place], [place]   [phone]   [facility]  [address]  [place], [place] [address]  [phone]   [place], [place]  [facility]  [address]  [phone]   [facility]  [facility]  [address]  [phone]           or  [address]  [phone]   [facility]   Provides education and support and practical  information for coping with arthritis for arthritis sufferers and their families.   When: 12:15 - 1:30 p.m. the second Monday of each month, March through December  Info: Call Rehabilitation Services at [phone]

## 2023-04-25 NOTE — Progress Notes (Unsigned)
      REFERRING PHYSICIAN:  Armando Gang, Fnp [address] [place],  [place] [address]  DOS: 01/20/23 right ulnar nerve decompression at the elbow  DOS: 12/07/22 left ulnar nerve decompression at the elbow   HISTORY OF PRESENT ILLNESS: Joshua [name] is well-known to the practice.  He has had previous bilateral cubital tunnel surgeries as well as previous cervical decompression and fusion.  He is here today to discuss cervical spinal cord stimulator.  He has not yet went to his psychology appointment.  Will plan on making another referral.  Will also have him set up for physical therapy as he has not had recent physical therapy on his neck.  Continues to have paraspinal/parascapular neck pain  PHYSICAL EXAMINATION:  NEUROLOGICAL:  General: In no acute distress.   Awake, alert, oriented to person, place, and time.  Pupils equal round and reactive to light.    Leb elbow incision c/d/I, no signs of infection.  He does have area of tenderness on the medial aspect of both elbows  He has good ROM of his left arm with no gross weakness noted.   Incision well healed on right side. Good ROM of right elbow with no gross weakness.   Imaging:  Nothing new to review.   Assessment / Plan: FARD BORUNDA is doing reasonable s/p above surgery he is currently seeing me today regarding his prior cervical fusion with continued neck pain and bilateral scapular/periscapular pain and consideration for a possible cervical spinal stimulator.  He has not yet had his psychology evaluation nor is he had recent PT.  Have made referrals to both so we can workup for a possible spinal cord stimulator trial.  He will continue to follow-up.  Once he has had both of these appointments we will plan on discussing a spinal cord stimulator trial if he does not have full recovery with his physical therapy   [name], MD Dept of Neurosurgery  I spent a total of 30 minutes on the visit today.  Reviewing  his previous charts, going over his current imaging, face-to-face evaluation, and coordination of his care going forward, and documentation

## 2023-07-11 ENCOUNTER — Ambulatory Visit (INDEPENDENT_AMBULATORY_CARE_PROVIDER_SITE_OTHER): Admitting: Neurosurgery

## 2023-07-11 ENCOUNTER — Encounter: Payer: Self-pay | Admitting: Neurosurgery

## 2023-07-11 VITALS — BP 98/62 | Ht 67.0 in | Wt 168.0 lb

## 2023-07-11 DIAGNOSIS — G894 Chronic pain syndrome: Secondary | ICD-10-CM

## 2023-07-11 NOTE — Progress Notes (Signed)
      REFERRING PHYSICIAN:  Sharyne Degree, Fnp [address] [place],  [place] [address]  DOS: 01/20/23 right ulnar nerve decompression at the elbow  DOS: 12/07/22 left ulnar nerve decompression at the elbow   HISTORY OF PRESENT ILLNESS: Joshua [name] is well-known to the practice.  He has had previous bilateral cubital tunnel surgeries as well as previous cervical decompression and fusion.  We had a long discussion with him about the requirements for surgery.  He has had 3 physical therapy appointments to this date, we let him know that he need to continue with his physical therapy to move forward.  He plans to reach out to them today.   PHYSICAL EXAMINATION:  NEUROLOGICAL:  General: In no acute distress.   Awake, alert, oriented to person, place, and time.  Pupils equal round and reactive to light.    Leb elbow incision c/d/I, no signs of infection.    He has good ROM of his left arm with no gross weakness noted.   Incision well healed on right side. Good ROM of right elbow with no gross weakness.   Imaging:  Nothing new to review.   Assessment / Plan: Joshua [name] is doing well after his bilateral ulnar nerve decompressions.  We have been evaluating him for possible cervical spinal cord stimulator as he has continued chronic neuropathic pain.  He has been sent for psychology evaluation as well as physical therapy.  He has had 3 sessions of physical therapy.  He is reaching back out to the physical therapist to get reinstated in the program for more care prior to discussing moving forward with a final cord stimulator.  Will plan to see him back after has had a full regimen of physical therapy.   Carroll Clamp, MD Dept of Neurosurgery  I spent a total of 30 minutes on the visit today.  Reviewing his previous charts, going over his current imaging, face-to-face evaluation, and coordination of his care going forward, and documentation

## 2023-08-08 ENCOUNTER — Encounter: Payer: Self-pay | Admitting: Neurosurgery

## 2023-08-08 ENCOUNTER — Ambulatory Visit (INDEPENDENT_AMBULATORY_CARE_PROVIDER_SITE_OTHER): Admitting: Neurosurgery

## 2023-08-08 VITALS — BP 108/72 | Ht 67.0 in | Wt 168.0 lb

## 2023-08-08 DIAGNOSIS — M546 Pain in thoracic spine: Secondary | ICD-10-CM | POA: Diagnosis not present

## 2023-08-08 DIAGNOSIS — M542 Cervicalgia: Secondary | ICD-10-CM

## 2023-08-08 DIAGNOSIS — Z981 Arthrodesis status: Secondary | ICD-10-CM

## 2023-08-08 DIAGNOSIS — M545 Low back pain, unspecified: Secondary | ICD-10-CM | POA: Diagnosis not present

## 2023-08-08 DIAGNOSIS — G894 Chronic pain syndrome: Secondary | ICD-10-CM

## 2023-08-08 NOTE — Progress Notes (Signed)
 We had a follow-up clinic visit with Mr. Maciolek.  He came to the office for an in person evaluation.  He has recently been discharged from physical therapy.  We discussed his ongoing pain, he states this is mostly midline and axial.  He has not had recent facet injections or any branch blocking.  He states that majority of his pain is midline and that he is not having any radiating pain into his arms or shoulders.  Given these symptoms I would like to have him evaluated 1 more time by pain medicine.  We discussed possible cervical spinal stimulator.  I like them to see whether or not they think he might be a good candidate for this.  I did discuss that the stimulators are best for appendicular pain and that they are less suited for central spinal pain.  He was hoping that this would give him a significant relief in his midline upper thoracic mid back pain and neck pain.  I would like him to see our pain team to ensure that he does not have any other options available him prior to stimulation.  Spent a total of 15 minutes with the patient in clinic today going over his physical therapy, reviewing his x-rays, coordinating his care going forward.

## 2023-08-30 ENCOUNTER — Telehealth: Payer: Self-pay | Admitting: Neurology

## 2023-08-30 NOTE — Telephone Encounter (Signed)
 Vertell called and LM w/AN. [facility] is [facility]. They are having a hard time getting in touch with the pt regarding delivery of a medication. Pt is due for infusion but can't get a hold of him. Nurse is not having success either Call liz [phone]

## 2023-08-30 NOTE — Telephone Encounter (Signed)
 Patient advised of note above. Per patient he nor his wife have received a call.   Vertell phone number given.

## 2023-09-23 NOTE — Progress Notes (Signed)
 NEUROLOGY FOLLOW UP OFFICE NOTE  Joshua [name] [id number]  Assessment/Plan:   Multiple sclerosis Cervical fusion syndrome Chronic pain syndrome Spinal myoclonus Chronic fatigue     DMT:  Due to ongoing negative symptoms, plan to change from Ocrevus to Briumvi Check CBC with diff, IgG and vit D today and in 6 months. Change from modafinil  to armodafinil  150mg  every morning. Repeat MRI of brain and cervical spine with and without contrast after first infusion of Briumvi. Follow up 6 months.   Subjective:  Joshua [name] is a 53 year old left-handed African American male s/p C5-7 and C3-4 ACDF who follows up for multiple sclerosis. MRIs personally reviewed.   UPDATE: Current DMT:  Ocrevus.  Last infusion a month ago. Other medications:  modafinil  200mg  daily, paroxetine 10mg  daily, sildenafil, Megace.  Currently not taking vitamin D .    Has not had repeat labs performed.     Vision:  No issues Motor:  No weakness.  Spinal myoclonus.  Sometimes his legs may jerk, particularly when he gets wakes up in the morning.  Also body jerks as well. Sensory:  Paresthesias involving all extremities.  Pain:  Chronic diffuse pain but overall improved..  Chronic neck pain s/p ACDF.  No improvement in pain despite surgery.  Chronic back pain.  Chronic chest pain.   Gait:  Unsteady gait.   Bowel/Bladder:  No issues Fatigue:  Endorses excessive fatigue despite modafinil  200mg .   Cognition:  Reports short term memory deficits.  He forgets things to happened the previous day.  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  Mood:  Depressed.  Trouble sleeping. Erectile dysfunction   HISTORY:  Initially presented with hand numbness and diffuse pain.  MRI of brain and cervical cord at the time revealed numerous periventricular and cervical spinal cord lesions.  He did not follow up with neurology and continued to experience diffuse pain, arm and hand  numbness, unsteady gait with falls and MS hug.   Other pertinent history: 2005 Subdural hematoma: spontaneous bleed in setting of cocaine use and high blood pressure s/p surgery on the left skull region.  Multiple cervical spine surgeries with chronic neck pain.  07/12/2013 ACDF C4-5 and C6-7.  Headaches resolved.  Still with neck pain. ACDF C3-C4 on 02/20/2020. Right ulnar neuropathy on NCV-EMG 05/27/2014. History of alcoholism and drug addiction.   Family History:  Sister (diagnosed with MS in her 77s)  Past disease modifying therapy:  Tecfidera (reports that it didn't work) Past medications:  Cymbalta 30mg  daily; gabapentin, Lyrica , nortriptyline  50mg  at bedtime, prednisone , tadalafil, tizanidine  4mg  BID, Flexeril  10mg  TID PRN, trazodone 150-300mg  at bedtime PRN.     Imaging: 11/13/2009 MRI BRAIN W WO:  Multiple periventricular, deep and subcortical white matter lesions, including characteristic perpendicular lesion adjacent to the right ventricle. 11/13/2009 MRI CERVICAL & THORACIC SPINE W WO:  T2/STIR hyperintense lesions in the cervical medullary junction, C5, T1-2, T7-8, and T9  vertebral levels without evidence of enhancement.  Multilevel degenerative changes of the cervical spine. 07/07/2010 MRI CERVICAL SPINE W WO:  Nonenhancing lesions at C1 and C5 levels. 01/04/2012 MRI BRAIN W WO:  Two tiny nonspecific nonenhancing hyperintense foci in subcortical white matter. 05/17/2013 MRI CERVICAL SPINE WO:  Multiple nonenhancing lesions within spinal cord, reportedly stable compared to imaging from 07/07/2010.  Spondylosis with right paracentral disc herniation without neural compression at C3-4, broad based disc protrusion with bilateral neural foraminal stenosis compressing C7 nerve roots at  C6-7, and left-sided facet arthropathy with edema at C7-T1 09/11/2013 MRI CERVICAL SPINE WO:  S/p ACDF at C4-5 and C6-7, increased disc herniation and spinal stenosis at C3-4, chronjic left facet arthropathy  at C7-T1. 06/07/2014 MRI BRAIN W WO: Multiple T2 hyperintense lesions within periventricular and subcortical white matter without abnormal enhancement. 06/07/2014 MRI BRAIN W WO: Multiple T2 hyperintense peripheral cervical spinal cord lesions from the level of the C1 arch to the T2 level without abnormal enhancement.  Status post C4-C7 ACDF.  Small disc bulge at C3-C4 resulting in mild spinal canal stenosis.  02/13/2019 MRI BRAIN W WO:  1. Mild patchy T2/FLAIR signal abnormality involving the periventricular and juxta cortical supratentorial cerebral white matter, consistent with multiple sclerosis. Overall, appearance is mildly progressed relative to 2013. No evidence for active demyelination.  2. Underlying mildly progressed cerebral atrophy for age.  3. Chronic right maxillary sinusitis. 02/13/2019 MRI CERVICAL SPINE W WO:  1. Patchy multifocal cord signal abnormality involving the cervical spinal cord as above, consistent with history of multiple sclerosis.  Overall, appearance is minimally progressed relative to 2015. No evidence for active demyelination.  2. Prior ACDF at C4 through C7 without residual stenosis.  3. Adjacent segment disease with central disc protrusion at C3-4 indenting the ventral thecal sac with resultant moderate spinal stenosis and moderate right and mild left C4 foraminal stenosis. 10/20/2019 MRI BRAIN W WO: Stable 10/20/2019 MRI CERVICAL SPINE W WO:  Stable.  Moderate spinal stenosis at C3-4.  No spinal stenosis at fused C5-7 levels.  Small disc central disc protrusion indenting ventral thecal sac at C2-3 without significant stenosis.   06/16/2020 MRI C-SPINE W WO:  Chronic demyelinating lesions C2 and C5, consistent with history of multiple sclerosis. No new lesions.  Prior C3-C5 and C6-C7 ACDF. Unchanged adjacent segment disease at C5-C6 with moderate bilateral neuroforaminal stenosis. 09/27/2020 MRI BRAIN W WO:  1. No significant interval change in distribution and number of  chronic demyelinating lesions involving the supratentorial cerebral white matter. No new lesions or evidence for active demyelination. 09/27/2020 MRI C-SPINE W WO:  1. Chronic demyelinating lesions at C2, C5, and within the upper thoracic cord, stable. No new lesions to suggest disease progression or active demyelination.  2. Prior ACDF at C3-C5 and C6-7. Adjacent segment disease at C5-6 with mild left greater than right C5 foraminal narrowing, stable. 07/06/2021 MRI BRAIN & C-SPINE W WO:  Unchanged lesions involving the cerebral white matter and cervical spinal cord consistent with multiple sclerosis. No evidence of active demyelination. 03/01/2023 MRI BRAIN & C-SPINE W WO:  Redemonstrated findings of demyelinating disease in the brain and cervical spine, unchanged from prior exam. No evidence of active demyelination 02/14/2023 MRI T-SPINE W WO:  1. Subtle T2 hyperintense signal within the thoracic cord at the T2 level, T8-T9, and T9-T10 levels, compatible with the provided history of multiple sclerosis. No abnormal postcontrast enhancement to suggest active demyelination. 2. Shallow noncompressive disc protrusions at T5-T6, T6-T7, and T8-T9. No foraminal or canal stenosis at any level.   Labs: 04/22/2014 positive JC Virus Ab with index 1.28 Reportedly underwent lumbar puncture for CSF analysis, revealing 10 oligoclonal bands.  PAST MEDICAL HISTORY: Past Medical History:  Diagnosis Date   Allergy    Anemia    Anxiety    Cervical fusion syndrome    Cervical radicular pain    Cocaine abuse (HCC)    last used in 2007   Cubital tunnel syndrome    Depression    Iron  deficiency anemia  Marijuana use    Multiple sclerosis (HCC)    Neck pain    Subdural hematoma (HCC)     MEDICATIONS: Current Outpatient Medications on File Prior to Visit  Medication Sig Dispense Refill   cyclobenzaprine  (FLEXERIL ) 10 MG tablet Take 1 tablet (10 mg total) by mouth 3 (three) times daily as needed for muscle  spasms. This can make you sleepy. 60 tablet 0   folic acid  (FOLVITE ) 1 MG tablet Take 1 mg by mouth daily.     megestrol (MEGACE) 40 MG tablet Take 40 mg by mouth in the morning.     modafinil  (PROVIGIL ) 200 MG tablet Take 1 tablet (200 mg total) by mouth in the morning. 30 tablet 5   PARoxetine (PAXIL) 10 MG tablet Take 10 mg by mouth daily.     sildenafil (VIAGRA) 100 MG tablet Take 100 mg by mouth daily as needed for erectile dysfunction.     tamsulosin (FLOMAX) 0.4 MG CAPS capsule Take 0.4 mg by mouth in the morning.     No current facility-administered medications on file prior to visit.    ALLERGIES: Allergies  Allergen Reactions   Penicillins Hives    Childhood reaction     FAMILY HISTORY: Family History  Problem Relation Age of Onset   Hypertension Mother    Hypertension Father    Multiple sclerosis Sister    Aneurysm Brother       Objective:  Blood pressure 106/73, pulse 78, height 5' 7 (1.702 m), weight 155 lb (70.3 kg), SpO2 98%. General: No acute distress.  Patient appears well-groomed.   Head:  Normocephalic/atraumatic Eyes:  Fundi examined but not visualized Neck: supple, paraspinal tenderness, full range of motion Heart:  Regular rate and rhythm Neurological Exam: alert and oriented.  Speech fluent and not dysarthric, language intact.  CN II-XII intact. Bulk normal, slight increased tone, muscle strength 5/5 throughout.  Sensation to light touch intact.  Deep tendon reflexes 2+ throughout except 3+ in patellar bilaterall, toes downgoing.  Finger to nose testing intact.  Gait slightly wide-based gait, Romberg with sway   Juliene Dunnings, DO  CC: Channing Schaffer, FNP

## 2023-09-26 ENCOUNTER — Ambulatory Visit (INDEPENDENT_AMBULATORY_CARE_PROVIDER_SITE_OTHER): Payer: 59 | Admitting: Neurology

## 2023-09-26 ENCOUNTER — Other Ambulatory Visit

## 2023-09-26 ENCOUNTER — Encounter: Payer: Self-pay | Admitting: Neurology

## 2023-09-26 VITALS — BP 106/73 | HR 78 | Ht 67.0 in | Wt 155.0 lb

## 2023-09-26 DIAGNOSIS — G35 Multiple sclerosis: Secondary | ICD-10-CM | POA: Diagnosis not present

## 2023-09-26 DIAGNOSIS — Q761 Klippel-Feil syndrome: Secondary | ICD-10-CM

## 2023-09-26 MED ORDER — ARMODAFINIL 150 MG PO TABS
150.0000 mg | ORAL_TABLET | Freq: Every morning | ORAL | 5 refills | Status: AC
Start: 2023-09-26 — End: ?

## 2023-09-26 NOTE — Patient Instructions (Addendum)
 Plan to switch from Ocrevus to Briumvi every 6 months Plan to switch from modafinil  to armodafinil  for excessive daytime sleepiness Check MRI of brain and cervical spine with and without contrast right after first dose of Briumvi. We have sent a referral to [facility] for your MRI and they will call you directly to schedule your appointment. They are located at [address] [facility]. If you need to contact them directly please call [phone].  Check labs today - CBC with diff, IgG, vit D Follow up 6 months.

## 2023-09-27 LAB — CBC WITH DIFFERENTIAL/PLATELET
Absolute Lymphocytes: 2478 {cells}/uL (ref 850–3900)
Absolute Monocytes: 731 {cells}/uL (ref 200–950)
Basophils Absolute: 50 {cells}/uL (ref 0–200)
Basophils Relative: 0.6 %
Eosinophils Absolute: 210 {cells}/uL (ref 15–500)
Eosinophils Relative: 2.5 %
HCT: 40.6 % (ref 38.5–50.0)
Hemoglobin: 13.5 g/dL (ref 13.2–17.1)
MCH: 29.5 pg (ref 27.0–33.0)
MCHC: 33.3 g/dL (ref 32.0–36.0)
MCV: 88.8 fL (ref 80.0–100.0)
MPV: 8.6 fL (ref 7.5–12.5)
Monocytes Relative: 8.7 %
Neutro Abs: 4931 {cells}/uL (ref 1500–7800)
Neutrophils Relative %: 58.7 %
Platelets: 333 Thousand/uL (ref 140–400)
RBC: 4.57 Million/uL (ref 4.20–5.80)
RDW: 12.4 % (ref 11.0–15.0)
Total Lymphocyte: 29.5 %
WBC: 8.4 Thousand/uL (ref 3.8–10.8)

## 2023-09-27 LAB — IGG: IgG (Immunoglobin G), Serum: 1202 mg/dL (ref 600–1640)

## 2023-09-27 LAB — VITAMIN D 25 HYDROXY (VIT D DEFICIENCY, FRACTURES): Vit D, 25-Hydroxy: 16 ng/mL — ABNORMAL LOW (ref 30–100)

## 2023-09-28 ENCOUNTER — Ambulatory Visit: Payer: Self-pay | Admitting: Neurology

## 2023-09-28 MED ORDER — VITAMIN D (ERGOCALCIFEROL) 1.25 MG (50000 UNIT) PO CAPS: ORAL_CAPSULE | ORAL | 0 refills | Status: AC

## 2023-09-28 NOTE — Progress Notes (Signed)
 Patient advised.

## 2024-01-10 ENCOUNTER — Encounter: Payer: Self-pay | Admitting: Neurology

## 2024-01-16 NOTE — Progress Notes (Unsigned)
° ° ° ° °  REFERRING PHYSICIAN:  Donal Channing SQUIBB, F[facility] [address] [place],  [place] [address]  DOS: 01/20/23 right ulnar nerve decompression at the elbow  DOS: 12/07/22 left ulnar nerve decompression at the elbow   Discussed the use of AI scribe software for clinical note transcription with the patient, who gave verbal consent to proceed.  History of Present Illness Joshua [name] is a 53 year old male with multiple sclerosis who presents with neck and arm pain.  He has persistent neck pain that disrupts his sleep and requires frequent position changes for partial relief.  He has chronic numbness and severe pain in the right hand, worse when bending the elbow, with episodes where the hand goes dead. Symptoms have been present since before prior ulnar neuropathy surgery and are severe in any arm position, so he keeps the elbow in a specific position to limit worsening.  He has long-standing strong pain along the medial aspect of the arm from the axilla to the elbow, with constant tingling under both arms and hands that feel persistently tingly, numb, and cold.  He underwent ulnar neuropathy surgery in the past and had nerve conduction testing at an orthopedic clinic about four years ago, but symptoms have persisted.  He recently started new infusions for multiple sclerosis and feels worse afterward, with several days of depressed mood and no perceived neurologic benefit. Numbness and tingling in the hands and arms are persistent without recent change aside from neck-pain-related sleep disruption.    PHYSICAL EXAMINATION:  NEUROLOGICAL:  General: In no acute distress.   Awake, alert, oriented to person, place, and time.  Pupils equal round and reactive to light.    He has good ROM of his left arm with no gross weakness noted.   Incision well healed on right side. Good ROM of right elbow with no gross weakness.   Imaging:  Nothing new to review.   Assessment and  Plan Assessment & Plan Cervical spine pain and possible instability status post cervical spinal fusion Chronic cervical spine pain with possible instability post cervical spinal fusion. Persistent neck pain with difficulty sleeping due to neck positioning. Concerns about potential instability at the fusion site, particularly at the top level. Differential includes instability or incomplete fusion. - Ordered flexion x-rays to assess for cervical spine instability. - Will discuss potential for cervical spinal stimulator if instability is ruled out.  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 Symptoms include numbness and tingling in the arms, particularly under the arms and down to the elbows, exacerbated by certain positions. Previous ulnar neuropathy surgery noted, but symptoms persist. Differential includes ongoing nerve compression or incomplete recovery from previous surgery. - Ordered repeat EMG to assess nerve function in both arms. - Referred to neurology for further evaluation and management.  Penne MICAEL Sharps, MD Dept of Neurosurgery

## 2024-01-18 ENCOUNTER — Ambulatory Visit

## 2024-01-18 ENCOUNTER — Ambulatory Visit: Admitting: Neurosurgery

## 2024-01-18 ENCOUNTER — Encounter: Payer: Self-pay | Admitting: Neurosurgery

## 2024-01-18 VITALS — BP 122/84 | Ht 67.0 in | Wt 147.0 lb

## 2024-01-18 DIAGNOSIS — Z09 Encounter for follow-up examination after completed treatment for conditions other than malignant neoplasm: Secondary | ICD-10-CM

## 2024-01-18 DIAGNOSIS — Z981 Arthrodesis status: Secondary | ICD-10-CM

## 2024-01-18 DIAGNOSIS — M542 Cervicalgia: Secondary | ICD-10-CM

## 2024-01-18 DIAGNOSIS — M5412 Radiculopathy, cervical region: Secondary | ICD-10-CM | POA: Diagnosis not present

## 2024-01-20 ENCOUNTER — Other Ambulatory Visit: Payer: Self-pay | Admitting: Neurology

## 2024-01-20 ENCOUNTER — Telehealth: Payer: Self-pay | Admitting: Neurology

## 2024-01-20 MED ORDER — DIAZEPAM 5 MG PO TABS: ORAL_TABLET | ORAL | 0 refills | Status: AC

## 2024-01-20 NOTE — Telephone Encounter (Signed)
 Patient advised,Per Rexene, Prescription for diazepam  sent. Needs a driver

## 2024-01-20 NOTE — Telephone Encounter (Signed)
 Pt would like calming Rx for MRI on Monday to [facility] - [place], [place] - [address]

## 2024-01-23 ENCOUNTER — Other Ambulatory Visit

## 2024-01-23 ENCOUNTER — Inpatient Hospital Stay: Admission: RE | Admit: 2024-01-23 | Discharge: 2024-01-23 | Attending: Neurology

## 2024-01-23 DIAGNOSIS — G35D Multiple sclerosis, unspecified: Secondary | ICD-10-CM

## 2024-01-23 MED ORDER — GADOPICLENOL 0.5 MMOL/ML IV SOLN
7.5000 mL | Freq: Once | INTRAVENOUS | Status: AC | PRN
  Administered 2024-01-23: 7.5 mL via INTRAVENOUS

## 2024-01-24 ENCOUNTER — Encounter: Payer: Self-pay | Admitting: Neurology

## 2024-01-24 ENCOUNTER — Other Ambulatory Visit: Payer: Self-pay

## 2024-01-24 DIAGNOSIS — R202 Paresthesia of skin: Secondary | ICD-10-CM

## 2024-02-06 ENCOUNTER — Ambulatory Visit: Payer: Self-pay | Admitting: Neurosurgery

## 2024-02-24 ENCOUNTER — Ambulatory Visit: Admitting: Neurology

## 2024-02-24 DIAGNOSIS — R202 Paresthesia of skin: Secondary | ICD-10-CM | POA: Diagnosis not present

## 2024-02-24 DIAGNOSIS — G5623 Lesion of ulnar nerve, bilateral upper limbs: Secondary | ICD-10-CM

## 2024-02-24 NOTE — Procedures (Signed)
 " [facility]  [address] [place], [address]  [place], [place] [address] Tel: [phone] Fax: [phone] Test Date:  02/24/2024  Patient: Joshua [name] DOB: Aug 28, 1970 Physician: Tonita Blanch, DO  Sex: Male Height: 5' 7 Ref Phys: Penne Sharps, MD  ID#: [id number]   Technician:    History: This is a 54 year old man with history of cervical fusion and bilateral ulnar decompression referred for evaluation of bilateral hand paresthesias.  NCV & EMG Findings: Extensive electrodiagnostic testing of the right upper extremity and additional studies of the left shows:  Bilateral median, mixed palmar, and left ulnar sensory responses are within normal limits.  Right ulnar sensory response shows prolonged latency (3.2 ms) and reduced amplitude (7.3 V).   Bilateral median motor responses are within normal limits.  Right ulnar motor response shows reduced amplitude (4.4 mV) and conduction block across the forearm at the abductor digiti minimi.  Right ulnar motor response at the first dorsal interosseous muscle shows normal amplitude, however, there is conduction block across the forearm.  Left ulnar motor response shows normal latency, normal amplitude, however there is conduction velocity slowing across the elbow at the abductor digiti minimi and first dorsal interosseous muscles (A Elbow-B Elbow, L42, L42 m/s).  Chronic motor axon loss changes are seen affecting the right ulnar innervated muscles, without accompanying active denervation.  These findings are not present in the left upper extremity.  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  Consider nerve imaging to further localize. Chronic left ulnar neuropathy with slowing across the elbow, mild.   ___________________________ Tonita Blanch, DO    Nerve Conduction Studies   Stim Site NR Peak (ms) Norm Peak (ms) O-P Amp  (V) Norm O-P Amp  Left Median Anti Sensory (2nd Digit)  32 C  Wrist    3.2 <3.6 24.5 >15  Right Median Anti Sensory (2nd Digit)  32 C  Wrist    3.0 <3.6 21.7 >15  Left Ulnar Anti Sensory (5th Digit)  32 C  Wrist    3.0 <3.1 11.7 >10  Right Ulnar Anti Sensory (5th Digit)  32 C  Wrist    *3.2 <3.1 *7.3 >10     Stim Site NR Onset (ms) Norm Onset (ms) O-P Amp (mV) Norm O-P Amp Site1 Site2 Delta-0 (ms) Dist (cm) Vel (m/s) Norm Vel (m/s)  Left Median Motor (Abd Poll Brev)  32 C  Wrist    2.9 <4.0 10.4 >6 Elbow Wrist 5.6 32.0 57 >50  Elbow    8.5  9.8         Right Median Motor (Abd Poll Brev)  32 C  Wrist    3.0 <4.0 10.1 >6 Elbow Wrist 5.2 30.0 58 >50  Elbow    8.2  9.6         Left Ulnar Motor (Abd Dig Minimi)  32 C  Wrist    2.3 <3.1 10.1 >7 B Elbow Wrist 4.2 21.0 50 >50  B Elbow    6.5  8.9  A Elbow B Elbow 2.4 10.0 *42 >50  A Elbow    8.9  7.5         Right Ulnar Motor (Abd Dig Minimi)  32 C  Wrist    2.8 <3.1 *4.4 >7 B Elbow Wrist 3.9 20.0 51 >50  B Elbow    6.7  2.2  A Elbow B Elbow 2.0 10.0 50 >50  A Elbow    8.7  2.2         Left Ulnar (FDI) Motor (1st DI)  32 C  Wrist    3.9 <4.5 12.4 >7 B Elbow Wrist 3.8 21.0 55 >50  B Elbow    7.7  12.5  A Elbow B Elbow 2.4 10.0 *42 >50  A Elbow    10.1  11.7         Right Ulnar (FDI) Motor (1st DI)  32 C  Wrist    4.1 <4.5 7.5 >7 B Elbow Wrist 4.2 22.0 52 >50  B Elbow    8.3  4.5  A Elbow B Elbow 1.8 10.0 56 >50  A Elbow    10.1  2.2            Stim Site NR Peak (ms) Norm Peak (ms) P-T Amp (V) Site1 Site2 Delta-P (ms) Norm Delta (ms)  Left Median/Ulnar Palm Comparison (Wrist - 8cm)  32 C  Median Palm    1.8 <2.2 33.7 Median Palm Ulnar Palm 0.1   Ulnar Palm    1.9 <2.2 10.4      Right Median/Ulnar Palm Comparison (Wrist - 8cm)  32 C  Median Palm    1.8 <2.2 37.0 Median Palm Ulnar Palm 0.0   Ulnar Palm    1.8 <2.2 7.2       Electromyography   Side Muscle Ins.Act Fibs Fasc Recrt Amp Dur Poly Activation Comment  Right  1stDorInt Nml Nml Nml *1- *1+ *1+ *1+ Nml N/A  Right Abd Poll Brev Nml Nml Nml Nml Nml Nml Nml Nml N/A  Right PronatorTeres Nml Nml Nml Nml Nml Nml Nml Nml N/A  Right Biceps Nml Nml Nml Nml Nml Nml Nml Nml N/A  Right Triceps Nml Nml Nml Nml Nml Nml Nml Nml N/A  Right Deltoid Nml Nml Nml Nml Nml Nml Nml Nml N/A  Right Abd Dig Min Nml Nml Nml *1- *1+ *1+ *1+ Nml N/A  Right FlexCarpiUln Nml Nml Nml *1- *1+ *1+ *1+ Nml N/A  Left PronatorTeres Nml Nml Nml Nml Nml Nml Nml Nml N/A  Left Biceps Nml Nml Nml Nml Nml Nml Nml Nml N/A  Left Triceps Nml Nml Nml Nml Nml Nml Nml Nml N/A  Left Deltoid Nml Nml Nml Nml Nml Nml Nml Nml N/A  Left Abd Dig Min Nml Nml Nml Nml Nml Nml Nml Nml N/A  Left FlexCarpiUln Nml Nml Nml Nml Nml Nml Nml Nml N/A  Left 1stDorInt Nml Nml Nml Nml Nml Nml Nml Nml N/A  Left Abd Poll Brev Nml Nml Nml Nml Nml Nml Nml Nml N/A      Waveforms:                             "

## 2024-02-25 ENCOUNTER — Ambulatory Visit: Payer: Self-pay | Admitting: Neurosurgery

## 2024-02-25 DIAGNOSIS — G5621 Lesion of ulnar nerve, right upper limb: Secondary | ICD-10-CM | POA: Insufficient documentation

## 2024-03-01 ENCOUNTER — Telehealth: Payer: Self-pay | Admitting: Neurosurgery

## 2024-03-01 ENCOUNTER — Other Ambulatory Visit: Payer: Self-pay | Admitting: Physician Assistant

## 2024-03-01 MED ORDER — DIAZEPAM 5 MG PO TABS: ORAL_TABLET | ORAL | 0 refills | Status: AC

## 2024-03-01 NOTE — Telephone Encounter (Signed)
 Patient is calling to request something to help him relax for his MRI that is scheduled tomorrow.   [facility] [employer]

## 2024-03-01 NOTE — Telephone Encounter (Signed)
 Patient advised

## 2024-03-02 ENCOUNTER — Ambulatory Visit: Admission: RE | Admit: 2024-03-02 | Source: Ambulatory Visit

## 2024-03-02 ENCOUNTER — Ambulatory Visit
Admission: RE | Admit: 2024-03-02 | Discharge: 2024-03-02 | Disposition: A | Source: Ambulatory Visit | Attending: Neurosurgery | Admitting: Neurosurgery

## 2024-03-02 DIAGNOSIS — G5621 Lesion of ulnar nerve, right upper limb: Secondary | ICD-10-CM | POA: Diagnosis present

## 2024-03-06 ENCOUNTER — Ambulatory Visit: Payer: Self-pay | Admitting: Neurosurgery

## 2024-03-23 ENCOUNTER — Ambulatory Visit: Admitting: Neurology

## 2024-04-06 ENCOUNTER — Encounter: Payer: Self-pay | Admitting: Neurology
